# Patient Record
Sex: Male | Born: 1960 | Race: White | Hispanic: No | Smoking: Never smoker
Health system: Southern US, Community
[De-identification: ages and names within clinical notes are randomized; demographics above are authoritative.]

## PROBLEM LIST (undated history)

## (undated) DIAGNOSIS — E785 Hyperlipidemia, unspecified: Secondary | ICD-10-CM

## (undated) DIAGNOSIS — Z9889 Other specified postprocedural states: Secondary | ICD-10-CM

## (undated) DIAGNOSIS — N400 Enlarged prostate without lower urinary tract symptoms: Secondary | ICD-10-CM

## (undated) DIAGNOSIS — N529 Male erectile dysfunction, unspecified: Secondary | ICD-10-CM

## (undated) DIAGNOSIS — Z9289 Personal history of other medical treatment: Secondary | ICD-10-CM

## (undated) DIAGNOSIS — E291 Testicular hypofunction: Secondary | ICD-10-CM

## (undated) DIAGNOSIS — M545 Low back pain: Secondary | ICD-10-CM

## (undated) DIAGNOSIS — Z973 Presence of spectacles and contact lenses: Secondary | ICD-10-CM

## (undated) DIAGNOSIS — G8929 Other chronic pain: Secondary | ICD-10-CM

## (undated) DIAGNOSIS — J309 Allergic rhinitis, unspecified: Secondary | ICD-10-CM

## (undated) DIAGNOSIS — M75101 Unspecified rotator cuff tear or rupture of right shoulder, not specified as traumatic: Secondary | ICD-10-CM

## (undated) HISTORY — DX: Low back pain: M54.5

## (undated) HISTORY — DX: Other specified postprocedural states: Z98.890

## (undated) HISTORY — PX: WISDOM TOOTH EXTRACTION: SHX21

## (undated) HISTORY — DX: Allergic rhinitis, unspecified: J30.9

## (undated) HISTORY — DX: Testicular hypofunction: E29.1

## (undated) HISTORY — DX: Hyperlipidemia, unspecified: E78.5

## (undated) HISTORY — DX: Other chronic pain: G89.29

## (undated) HISTORY — DX: Benign prostatic hyperplasia without lower urinary tract symptoms: N40.0

---

## 1999-08-28 ENCOUNTER — Encounter: Payer: Self-pay | Admitting: *Deleted

## 1999-08-28 ENCOUNTER — Ambulatory Visit (HOSPITAL_COMMUNITY): Admission: RE | Admit: 1999-08-28 | Discharge: 1999-08-28 | Payer: Self-pay | Admitting: *Deleted

## 1999-09-06 ENCOUNTER — Encounter (INDEPENDENT_AMBULATORY_CARE_PROVIDER_SITE_OTHER): Payer: Self-pay | Admitting: Specialist

## 1999-09-06 ENCOUNTER — Inpatient Hospital Stay (HOSPITAL_COMMUNITY): Admission: RE | Admit: 1999-09-06 | Discharge: 1999-09-08 | Payer: Self-pay | Admitting: Orthopedic Surgery

## 1999-09-06 ENCOUNTER — Encounter: Payer: Self-pay | Admitting: Orthopedic Surgery

## 2000-07-20 HISTORY — PX: BACK SURGERY: SHX140

## 2000-07-20 HISTORY — PX: LUMBAR DISC SURGERY: SHX700

## 2004-10-28 ENCOUNTER — Ambulatory Visit: Payer: Self-pay

## 2006-11-23 ENCOUNTER — Encounter: Admission: RE | Admit: 2006-11-23 | Discharge: 2006-11-23 | Payer: Self-pay | Admitting: Family Medicine

## 2007-07-11 ENCOUNTER — Encounter: Payer: Self-pay | Admitting: Internal Medicine

## 2007-08-03 ENCOUNTER — Ambulatory Visit: Payer: Self-pay | Admitting: Internal Medicine

## 2007-08-03 DIAGNOSIS — E785 Hyperlipidemia, unspecified: Secondary | ICD-10-CM | POA: Insufficient documentation

## 2007-08-03 DIAGNOSIS — N400 Enlarged prostate without lower urinary tract symptoms: Secondary | ICD-10-CM | POA: Insufficient documentation

## 2007-08-03 DIAGNOSIS — J309 Allergic rhinitis, unspecified: Secondary | ICD-10-CM | POA: Insufficient documentation

## 2007-08-04 LAB — CONVERTED CEMR LAB
ALT: 31 units/L (ref 0–53)
AST: 25 units/L (ref 0–37)
Albumin: 4 g/dL (ref 3.5–5.2)
Alkaline Phosphatase: 70 units/L (ref 39–117)
BUN: 13 mg/dL (ref 6–23)
Bilirubin, Direct: 0.1 mg/dL (ref 0.0–0.3)
CO2: 29 meq/L (ref 19–32)
Calcium: 9.2 mg/dL (ref 8.4–10.5)
Chloride: 102 meq/L (ref 96–112)
Cholesterol: 147 mg/dL (ref 0–200)
Creatinine, Ser: 0.9 mg/dL (ref 0.4–1.5)
GFR calc Af Amer: 117 mL/min
GFR calc non Af Amer: 97 mL/min
Glucose, Bld: 68 mg/dL — ABNORMAL LOW (ref 70–99)
HDL: 34.9 mg/dL — ABNORMAL LOW (ref >39.0)
LDL Cholesterol: 83 mg/dL (ref 0–99)
Potassium: 4.4 meq/L (ref 3.5–5.1)
Sodium: 138 meq/L (ref 135–145)
Total Bilirubin: 1 mg/dL (ref 0.3–1.2)
Total CHOL/HDL Ratio: 4.2
Total Protein: 6.4 g/dL (ref 6.0–8.3)
Triglycerides: 144 mg/dL (ref 0–149)
VLDL: 29 mg/dL (ref 0–40)

## 2007-12-30 ENCOUNTER — Telehealth: Payer: Self-pay | Admitting: Internal Medicine

## 2008-02-16 ENCOUNTER — Ambulatory Visit: Payer: Self-pay | Admitting: Internal Medicine

## 2008-02-16 LAB — CONVERTED CEMR LAB
ALT: 28 units/L (ref 0–53)
AST: 27 units/L (ref 0–37)
Albumin: 4.1 g/dL (ref 3.5–5.2)
Alkaline Phosphatase: 63 units/L (ref 39–117)
Bilirubin, Direct: 0.1 mg/dL (ref 0.0–0.3)
Cholesterol: 137 mg/dL (ref 0–200)
HDL: 36.6 mg/dL — ABNORMAL LOW (ref >39.0)
LDL Cholesterol: 91 mg/dL (ref 0–99)
Total Bilirubin: 1 mg/dL (ref 0.3–1.2)
Total CHOL/HDL Ratio: 3.7
Total Protein: 6.7 g/dL (ref 6.0–8.3)
Triglycerides: 47 mg/dL (ref 0–149)
VLDL: 9 mg/dL (ref 0–40)

## 2008-02-22 ENCOUNTER — Ambulatory Visit: Payer: Self-pay | Admitting: Internal Medicine

## 2008-08-22 ENCOUNTER — Ambulatory Visit: Payer: Self-pay | Admitting: Internal Medicine

## 2008-11-30 ENCOUNTER — Encounter: Payer: Self-pay | Admitting: Internal Medicine

## 2008-12-19 ENCOUNTER — Ambulatory Visit: Payer: Self-pay | Admitting: Family Medicine

## 2008-12-27 ENCOUNTER — Telehealth: Payer: Self-pay | Admitting: Family Medicine

## 2009-11-06 ENCOUNTER — Telehealth: Payer: Self-pay | Admitting: Internal Medicine

## 2009-11-29 ENCOUNTER — Ambulatory Visit: Payer: Self-pay | Admitting: Family Medicine

## 2009-12-02 LAB — CONVERTED CEMR LAB
ALT: 28 units/L (ref 0–53)
AST: 28 units/L (ref 0–37)
Albumin: 4.2 g/dL (ref 3.5–5.2)
Alkaline Phosphatase: 72 units/L (ref 39–117)
BUN: 13 mg/dL (ref 6–23)
Basophils Absolute: 0 10*3/uL (ref 0.0–0.1)
Basophils Relative: 0.7 % (ref 0.0–3.0)
Bilirubin, Direct: 0.1 mg/dL (ref 0.0–0.3)
CO2: 27 meq/L (ref 19–32)
Calcium: 9.2 mg/dL (ref 8.4–10.5)
Chloride: 106 meq/L (ref 96–112)
Cholesterol: 158 mg/dL (ref 0–200)
Creatinine, Ser: 0.6 mg/dL (ref 0.4–1.5)
Eosinophils Absolute: 0.3 10*3/uL (ref 0.0–0.7)
Eosinophils Relative: 4.7 % (ref 0.0–5.0)
GFR calc non Af Amer: 146.76 mL/min (ref >60)
Glucose, Bld: 88 mg/dL (ref 70–99)
HCT: 42 % (ref 39.0–52.0)
HDL: 43.1 mg/dL (ref >39.00)
Hemoglobin: 14.3 g/dL (ref 13.0–17.0)
LDL Cholesterol: 97 mg/dL (ref 0–99)
Lymphocytes Relative: 28.1 % (ref 12.0–46.0)
Lymphs Abs: 1.5 10*3/uL (ref 0.7–4.0)
MCHC: 34.2 g/dL (ref 30.0–36.0)
MCV: 91.7 fL (ref 78.0–100.0)
Monocytes Absolute: 0.4 10*3/uL (ref 0.1–1.0)
Monocytes Relative: 7 % (ref 3.0–12.0)
Neutro Abs: 3.3 10*3/uL (ref 1.4–7.7)
Neutrophils Relative %: 59.5 % (ref 43.0–77.0)
Platelets: 209 10*3/uL (ref 150.0–400.0)
Potassium: 4.4 meq/L (ref 3.5–5.1)
RBC: 4.58 M/uL (ref 4.22–5.81)
RDW: 13.2 % (ref 11.5–14.6)
Sodium: 141 meq/L (ref 135–145)
TSH: 1.8 microintl units/mL (ref 0.35–5.50)
Testosterone: 189.08 ng/dL — ABNORMAL LOW (ref 350.00–890.00)
Total Bilirubin: 0.8 mg/dL (ref 0.3–1.2)
Total CHOL/HDL Ratio: 4
Total Protein: 6.7 g/dL (ref 6.0–8.3)
Triglycerides: 89 mg/dL (ref 0.0–149.0)
VLDL: 17.8 mg/dL (ref 0.0–40.0)
WBC: 5.5 10*3/uL (ref 4.5–10.5)

## 2009-12-04 ENCOUNTER — Ambulatory Visit: Payer: Self-pay | Admitting: Family Medicine

## 2009-12-04 DIAGNOSIS — E349 Endocrine disorder, unspecified: Secondary | ICD-10-CM | POA: Insufficient documentation

## 2010-01-22 ENCOUNTER — Telehealth: Payer: Self-pay | Admitting: Internal Medicine

## 2010-01-28 ENCOUNTER — Ambulatory Visit: Payer: Self-pay | Admitting: Internal Medicine

## 2010-01-28 LAB — CONVERTED CEMR LAB
Sex Hormone Binding: 26 nmol/L (ref 13–71)
Testosterone Free: 84.4 pg/mL (ref 47.0–244.0)
Testosterone-% Free: 2.3 % (ref 1.6–2.9)
Testosterone: 367.72 ng/dL (ref 350–890)

## 2010-01-29 LAB — CONVERTED CEMR LAB
FSH: 0.1 milliintl units/mL — ABNORMAL LOW (ref 1.4–18.1)
LH: 0.09 milliintl units/mL — ABNORMAL LOW (ref 1.50–9.30)
PSA: 0.54 ng/mL (ref 0.10–4.00)
Prolactin: 7 ng/mL

## 2010-02-10 ENCOUNTER — Ambulatory Visit: Payer: Self-pay | Admitting: Internal Medicine

## 2010-02-25 ENCOUNTER — Ambulatory Visit: Payer: Self-pay | Admitting: Internal Medicine

## 2010-03-11 ENCOUNTER — Ambulatory Visit: Payer: Self-pay | Admitting: Internal Medicine

## 2010-03-11 DIAGNOSIS — R209 Unspecified disturbances of skin sensation: Secondary | ICD-10-CM | POA: Insufficient documentation

## 2010-03-28 ENCOUNTER — Ambulatory Visit: Payer: Self-pay | Admitting: Internal Medicine

## 2010-05-01 ENCOUNTER — Ambulatory Visit: Payer: Self-pay | Admitting: Internal Medicine

## 2010-05-28 ENCOUNTER — Ambulatory Visit: Payer: Self-pay | Admitting: Internal Medicine

## 2010-06-27 ENCOUNTER — Ambulatory Visit: Payer: Self-pay | Admitting: Internal Medicine

## 2010-08-01 ENCOUNTER — Ambulatory Visit
Admission: RE | Admit: 2010-08-01 | Discharge: 2010-08-01 | Payer: Self-pay | Source: Home / Self Care | Attending: Internal Medicine | Admitting: Internal Medicine

## 2010-08-19 NOTE — Assessment & Plan Note (Signed)
Summary: 2 month rov/et---PT RSC (BMP) // RS   Vital Signs:  Patient profile:   50 year old male Height:      69.5 inches Weight:      169 pounds BMI:     24.69 Temp:     98.5 degrees F oral Pulse rate:   64 / minute Pulse rhythm:   regular BP sitting:   102 / 70  (left arm) Cuff size:   regular  Vitals Entered By: Kern Reap CMA Duncan Dull) (February 10, 2010 11:32 AM)  History of Present Illness:  Follow-Up Visit      This is a 50 year old man who presents for Follow-up visit.  The patient denies chest pain and palpitations.  Since the last visit the patient notes no new problems or concerns.  The patient reports taking meds as prescribed.  When questioned about possible medication side effects, the patient notes none.    All other systems reviewed and were negative   Current Problems (verified): 1)  Testicular Hypofunction  (ICD-257.2) 2)  Fatigue  (ICD-780.79) 3)  Sinusitis- Acute-nos  (ICD-461.9) 4)  Back Pain  (ICD-724.5) 5)  Benign Prostatic Hypertrophy  (ICD-600.00) 6)  Hyperlipidemia  (ICD-272.4) 7)  Allergic Rhinitis  (ICD-477.9)  Allergies (verified): No Known Drug Allergies  Physical Exam  General:  Well-developed,well-nourished,in no acute distress; alert,appropriate and cooperative throughout examination Head:  normocephalic and atraumatic.   Eyes:  pupils equal and pupils round.     Impression & Recommendations:  Problem # 1:  TESTICULAR HYPOFUNCTION (ICD-257.2) much improved  q 6 months PSA  Complete Medication List: 1)  Lovastatin 20 Mg Tabs (Lovastatin) .... Take 1 tablet by mouth once a day 2)  Diclofenac Sodium 75 Mg Tbec (Diclofenac sodium) .... Take 1 tablet by mouth two times a day 3)  Fexofenadine Hcl 180 Mg Tabs (Fexofenadine hcl) .... Take 1 tablet by mouth once a day 4)  Astelin 137 Mcg/spray Soln (Azelastine hcl) .... As needed 5)  Ambien 5 Mg Tabs (Zolpidem tartrate) .... Take 1 tablet by mouth at bedtime as needed insomnia 6)   Testosterone Cypionate 200 Mg/ml Oil (Testosterone cypionate) .Marland Kitchen.. 1cc intramuscularly monthly Prescriptions: TESTOSTERONE CYPIONATE 200 MG/ML OIL (TESTOSTERONE CYPIONATE) 1cc intramuscularly monthly  #10 cc x 0   Entered and Authorized by:   Birdie Sons MD   Signed by:   Birdie Sons MD on 02/10/2010   Method used:   Print then Give to Patient   RxID:   [id number]

## 2010-08-19 NOTE — Assessment & Plan Note (Signed)
Summary: testoserone inj/cdw  Nurse Visit   Allergies: No Known Drug Allergies  Medication Administration  Injection # 1:    Medication: Testosterone Cypionat 200mg  ing    Diagnosis: TESTICULAR HYPOFUNCTION (ICD-257.2)    Route: IM    Site: LUOQ gluteus    Exp Date: 07/2011    Lot #: [id number]    Mfr: Gaylyn Rong    Patient tolerated injection without complications    Given by: Alfred Levins, CMA (June 27, 2010 1:40 PM)  Orders Added: 1)  Admin of patients own med IM/SQ [phone]

## 2010-08-19 NOTE — Assessment & Plan Note (Signed)
Summary: testosterone inj/cjr  Nurse Visit   Allergies: No Known Drug Allergies  Medication Administration  Injection # 1:    Medication: Testosterone Cypionat 200mg  ing    Diagnosis: TESTICULAR HYPOFUNCTION (ICD-257.2)    Route: IM    Site: LUOQ gluteus    Exp Date: 08/20/2011    Lot #: [id number]    Mfr: [facility]    Patient tolerated injection without complications    Given by: Mervin Kung CMA Duncan Dull) (May 01, 2010 11:41 AM)  Orders Added: 1)  Admin of patients own med IM/SQ [phone]

## 2010-08-19 NOTE — Assessment & Plan Note (Signed)
Summary: testosterone/cdw  Nurse Visit   Allergies: No Known Drug Allergies  Medication Administration  Injection # 1:    Medication: Testosterone Cypionat 200mg  ing    Diagnosis: TESTICULAR HYPOFUNCTION (ICD-257.2)    Route: IM    Site: RUOQ gluteus    Exp Date: 07/2011    Lot #: [id number]    Mfr: Gaylyn Rong    Patient tolerated injection without complications    Given by: Alfred Levins, CMA (May 28, 2010 1:39 PM)  Orders Added: 1)  Testosterone Cypionat 200mg  ing [J1080] 2)  Admin of patients own med IM/SQ [[id number]]

## 2010-08-19 NOTE — Progress Notes (Signed)
Summary: question  Phone Note Call from Patient Call back at Work Phone [phone]   Caller: Patient via VM Call For: Birdie Sons MD Summary of Call: Is due FU OV for beginning androglide.  Wants to have lab work done week before.  Wants to know if gets PSA, testosterone, and what else. Initial call taken by: Gladis Riffle, RN,  January 22, 2010 7:43 AM  Follow-up for Phone Call        total and free testosterone prolactin, fsh, lh psa Follow-up by: Birdie Sons MD,  January 23, 2010 7:58 AM  Additional Follow-up for Phone Call Additional follow up Details #1::        Patient notified. Appts made for lab 7/12 and rov 7/18. Additional Follow-up by: Gladis Riffle, RN,  January 23, 2010 11:01 AM

## 2010-08-19 NOTE — Assessment & Plan Note (Signed)
Summary: TEST RESULT F/U O.K. PER DR.//ALP   Vital Signs:  Patient profile:   50 year old male Temp:     98.7 degrees F oral BP sitting:   98 / 62  (left arm) Cuff size:   regular  Vitals Entered By: Sid Falcon LPN (Dec 04, 2009 5:01 PM)  History of Present Illness: Recent issue of progressive fatigue over several months and possibly 2 years. Also symptoms of decrease in stamina, strength, libido, and some erectile dysfunction.  Several recent labs signif for low total testosterone of 189. Pt  instructed to f/u with primary but could not get in anytime soon and scheduled here today.  Allergies (verified): No Known Drug Allergies  Past History:  Past Medical History: Last updated: 08/03/2007 Allergic rhinitis-allergy shots Hyperlipidemia Benign prostatic hypertrophy Chronic back pain  Social History: Last updated: 08/03/2007 Regular exercise-yes-mostly walking/yoga Occupation: [employer] Married Never Smoked Alcohol use-yes  Review of Systems      See HPI  Physical Exam  General:  Well-developed,well-nourished,in no acute distress; alert,appropriate and cooperative throughout examination Lungs:  Normal respiratory effort, chest expands symmetrically. Lungs are clear to auscultation, no crackles or wheezes. Heart:  Normal rate and regular rhythm. S1 and S2 normal without gallop, murmur, click, rub or other extra sounds.   Impression & Recommendations:  Problem # 1:  TESTICULAR HYPOFUNCTION (ICD-257.2) Options discussed with pt.  He has several symptoms likely compatible with testicular hypofunction. Pt wishes to try topical testosterone and will start Androgel pump 4 sprays/day and he will need f/u in 2  months to reassess. Encouraged to get PSA when he gets PE this year.  Complete Medication List: 1)  Lovastatin 20 Mg Tabs (Lovastatin) .... Take 1 tablet by mouth once a day 2)  Diclofenac Sodium 75 Mg Tbec (Diclofenac sodium) .... Take 1 tablet by  mouth two times a day 3)  Fexofenadine Hcl 180 Mg Tabs (Fexofenadine hcl) .... Take 1 tablet by mouth once a day 4)  Astelin 137 Mcg/spray Soln (Azelastine hcl) .... As needed 5)  Ambien 5 Mg Tabs (Zolpidem tartrate) .... Take 1 tablet by mouth at bedtime as needed insomnia 6)  Androgel Pump 1 % Gel (Testosterone) .... 4 sprays per pump and apply daily as instructed.  Patient Instructions: 1)  Please schedule a follow-up appointment in 2 months.  Prescriptions: ANDROGEL PUMP 1 % GEL (TESTOSTERONE) 4 sprays per pump and apply daily as instructed.  #1 x 3   Entered and Authorized by:   Evelena Peat MD   Signed by:   Evelena Peat MD on 12/04/2009   Method used:   Print then Give to Patient   RxID:   [id number]

## 2010-08-19 NOTE — Assessment & Plan Note (Signed)
Summary: test injection - rv  Nurse Visit   Allergies: No Known Drug Allergies  Medication Administration  Injection # 1:    Medication: Testosterone Cypionat 200mg  ing    Diagnosis: TESTICULAR HYPOFUNCTION (ICD-257.2)    Route: IM    Site: RUOQ gluteus    Exp Date: 07/21/2011    Lot #: [id number]    Mfr: Gaylyn Rong     Patient tolerated injection without complications    Given by: Gladis Riffle, RN (February 25, 2010 1:39 PM)  Orders Added: 1)  Admin of patients own med IM/SQ [[id number]]   Medication Administration  Injection # 1:    Medication: Testosterone Cypionat 200mg  ing    Diagnosis: TESTICULAR HYPOFUNCTION (ICD-257.2)    Route: IM    Site: RUOQ gluteus    Exp Date: 07/21/2011    Lot #: [id number]    Mfr: Gaylyn Rong     Patient tolerated injection without complications    Given by: Gladis Riffle, RN (February 25, 2010 1:39 PM)  Orders Added: 1)  Admin of patients own med IM/SQ [phone]

## 2010-08-19 NOTE — Assessment & Plan Note (Signed)
Summary: tingling in hands/dm   Vital Signs:  Patient profile:   50 year old male Weight:      175 pounds BMI:     25.56 Pulse rate:   72 / minute Pulse rhythm:   regular Resp:     12 per minute BP sitting:   100 / 64  (left arm) Cuff size:   regular  Vitals Entered By: Gladis Riffle, RN (March 11, 2010 11:56 AM) CC: c/o tingling left hand x 4 weeks Is Patient Diabetic? No   CC:  c/o tingling left hand x 4 weeks.  History of Present Illness: 3-4 week hx of tingling left hand 1 and 2 fingers---can extend to elbow not painful most noticeable a.m. no shoulder pain he does admit to some left sided neck pain sxs do not wake hem from sleep   no real associated sxs but thinks maybe when sits in meetings for prolonged periods of time he has slight tingling no motor/muscular complaints   otherwise feels well  Preventive Screening-Counseling & Management  Alcohol-Tobacco     Alcohol drinks/day: <1     Smoking Status: never  Current Problems (verified): 1)  Testicular Hypofunction  (ICD-257.2) 2)  Benign Prostatic Hypertrophy  (ICD-600.00) 3)  Hyperlipidemia  (ICD-272.4) 4)  Allergic Rhinitis  (ICD-477.9)  Current Medications (verified): 1)  Lovastatin 20 Mg  Tabs (Lovastatin) .... Take 1 Tablet By Mouth Once A Day 2)  Diclofenac Sodium 75 Mg  Tbec (Diclofenac Sodium) .... Take 1 Tablet By Mouth Two Times A Day 3)  Fexofenadine Hcl 180 Mg  Tabs (Fexofenadine Hcl) .... Take 1 Tablet By Mouth Once A Day 4)  Astelin 137 Mcg/spray  Soln (Azelastine Hcl) .... As Needed 5)  Ambien 5 Mg Tabs (Zolpidem Tartrate) .... Take 1 Tablet By Mouth At Bedtime As Needed Insomnia 6)  Testosterone Cypionate 200 Mg/ml Oil (Testosterone Cypionate) .Marland Kitchen.. 1cc Intramuscularly Monthly  Allergies (verified): No Known Drug Allergies  Past History:  Past Medical History: Last updated: 08/03/2007 Allergic rhinitis-allergy shots Hyperlipidemia Benign prostatic hypertrophy Chronic back  pain  Past Surgical History: Last updated: 08/03/2007 back surgery  ~2002  Family History: Last updated: 08/03/2007 father CAD/CABG-dianosed in 66s grandfather CAD mother deceased-ALS, RA 61yo sisters-healthy paternal grandmother---colon CA  Social History: Last updated: 08/03/2007 Regular exercise-yes-mostly walking/yoga Occupation: [employer] Married Never Smoked Alcohol use-yes  Risk Factors: Alcohol Use: <1 (03/11/2010) Exercise: yes (08/03/2007)  Risk Factors: Smoking Status: never (03/11/2010)  Physical Exam  General:  Well-developed,well-nourished,in no acute distress; alert,appropriate and cooperative throughout examination Head:  normocephalic and atraumatic.   Eyes:  pupils equal and pupils round.   Ears:  R ear normal and L ear normal.   Neck:  No deformities, masses, or tenderness noted. Lungs:  Normal respiratory effort, chest expands symmetrically. Lungs are clear to auscultation, no crackles or wheezes. Heart:  normal rate and regular rhythm.   Msk:  No deformity or scoliosis noted of thoracic or lumbar spine.   Neurologic:  negative Tinel sign Skin:  turgor normal and color normal.     Impression & Recommendations:  Problem # 1:  PARESTHESIA, HANDS (ICD-782.0) possible carpal tunnel syndrome. Advised wrist braces. He will call if symptoms persist. other strong possibility is particular neck pain with paresthesias. No evaluation at this time unless symptoms worsen or he develops motor complaints.  Complete Medication List: 1)  Lovastatin 20 Mg Tabs (Lovastatin) .... Take 1 tablet by mouth once a day 2)  Diclofenac Sodium 75 Mg Tbec (Diclofenac sodium) .Marland KitchenMarland KitchenMarland Kitchen  Take 1 tablet by mouth two times a day 3)  Fexofenadine Hcl 180 Mg Tabs (Fexofenadine hcl) .... Take 1 tablet by mouth once a day 4)  Astelin 137 Mcg/spray Soln (Azelastine hcl) .... As needed 5)  Ambien 5 Mg Tabs (Zolpidem tartrate) .... Take 1 tablet by mouth at bedtime as needed  insomnia 6)  Testosterone Cypionate 200 Mg/ml Oil (Testosterone cypionate) .Marland Kitchen.. 1cc intramuscularly monthly

## 2010-08-19 NOTE — Assessment & Plan Note (Signed)
Summary: TESTOSTERONE INJ/KB  Nurse Visit   Allergies: No Known Drug Allergies  Medication Administration  Injection # 1:    Medication: Testosterone Cypionat 200mg  ing    Diagnosis: TESTICULAR HYPOFUNCTION (ICD-257.2)    Route: IM    Site: RUOQ gluteus    Exp Date: 07/21/2011    Lot #: [id number]    Mfr: Gaylyn Rong    Patient tolerated injection without complications    Given by: Lynann Beaver CMA (March 28, 2010 2:02 PM)  Orders Added: 1)  Admin of patients own med IM/SQ [phone]

## 2010-08-19 NOTE — Progress Notes (Signed)
Summary: Pt has questions re: Ambien CR. Pls call back asap  Phone Note Call from Patient Call back at Work Phone [phone] Call back at [phone] cell   Caller: Patient Summary of Call: Pt called re: Ambien CR. Pt has never had this med and wants to have a call back to discuss.  Initial call taken by: Lucy Antigua,  November 06, 2009 1:48 PM  Follow-up for Phone Call        Getting to go on strange hours for May, June, and July.  Request ambien cr #30/0 to [facility] [address] to cover this.  willing to come for ov if needs. Follow-up by: Gladis Riffle, RN,  November 06, 2009 2:04 PM  Additional Follow-up for Phone Call Additional follow up Details #1::        ambien 5 mg at bedtime as needed insomnia 30/0 Additional Follow-up by: Birdie Sons MD,  November 07, 2009 12:40 PM    Additional Follow-up for Phone Call Additional follow up Details #2::    see Rx. Patient notified.  Follow-up by: Gladis Riffle, RN,  November 07, 2009 1:31 PM  New/Updated Medications: AMBIEN 5 MG TABS (ZOLPIDEM TARTRATE) Take 1 tablet by mouth at bedtime as needed insomnia Prescriptions: AMBIEN 5 MG TABS (ZOLPIDEM TARTRATE) Take 1 tablet by mouth at bedtime as needed insomnia  #30 x 0   Entered by:   Gladis Riffle, RN   Authorized by:   Birdie Sons MD   Signed by:   Gladis Riffle, RN on 11/07/2009   Method used:   Telephoned to ...       [facility]  [address]  [phone]* (retail)       [address]       [place], [place]  [address]       Ph: [phone] or [phone]       Fax: [phone]   RxID:   [id number]

## 2010-08-19 NOTE — Assessment & Plan Note (Signed)
Summary: not feeling well//ccm   Vital Signs:  Patient profile:   50 year old male Temp:     97.9 degrees F oral BP sitting:   110 / 82  (left arm) Cuff size:   regular  Vitals Entered By: Sid Falcon LPN (Nov 29, 2009 8:30 AM) CC: Lethargic, fatigue X 2 years   History of Present Illness: Patient seen with general complaint of fatigue progressive over 2 years.  Patient has been exercising regularly without much improvement and focusing on plenty of sleep and fluid intake. Denies any appetite or weight changes. Has noticed some decreased libido and erectile dysfunction intermittently. Possibly some generalized decrease in strength.  Patient denies depressive symptoms. No increase in stressors.  Chronic problems include history of some chronic back pain and hyperlipidemia. On lovastatin and diclofenac with no recent hepatic panel.  No recent decrease in exercise tolerance. Denies any chest pains or dyspnea.  Allergies (verified): No Known Drug Allergies  Past History:  Past Medical History: Last updated: 08/03/2007 Allergic rhinitis-allergy shots Hyperlipidemia Benign prostatic hypertrophy Chronic back pain  Past Surgical History: Last updated: 08/03/2007 back surgery  ~2002  Family History: Last updated: 08/03/2007 father CAD/CABG-dianosed in 19s grandfather CAD mother deceased-ALS, RA 86yo sisters-healthy paternal grandmother---colon CA  Social History: Last updated: 08/03/2007 Regular exercise-yes-mostly walking/yoga Occupation: [employer] Married Never Smoked Alcohol use-yes  Risk Factors: Alcohol Use: <1 (08/03/2007) Exercise: yes (08/03/2007)  Risk Factors: Smoking Status: never (08/03/2007)  Review of Systems  The patient denies anorexia, fever, weight loss, vision loss, chest pain, syncope, dyspnea on exertion, peripheral edema, prolonged cough, headaches, hemoptysis, abdominal pain, melena, hematochezia, severe indigestion/heartburn,  hematuria, incontinence, suspicious skin lesions, depression, and enlarged lymph nodes.    Physical Exam  General:  Well-developed,well-nourished,in no acute distress; alert,appropriate and cooperative throughout examination Head:  Normocephalic and atraumatic without obvious abnormalities. No apparent alopecia or balding. Eyes:  No corneal or conjunctival inflammation noted. EOMI. Perrla. Funduscopic exam benign, without hemorrhages, exudates or papilledema. Vision grossly normal. Mouth:  Oral mucosa and oropharynx without lesions or exudates.  Teeth in good repair. Neck:  No deformities, masses, or tenderness noted. Lungs:  Normal respiratory effort, chest expands symmetrically. Lungs are clear to auscultation, no crackles or wheezes. Heart:  Normal rate and regular rhythm. S1 and S2 normal without gallop, murmur, click, rub or other extra sounds. Abdomen:  Bowel sounds positive,abdomen soft and non-tender without masses, organomegaly or hernias noted. Extremities:  No clubbing, cyanosis, edema, or deformity noted with normal full range of motion of all joints.   Skin:  Intact without suspicious lesions or rashes Psych:  normally interactive, good eye contact, not anxious appearing, and not depressed appearing.     Impression & Recommendations:  Problem # 1:  FATIGUE (ICD-780.79) rule out metabolic issue.  No evidence for depression. Orders: Venipuncture (56213) TLB-CBC Platelet - w/Differential (85025-CBCD) TLB-BMP (Basic Metabolic Panel-BMET) (80048-METABOL) TLB-TSH (Thyroid Stimulating Hormone) (84443-TSH) TLB-Testosterone, Total (84403-TESTO)  Problem # 2:  HYPERLIPIDEMIA (ICD-272.4)  His updated medication list for this problem includes:    Lovastatin 20 Mg Tabs (Lovastatin) .Marland Kitchen... Take 1 tablet by mouth once a day  Orders: Venipuncture (08657) TLB-Lipid Panel (80061-LIPID) TLB-Hepatic/Liver Function Pnl (80076-HEPATIC)  Complete Medication List: 1)  Lovastatin 20 Mg Tabs  (Lovastatin) .... Take 1 tablet by mouth once a day 2)  Diclofenac Sodium 75 Mg Tbec (Diclofenac sodium) .... Take 1 tablet by mouth two times a day 3)  Fexofenadine Hcl 180 Mg Tabs (Fexofenadine hcl) .... Take 1 tablet  by mouth once a day 4)  Astelin 137 Mcg/spray Soln (Azelastine hcl) .... As needed 5)  Ambien 5 Mg Tabs (Zolpidem tartrate) .... Take 1 tablet by mouth at bedtime as needed insomnia  Patient Instructions: 1)  It is important that you exercise reguarly at least 20 minutes 5 times a week. If you develop chest pain, have severe difficulty breathing, or feel very tired, stop exercising immediately and seek medical attention.

## 2010-08-21 NOTE — Assessment & Plan Note (Signed)
Summary: TESTESTERONE INJ//SLM  Nurse Visit   Allergies: No Known Drug Allergies

## 2010-08-29 ENCOUNTER — Ambulatory Visit: Payer: 59 | Admitting: Internal Medicine

## 2010-08-29 DIAGNOSIS — E349 Endocrine disorder, unspecified: Secondary | ICD-10-CM

## 2010-08-29 DIAGNOSIS — E291 Testicular hypofunction: Secondary | ICD-10-CM

## 2010-08-29 LAB — TESTOSTERONE: Testosterone: 128.64 ng/dL — ABNORMAL LOW (ref 350.00–890.00)

## 2010-08-29 MED ORDER — TESTOSTERONE CYPIONATE 200 MG/ML IM SOLN
200.0000 mg | INTRAMUSCULAR | Status: DC
  Administered 2010-08-29 – 2010-12-16 (×5): 200 mg via INTRAMUSCULAR

## 2010-09-09 NOTE — Progress Notes (Signed)
LMTCB

## 2010-09-20 ENCOUNTER — Encounter: Payer: Self-pay | Admitting: Internal Medicine

## 2010-09-22 ENCOUNTER — Ambulatory Visit (INDEPENDENT_AMBULATORY_CARE_PROVIDER_SITE_OTHER): Payer: 59 | Admitting: Internal Medicine

## 2010-09-22 DIAGNOSIS — E291 Testicular hypofunction: Secondary | ICD-10-CM

## 2010-09-27 ENCOUNTER — Other Ambulatory Visit: Payer: Self-pay | Admitting: Internal Medicine

## 2010-10-13 ENCOUNTER — Ambulatory Visit: Payer: 59 | Admitting: Internal Medicine

## 2010-11-03 ENCOUNTER — Ambulatory Visit (INDEPENDENT_AMBULATORY_CARE_PROVIDER_SITE_OTHER): Payer: 59 | Admitting: Internal Medicine

## 2010-11-03 DIAGNOSIS — E291 Testicular hypofunction: Secondary | ICD-10-CM

## 2010-11-04 ENCOUNTER — Other Ambulatory Visit: Payer: Self-pay | Admitting: Internal Medicine

## 2010-11-24 ENCOUNTER — Ambulatory Visit (INDEPENDENT_AMBULATORY_CARE_PROVIDER_SITE_OTHER): Payer: 59 | Admitting: Internal Medicine

## 2010-11-24 DIAGNOSIS — E291 Testicular hypofunction: Secondary | ICD-10-CM

## 2010-12-16 ENCOUNTER — Ambulatory Visit (INDEPENDENT_AMBULATORY_CARE_PROVIDER_SITE_OTHER): Payer: 59 | Admitting: Internal Medicine

## 2010-12-16 ENCOUNTER — Telehealth: Payer: Self-pay | Admitting: *Deleted

## 2010-12-16 DIAGNOSIS — E349 Endocrine disorder, unspecified: Secondary | ICD-10-CM

## 2010-12-16 DIAGNOSIS — E291 Testicular hypofunction: Secondary | ICD-10-CM

## 2010-12-16 MED ORDER — TESTOSTERONE 20.25 MG/ACT (1.62%) TD GEL: 2.0000 | Freq: Every day | TRANSDERMAL | Status: DC

## 2010-12-16 NOTE — Telephone Encounter (Signed)
Pt would like to go back on the Androgel.  Injections are to expensive

## 2010-12-16 NOTE — Telephone Encounter (Signed)
rx called in, pt aware 

## 2010-12-16 NOTE — Telephone Encounter (Signed)
Ok. androgel-1.62%. 2 pumps applied to shoulders daily

## 2010-12-23 ENCOUNTER — Other Ambulatory Visit: Payer: Self-pay | Admitting: Internal Medicine

## 2011-01-22 ENCOUNTER — Other Ambulatory Visit: Payer: Self-pay | Admitting: Internal Medicine

## 2011-02-24 ENCOUNTER — Other Ambulatory Visit: Payer: Self-pay | Admitting: Internal Medicine

## 2011-05-31 ENCOUNTER — Other Ambulatory Visit: Payer: Self-pay | Admitting: Internal Medicine

## 2011-06-03 ENCOUNTER — Telehealth: Payer: Self-pay | Admitting: Internal Medicine

## 2011-06-03 MED ORDER — DICLOFENAC SODIUM 75 MG PO TBEC: 75.0000 mg | DELAYED_RELEASE_TABLET | Freq: Two times a day (BID) | ORAL | Status: DC

## 2011-06-03 NOTE — Telephone Encounter (Signed)
rx called in to [facility], pt aware

## 2011-06-03 NOTE — Telephone Encounter (Signed)
Pt has schd fasting cpx labs on 06/15/11 and cpx on 06/22/11. Pt is req a refill of diclofenac (VOLTAREN) 75 MG EC tablet to last until his ov. Pt says that either a 30day supply can be called in to [facility] [address] or send full script in to [facility]. Pls call pt and let him know status.

## 2011-06-15 ENCOUNTER — Other Ambulatory Visit (INDEPENDENT_AMBULATORY_CARE_PROVIDER_SITE_OTHER): Payer: 59

## 2011-06-15 DIAGNOSIS — Z0000 Encounter for general adult medical examination without abnormal findings: Secondary | ICD-10-CM

## 2011-06-15 LAB — LIPID PANEL: Total CHOL/HDL Ratio: 4

## 2011-06-15 LAB — POCT URINALYSIS DIPSTICK
Bilirubin, UA: NEGATIVE
Glucose, UA: NEGATIVE
Leukocytes, UA: NEGATIVE
Nitrite, UA: NEGATIVE
pH, UA: 7.5

## 2011-06-15 LAB — BASIC METABOLIC PANEL
Calcium: 9.4 mg/dL (ref 8.4–10.5)
Chloride: 105 mEq/L (ref 96–112)
Creatinine, Ser: 0.9 mg/dL (ref 0.4–1.5)

## 2011-06-15 LAB — CBC WITH DIFFERENTIAL/PLATELET
Basophils Relative: 0.4 % (ref 0.0–3.0)
Eosinophils Relative: 3.2 % (ref 0.0–5.0)
Lymphocytes Relative: 16.9 % (ref 12.0–46.0)
Neutrophils Relative %: 72.6 % (ref 43.0–77.0)
RBC: 4.96 Mil/uL (ref 4.22–5.81)
WBC: 8.8 10*3/uL (ref 4.5–10.5)

## 2011-06-15 LAB — HEPATIC FUNCTION PANEL
ALT: 29 U/L (ref 0–53)
AST: 29 U/L (ref 0–37)
Alkaline Phosphatase: 75 U/L (ref 39–117)
Bilirubin, Direct: 0.2 mg/dL (ref 0.0–0.3)
Total Protein: 7.1 g/dL (ref 6.0–8.3)

## 2011-06-22 ENCOUNTER — Encounter: Payer: Self-pay | Admitting: Internal Medicine

## 2011-06-22 ENCOUNTER — Ambulatory Visit (INDEPENDENT_AMBULATORY_CARE_PROVIDER_SITE_OTHER): Payer: 59 | Admitting: Internal Medicine

## 2011-06-22 VITALS — BP 114/68 | HR 76 | Temp 98.4°F | Ht 69.25 in | Wt 174.0 lb

## 2011-06-22 DIAGNOSIS — Z0000 Encounter for general adult medical examination without abnormal findings: Secondary | ICD-10-CM

## 2011-06-22 DIAGNOSIS — E291 Testicular hypofunction: Secondary | ICD-10-CM

## 2011-06-22 MED ORDER — TESTOSTERONE CYPIONATE 200 MG/ML IM SOLN: 200.0000 mg | INTRAMUSCULAR | Status: DC

## 2011-06-22 NOTE — Assessment & Plan Note (Signed)
Still with low testosterone

## 2011-06-22 NOTE — Progress Notes (Signed)
  Subjective:    Patient ID: Kenneth Knapp, male    DOB: [birth year], 50 y.o.   MRN: [id number]  HPI  cpx  Past Medical History  Diagnosis Date  . ALLERGIC RHINITIS 08/03/2007  . BENIGN PROSTATIC HYPERTROPHY 08/03/2007  . HYPERLIPIDEMIA 08/03/2007  . TESTICULAR HYPOFUNCTION 12/04/2009  . Chronic low back pain    Past Surgical History  Procedure Date  . Back surgery 2002    reports that he has never smoked. He does not have any smokeless tobacco history on file. He reports that he drinks alcohol. His drug history not on file. family history includes ALS in his mother; Arthritis in his mother; Colon cancer in his paternal grandmother; and Heart disease in his father and paternal grandfather. No Known Allergies   Review of Systems  patient denies chest pain, shortness of breath, orthopnea. Denies lower extremity edema, abdominal pain, change in appetite, change in bowel movements. Patient denies rashes, musculoskeletal complaints. No other specific complaints in a complete review of systems.      Objective:   Physical Exam  Well-developed male in no acute distress. HEENT exam atraumatic, normocephalic, extraocular muscles are intact. Conjunctivae are pink without exudate. Neck is supple without lymphadenopathy, thyromegaly, jugular venous distention. Chest is clear to auscultation without increased work of breathing. Cardiac exam S1-S2 are regular. The PMI is normal. No significant murmurs or gallops. Abdominal exam active bowel sounds, soft, nontender. No abdominal bruits. Extremities no clubbing cyanosis or edema. Peripheral pulses are normal without bruits. Neurologic exam alert and oriented without any motor or sensory deficits.       Assessment & Plan:  Well Visit--health maint UTD

## 2011-06-24 ENCOUNTER — Other Ambulatory Visit: Payer: Self-pay | Admitting: Internal Medicine

## 2011-06-29 ENCOUNTER — Ambulatory Visit (INDEPENDENT_AMBULATORY_CARE_PROVIDER_SITE_OTHER): Payer: 59 | Admitting: Internal Medicine

## 2011-06-29 ENCOUNTER — Other Ambulatory Visit: Payer: Self-pay | Admitting: *Deleted

## 2011-06-29 DIAGNOSIS — E291 Testicular hypofunction: Secondary | ICD-10-CM

## 2011-06-29 DIAGNOSIS — E349 Endocrine disorder, unspecified: Secondary | ICD-10-CM

## 2011-06-29 MED ORDER — TESTOSTERONE CYPIONATE 200 MG/ML IM SOLN
200.0000 mg | Freq: Once | INTRAMUSCULAR | Status: AC
  Administered 2011-06-29: 200 mg via INTRAMUSCULAR

## 2011-07-20 ENCOUNTER — Ambulatory Visit (INDEPENDENT_AMBULATORY_CARE_PROVIDER_SITE_OTHER): Payer: 59 | Admitting: *Deleted

## 2011-07-20 DIAGNOSIS — E291 Testicular hypofunction: Secondary | ICD-10-CM

## 2011-07-20 DIAGNOSIS — E349 Endocrine disorder, unspecified: Secondary | ICD-10-CM

## 2011-07-21 HISTORY — PX: COLONOSCOPY: SHX174

## 2011-07-22 ENCOUNTER — Encounter: Payer: Self-pay | Admitting: Internal Medicine

## 2011-07-22 MED ORDER — TESTOSTERONE CYPIONATE 200 MG/ML IM SOLN
200.0000 mg | INTRAMUSCULAR | Status: DC
  Administered 2011-07-20 – 2011-08-10 (×2): 200 mg via INTRAMUSCULAR

## 2011-07-26 ENCOUNTER — Other Ambulatory Visit: Payer: Self-pay | Admitting: Internal Medicine

## 2011-07-27 ENCOUNTER — Other Ambulatory Visit: Payer: Self-pay | Admitting: *Deleted

## 2011-07-27 MED ORDER — ZOLPIDEM TARTRATE 5 MG PO TABS: 5.0000 mg | ORAL_TABLET | Freq: Every evening | ORAL | Status: DC | PRN

## 2011-08-04 ENCOUNTER — Ambulatory Visit (AMBULATORY_SURGERY_CENTER): Payer: 59

## 2011-08-04 ENCOUNTER — Encounter: Payer: Self-pay | Admitting: Internal Medicine

## 2011-08-04 VITALS — Ht 71.0 in | Wt 182.7 lb

## 2011-08-04 DIAGNOSIS — Z1211 Encounter for screening for malignant neoplasm of colon: Secondary | ICD-10-CM

## 2011-08-04 DIAGNOSIS — Z800 Family history of malignant neoplasm of digestive organs: Secondary | ICD-10-CM

## 2011-08-04 MED ORDER — PEG-KCL-NACL-NASULF-NA ASC-C 100 G PO SOLR: 1.0000 | Freq: Once | ORAL | Status: AC

## 2011-08-04 NOTE — Progress Notes (Signed)
Pt came into the office today for his previsit prior to his colonoscopy on 08/17/11 with Dr Rhea Belton. Pt did request to have propofol for sedation instead of Versed and Fentanyl and was already scheduled on a propofol day. The propofol information sheet was given to pt and his questions were answered. Ulis Rias RN

## 2011-08-10 ENCOUNTER — Ambulatory Visit (INDEPENDENT_AMBULATORY_CARE_PROVIDER_SITE_OTHER): Payer: 59 | Admitting: Internal Medicine

## 2011-08-10 DIAGNOSIS — E291 Testicular hypofunction: Secondary | ICD-10-CM

## 2011-08-17 ENCOUNTER — Ambulatory Visit (AMBULATORY_SURGERY_CENTER): Payer: 59 | Admitting: Internal Medicine

## 2011-08-17 ENCOUNTER — Encounter: Payer: Self-pay | Admitting: Internal Medicine

## 2011-08-17 DIAGNOSIS — Z800 Family history of malignant neoplasm of digestive organs: Secondary | ICD-10-CM

## 2011-08-17 DIAGNOSIS — Z1211 Encounter for screening for malignant neoplasm of colon: Secondary | ICD-10-CM

## 2011-08-17 MED ORDER — SODIUM CHLORIDE 0.9 % IV SOLN: 500.0000 mL | INTRAVENOUS | Status: DC

## 2011-08-17 NOTE — Patient Instructions (Signed)
FOLLOW DISCHARGE INSTRUCTIONS (BLUE AND GREEN SHEETS).. 

## 2011-08-17 NOTE — Progress Notes (Signed)
Patient did not experience any of the following events: a burn prior to discharge; a fall within the facility; wrong site/side/patient/procedure/implant event; or a hospital transfer or hospital admission upon discharge from the facility. (G8907) Patient did not have preoperative order for IV antibiotic SSI prophylaxis. (G8918)  

## 2011-08-17 NOTE — Op Note (Signed)
[facility] [address] [place], [place]  [address]  COLONOSCOPY PROCEDURE REPORT  PATIENT:  Kenneth Knapp, Kenneth Knapp  MR#:  [id number] BIRTHDATE:  11/19/1960, 50 yrs. old  GENDER:  male ENDOSCOPIST:  Carie Caddy. Alanys Godino, MD REF. BY:  Birdie Sons, M.D. PROCEDURE DATE:  08/17/2011 PROCEDURE:  Colonoscopy 78295 ASA CLASS:  Class I INDICATIONS:  Routine Risk Screening (1st colonoscopy) MEDICATIONS:   propofol (Diprivan) 150 mg IV, MAC sedation, administered by CRNA  DESCRIPTION OF PROCEDURE:   After the risks benefits and alternatives of the procedure were thoroughly explained, informed consent was obtained.  Digital rectal exam was performed and revealed no rectal masses.   The LB 180AL [id number] endoscope was introduced through the anus and advanced to the cecum, which was identified by both the appendix and ileocecal valve, without limitations.  The quality of the prep was good, using MoviPrep. The instrument was then slowly withdrawn as the colon was fully examined. <<PROCEDUREIMAGES>>  FINDINGS:  Normal colonoscopy without polyps, masses, vascular ectasias, or inflammatory changes.   Retroflexed views in the rectum revealed no abnormalities.   The scope was then withdrawn from the cecum and the procedure completed.  COMPLICATIONS:  None  ENDOSCOPIC IMPRESSION: 1) Normal colonoscopy  RECOMMENDATIONS: 1) You should continue to follow colorectal cancer screening guidelines for "routine risk" patients with a repeat colonoscopy in 10 years. There is no need for FOBT (stool) testing for at least 5 years.  Carie Caddy. Rhea Belton, MD  CC:  [name], MD The Patient  n. eSIGNEDCarie Caddy. Kalijah Westfall at 08/17/2011 08:49 AM  [name], [phone]

## 2011-08-18 ENCOUNTER — Telehealth: Payer: Self-pay | Admitting: *Deleted

## 2011-08-18 NOTE — Telephone Encounter (Signed)
  Follow up Call-  Call back number 08/17/2011  Post procedure Call Back phone  # [phone]  Permission to leave phone message Yes     Patient questions:  Do you have a fever, pain , or abdominal swelling? no Pain Score  0 *  Have you tolerated food without any problems? yes  Have you been able to return to your normal activities? yes  Do you have any questions about your discharge instructions: Diet   no Medications  no Follow up visit  no  Do you have questions or concerns about your Care? no  Actions: * If pain score is 4 or above: No action needed, pain <4.

## 2011-08-31 ENCOUNTER — Ambulatory Visit (INDEPENDENT_AMBULATORY_CARE_PROVIDER_SITE_OTHER): Payer: 59 | Admitting: Internal Medicine

## 2011-08-31 ENCOUNTER — Ambulatory Visit: Payer: 59 | Admitting: Internal Medicine

## 2011-08-31 DIAGNOSIS — E291 Testicular hypofunction: Secondary | ICD-10-CM

## 2011-08-31 DIAGNOSIS — E349 Endocrine disorder, unspecified: Secondary | ICD-10-CM

## 2011-08-31 MED ORDER — TESTOSTERONE CYPIONATE 200 MG/ML IM SOLN
200.0000 mg | INTRAMUSCULAR | Status: DC
  Administered 2011-08-31 – 2011-11-03 (×4): 200 mg via INTRAMUSCULAR

## 2011-09-21 ENCOUNTER — Ambulatory Visit (INDEPENDENT_AMBULATORY_CARE_PROVIDER_SITE_OTHER): Payer: 59 | Admitting: Internal Medicine

## 2011-09-21 DIAGNOSIS — E291 Testicular hypofunction: Secondary | ICD-10-CM

## 2011-10-04 ENCOUNTER — Other Ambulatory Visit: Payer: Self-pay | Admitting: Internal Medicine

## 2011-10-12 ENCOUNTER — Ambulatory Visit (INDEPENDENT_AMBULATORY_CARE_PROVIDER_SITE_OTHER): Payer: 59 | Admitting: Internal Medicine

## 2011-10-12 DIAGNOSIS — E291 Testicular hypofunction: Secondary | ICD-10-CM

## 2011-11-03 ENCOUNTER — Ambulatory Visit (INDEPENDENT_AMBULATORY_CARE_PROVIDER_SITE_OTHER): Payer: 59 | Admitting: Internal Medicine

## 2011-11-03 DIAGNOSIS — E291 Testicular hypofunction: Secondary | ICD-10-CM

## 2011-11-03 MED ORDER — TESTOSTERONE CYPIONATE 200 MG/ML IM SOLN: 200.0000 mg | INTRAMUSCULAR | Status: DC

## 2011-11-23 ENCOUNTER — Ambulatory Visit (INDEPENDENT_AMBULATORY_CARE_PROVIDER_SITE_OTHER): Payer: 59 | Admitting: Internal Medicine

## 2011-11-23 DIAGNOSIS — E349 Endocrine disorder, unspecified: Secondary | ICD-10-CM

## 2011-11-23 DIAGNOSIS — E291 Testicular hypofunction: Secondary | ICD-10-CM

## 2011-11-23 MED ORDER — TESTOSTERONE CYPIONATE 200 MG/ML IM SOLN
200.0000 mg | INTRAMUSCULAR | Status: DC
  Administered 2011-11-23 – 2012-02-16 (×5): 200 mg via INTRAMUSCULAR

## 2011-11-24 ENCOUNTER — Ambulatory Visit: Payer: 59 | Admitting: Internal Medicine

## 2011-12-11 ENCOUNTER — Ambulatory Visit: Payer: 59 | Admitting: Internal Medicine

## 2011-12-11 ENCOUNTER — Ambulatory Visit (INDEPENDENT_AMBULATORY_CARE_PROVIDER_SITE_OTHER): Payer: 59 | Admitting: *Deleted

## 2011-12-11 DIAGNOSIS — E291 Testicular hypofunction: Secondary | ICD-10-CM

## 2012-01-04 ENCOUNTER — Ambulatory Visit (INDEPENDENT_AMBULATORY_CARE_PROVIDER_SITE_OTHER): Payer: 59 | Admitting: Internal Medicine

## 2012-01-04 DIAGNOSIS — E291 Testicular hypofunction: Secondary | ICD-10-CM

## 2012-01-26 ENCOUNTER — Ambulatory Visit (INDEPENDENT_AMBULATORY_CARE_PROVIDER_SITE_OTHER): Payer: 59 | Admitting: Internal Medicine

## 2012-01-26 DIAGNOSIS — E291 Testicular hypofunction: Secondary | ICD-10-CM

## 2012-02-16 ENCOUNTER — Ambulatory Visit (INDEPENDENT_AMBULATORY_CARE_PROVIDER_SITE_OTHER): Payer: 59 | Admitting: *Deleted

## 2012-02-16 DIAGNOSIS — E291 Testicular hypofunction: Secondary | ICD-10-CM

## 2012-03-09 ENCOUNTER — Ambulatory Visit: Payer: 59 | Admitting: Internal Medicine

## 2012-03-10 ENCOUNTER — Ambulatory Visit: Payer: 59 | Admitting: *Deleted

## 2012-03-29 ENCOUNTER — Ambulatory Visit (INDEPENDENT_AMBULATORY_CARE_PROVIDER_SITE_OTHER): Payer: 59 | Admitting: *Deleted

## 2012-03-29 DIAGNOSIS — E291 Testicular hypofunction: Secondary | ICD-10-CM

## 2012-03-29 MED ORDER — TESTOSTERONE CYPIONATE 200 MG/ML IM SOLN
200.0000 mg | INTRAMUSCULAR | Status: DC
  Administered 2012-03-29: 200 mg via INTRAMUSCULAR

## 2012-04-07 ENCOUNTER — Other Ambulatory Visit: Payer: Self-pay | Admitting: Internal Medicine

## 2012-04-11 ENCOUNTER — Other Ambulatory Visit: Payer: Self-pay | Admitting: *Deleted

## 2012-04-11 MED ORDER — DICLOFENAC SODIUM 75 MG PO TBEC: 75.0000 mg | DELAYED_RELEASE_TABLET | Freq: Two times a day (BID) | ORAL | Status: DC

## 2012-04-11 MED ORDER — LOVASTATIN 20 MG PO TABS: 20.0000 mg | ORAL_TABLET | Freq: Every day | ORAL | Status: DC

## 2012-04-18 ENCOUNTER — Ambulatory Visit (INDEPENDENT_AMBULATORY_CARE_PROVIDER_SITE_OTHER): Payer: 59 | Admitting: Internal Medicine

## 2012-04-18 DIAGNOSIS — E291 Testicular hypofunction: Secondary | ICD-10-CM

## 2012-04-18 MED ORDER — TESTOSTERONE CYPIONATE 200 MG/ML IM SOLN
200.0000 mg | INTRAMUSCULAR | Status: DC
  Administered 2012-04-18: 200 mg via INTRAMUSCULAR

## 2012-05-10 ENCOUNTER — Ambulatory Visit (INDEPENDENT_AMBULATORY_CARE_PROVIDER_SITE_OTHER): Payer: 59 | Admitting: *Deleted

## 2012-05-10 DIAGNOSIS — E291 Testicular hypofunction: Secondary | ICD-10-CM

## 2012-05-10 MED ORDER — TESTOSTERONE CYPIONATE 200 MG/ML IM SOLN
200.0000 mg | INTRAMUSCULAR | Status: DC
  Administered 2012-05-10: 200 mg via INTRAMUSCULAR

## 2012-05-10 MED ORDER — TESTOSTERONE CYPIONATE 200 MG/ML IM SOLN: 200.0000 mg | INTRAMUSCULAR | Status: DC

## 2012-06-01 ENCOUNTER — Ambulatory Visit (INDEPENDENT_AMBULATORY_CARE_PROVIDER_SITE_OTHER): Payer: 59 | Admitting: *Deleted

## 2012-06-01 DIAGNOSIS — E291 Testicular hypofunction: Secondary | ICD-10-CM

## 2012-06-01 DIAGNOSIS — E349 Endocrine disorder, unspecified: Secondary | ICD-10-CM

## 2012-06-01 MED ORDER — TESTOSTERONE CYPIONATE 200 MG/ML IM SOLN
200.0000 mg | INTRAMUSCULAR | Status: DC
  Administered 2012-06-01: 200 mg via INTRAMUSCULAR

## 2012-06-21 ENCOUNTER — Other Ambulatory Visit: Payer: Self-pay | Admitting: Internal Medicine

## 2012-06-23 ENCOUNTER — Ambulatory Visit (INDEPENDENT_AMBULATORY_CARE_PROVIDER_SITE_OTHER): Payer: 59 | Admitting: Internal Medicine

## 2012-06-23 DIAGNOSIS — E291 Testicular hypofunction: Secondary | ICD-10-CM

## 2012-06-23 MED ORDER — TESTOSTERONE CYPIONATE 200 MG/ML IM SOLN
200.0000 mg | INTRAMUSCULAR | Status: AC
  Administered 2012-06-23: 200 mg via INTRAMUSCULAR

## 2012-07-08 ENCOUNTER — Ambulatory Visit (INDEPENDENT_AMBULATORY_CARE_PROVIDER_SITE_OTHER): Payer: 59 | Admitting: *Deleted

## 2012-07-08 DIAGNOSIS — E291 Testicular hypofunction: Secondary | ICD-10-CM

## 2012-07-08 MED ORDER — TESTOSTERONE CYPIONATE 200 MG/ML IM SOLN
200.0000 mg | INTRAMUSCULAR | Status: DC
  Administered 2012-07-08: 200 mg via INTRAMUSCULAR

## 2012-08-05 ENCOUNTER — Ambulatory Visit (INDEPENDENT_AMBULATORY_CARE_PROVIDER_SITE_OTHER): Payer: 59 | Admitting: Internal Medicine

## 2012-08-05 DIAGNOSIS — E349 Endocrine disorder, unspecified: Secondary | ICD-10-CM

## 2012-08-05 DIAGNOSIS — E291 Testicular hypofunction: Secondary | ICD-10-CM

## 2012-08-05 MED ORDER — TESTOSTERONE CYPIONATE 200 MG/ML IM SOLN
200.0000 mg | INTRAMUSCULAR | Status: AC
  Administered 2012-08-05: 200 mg via INTRAMUSCULAR

## 2012-08-23 ENCOUNTER — Ambulatory Visit (INDEPENDENT_AMBULATORY_CARE_PROVIDER_SITE_OTHER): Payer: 59 | Admitting: *Deleted

## 2012-08-23 DIAGNOSIS — E291 Testicular hypofunction: Secondary | ICD-10-CM

## 2012-08-23 MED ORDER — TESTOSTERONE CYPIONATE 200 MG/ML IM SOLN
200.0000 mg | INTRAMUSCULAR | Status: DC
  Administered 2012-08-23: 200 mg via INTRAMUSCULAR

## 2012-09-06 ENCOUNTER — Other Ambulatory Visit: Payer: Self-pay | Admitting: Internal Medicine

## 2012-09-16 ENCOUNTER — Ambulatory Visit (INDEPENDENT_AMBULATORY_CARE_PROVIDER_SITE_OTHER): Payer: 59 | Admitting: Internal Medicine

## 2012-09-16 DIAGNOSIS — E291 Testicular hypofunction: Secondary | ICD-10-CM

## 2012-09-16 MED ORDER — TESTOSTERONE CYPIONATE 200 MG/ML IM SOLN
200.0000 mg | INTRAMUSCULAR | Status: DC
  Administered 2012-09-16: 200 mg via INTRAMUSCULAR

## 2012-10-13 ENCOUNTER — Ambulatory Visit (INDEPENDENT_AMBULATORY_CARE_PROVIDER_SITE_OTHER): Payer: 59 | Admitting: *Deleted

## 2012-10-13 DIAGNOSIS — E291 Testicular hypofunction: Secondary | ICD-10-CM

## 2012-10-13 MED ORDER — TESTOSTERONE CYPIONATE 200 MG/ML IM SOLN
200.0000 mg | INTRAMUSCULAR | Status: AC
  Administered 2012-10-13: 200 mg via INTRAMUSCULAR

## 2012-10-19 ENCOUNTER — Other Ambulatory Visit: Payer: Self-pay | Admitting: *Deleted

## 2012-10-19 MED ORDER — LOVASTATIN 20 MG PO TABS: 20.0000 mg | ORAL_TABLET | Freq: Every day | ORAL | Status: DC

## 2012-11-07 ENCOUNTER — Encounter: Payer: Self-pay | Admitting: *Deleted

## 2012-11-07 ENCOUNTER — Ambulatory Visit (INDEPENDENT_AMBULATORY_CARE_PROVIDER_SITE_OTHER): Payer: 59 | Admitting: *Deleted

## 2012-11-07 DIAGNOSIS — E291 Testicular hypofunction: Secondary | ICD-10-CM

## 2012-11-07 MED ORDER — TESTOSTERONE CYPIONATE 200 MG/ML IM SOLN
200.0000 mg | Freq: Once | INTRAMUSCULAR | Status: AC
  Administered 2012-11-07: 200 mg via INTRAMUSCULAR

## 2012-11-27 ENCOUNTER — Other Ambulatory Visit: Payer: Self-pay | Admitting: Internal Medicine

## 2012-12-02 ENCOUNTER — Ambulatory Visit (INDEPENDENT_AMBULATORY_CARE_PROVIDER_SITE_OTHER): Payer: 59 | Admitting: *Deleted

## 2012-12-02 DIAGNOSIS — E291 Testicular hypofunction: Secondary | ICD-10-CM

## 2012-12-02 MED ORDER — TESTOSTERONE CYPIONATE 200 MG/ML IM SOLN
200.0000 mg | INTRAMUSCULAR | Status: DC
  Administered 2012-12-02: 200 mg via INTRAMUSCULAR

## 2012-12-03 ENCOUNTER — Other Ambulatory Visit: Payer: Self-pay | Admitting: Internal Medicine

## 2012-12-14 ENCOUNTER — Other Ambulatory Visit: Payer: Self-pay | Admitting: *Deleted

## 2012-12-14 MED ORDER — ZOLPIDEM TARTRATE 5 MG PO TABS: ORAL_TABLET | ORAL | Status: DC

## 2012-12-22 ENCOUNTER — Ambulatory Visit (INDEPENDENT_AMBULATORY_CARE_PROVIDER_SITE_OTHER): Payer: 59 | Admitting: *Deleted

## 2012-12-22 DIAGNOSIS — E291 Testicular hypofunction: Secondary | ICD-10-CM

## 2012-12-22 MED ORDER — TESTOSTERONE CYPIONATE 200 MG/ML IM SOLN
200.0000 mg | Freq: Once | INTRAMUSCULAR | Status: AC
  Administered 2012-12-22: 200 mg via INTRAMUSCULAR

## 2012-12-22 MED ORDER — TESTOSTERONE CYPIONATE 200 MG/ML IM SOLN: 200.0000 mg | INTRAMUSCULAR | Status: DC

## 2013-01-12 ENCOUNTER — Ambulatory Visit (INDEPENDENT_AMBULATORY_CARE_PROVIDER_SITE_OTHER): Payer: 59 | Admitting: Internal Medicine

## 2013-01-12 DIAGNOSIS — E291 Testicular hypofunction: Secondary | ICD-10-CM

## 2013-01-12 MED ORDER — TESTOSTERONE CYPIONATE 200 MG/ML IM SOLN
200.0000 mg | Freq: Once | INTRAMUSCULAR | Status: AC
  Administered 2013-01-12: 200 mg via INTRAMUSCULAR

## 2013-02-09 ENCOUNTER — Ambulatory Visit (INDEPENDENT_AMBULATORY_CARE_PROVIDER_SITE_OTHER): Payer: 59 | Admitting: *Deleted

## 2013-02-09 DIAGNOSIS — E349 Endocrine disorder, unspecified: Secondary | ICD-10-CM

## 2013-02-09 DIAGNOSIS — E291 Testicular hypofunction: Secondary | ICD-10-CM

## 2013-02-09 MED ORDER — TESTOSTERONE CYPIONATE 200 MG/ML IM SOLN
200.0000 mg | INTRAMUSCULAR | Status: DC
  Administered 2013-02-09: 200 mg via INTRAMUSCULAR

## 2013-02-13 ENCOUNTER — Encounter: Payer: Self-pay | Admitting: Internal Medicine

## 2013-02-13 ENCOUNTER — Ambulatory Visit (INDEPENDENT_AMBULATORY_CARE_PROVIDER_SITE_OTHER): Payer: 59 | Admitting: Internal Medicine

## 2013-02-13 VITALS — BP 96/70 | HR 76 | Temp 97.9°F | Wt 176.0 lb

## 2013-02-13 DIAGNOSIS — E785 Hyperlipidemia, unspecified: Secondary | ICD-10-CM

## 2013-02-13 DIAGNOSIS — E291 Testicular hypofunction: Secondary | ICD-10-CM

## 2013-02-13 LAB — HEPATIC FUNCTION PANEL
Alkaline Phosphatase: 58 U/L (ref 39–117)
Bilirubin, Direct: 0.2 mg/dL (ref 0.0–0.3)
Total Bilirubin: 1.1 mg/dL (ref 0.3–1.2)

## 2013-02-13 LAB — PSA: PSA: 1.12 ng/mL (ref 0.10–4.00)

## 2013-02-13 LAB — TESTOSTERONE: Testosterone: 550.63 ng/dL (ref 350.00–890.00)

## 2013-02-13 LAB — LIPID PANEL
LDL Cholesterol: 89 mg/dL (ref 0–99)
VLDL: 22.2 mg/dL (ref 0.0–40.0)

## 2013-02-13 MED ORDER — DICLOFENAC SODIUM 75 MG PO TBEC: DELAYED_RELEASE_TABLET | ORAL | Status: DC

## 2013-02-13 NOTE — Assessment & Plan Note (Signed)
Check laboratory work today. Continue same medications.

## 2013-02-13 NOTE — Assessment & Plan Note (Signed)
Tolerating replacement therapy. We'll check PSA and testosterone level today.

## 2013-02-13 NOTE — Progress Notes (Signed)
Patient with history of hyperlipidemia and testicular hypofunction. He's been receiving testosterone injections and has been doing well. He is also tolerating lovastatin.  He is due for laboratory work.  Reviewed past medical history, family history, social history, medications.  Review of systems: Unremarkable  Physical exam vital signs reviewed. Chest clear to auscultation cardiac exam S1-S2 are regular. Abdominal exam and bowel sounds, soft. Extremities no edema.

## 2013-02-14 ENCOUNTER — Other Ambulatory Visit: Payer: Self-pay | Admitting: Internal Medicine

## 2013-02-16 ENCOUNTER — Other Ambulatory Visit (INDEPENDENT_AMBULATORY_CARE_PROVIDER_SITE_OTHER): Payer: Self-pay

## 2013-02-16 ENCOUNTER — Other Ambulatory Visit: Payer: Self-pay | Admitting: Internal Medicine

## 2013-02-16 MED ORDER — ZOLPIDEM TARTRATE 5 MG PO TABS: ORAL_TABLET | ORAL | Status: DC

## 2013-03-01 ENCOUNTER — Ambulatory Visit (INDEPENDENT_AMBULATORY_CARE_PROVIDER_SITE_OTHER): Payer: 59 | Admitting: *Deleted

## 2013-03-01 DIAGNOSIS — E291 Testicular hypofunction: Secondary | ICD-10-CM

## 2013-03-01 MED ORDER — TESTOSTERONE CYPIONATE 200 MG/ML IM SOLN
200.0000 mg | Freq: Once | INTRAMUSCULAR | Status: AC
  Administered 2013-03-01: 200 mg via INTRAMUSCULAR

## 2013-03-23 ENCOUNTER — Ambulatory Visit (INDEPENDENT_AMBULATORY_CARE_PROVIDER_SITE_OTHER): Payer: 59 | Admitting: *Deleted

## 2013-03-23 DIAGNOSIS — E291 Testicular hypofunction: Secondary | ICD-10-CM

## 2013-03-23 DIAGNOSIS — E349 Endocrine disorder, unspecified: Secondary | ICD-10-CM

## 2013-03-23 MED ORDER — TESTOSTERONE CYPIONATE 200 MG/ML IM SOLN
200.0000 mg | Freq: Once | INTRAMUSCULAR | Status: AC
  Administered 2013-03-23: 200 mg via INTRAMUSCULAR

## 2013-04-14 ENCOUNTER — Ambulatory Visit (INDEPENDENT_AMBULATORY_CARE_PROVIDER_SITE_OTHER): Payer: 59 | Admitting: *Deleted

## 2013-04-14 DIAGNOSIS — E349 Endocrine disorder, unspecified: Secondary | ICD-10-CM

## 2013-04-14 DIAGNOSIS — E291 Testicular hypofunction: Secondary | ICD-10-CM

## 2013-04-14 MED ORDER — TESTOSTERONE CYPIONATE 200 MG/ML IM SOLN
200.0000 mg | INTRAMUSCULAR | Status: DC
  Administered 2013-04-14: 200 mg via INTRAMUSCULAR

## 2013-05-03 ENCOUNTER — Ambulatory Visit (INDEPENDENT_AMBULATORY_CARE_PROVIDER_SITE_OTHER): Payer: 59 | Admitting: *Deleted

## 2013-05-03 DIAGNOSIS — E349 Endocrine disorder, unspecified: Secondary | ICD-10-CM

## 2013-05-03 DIAGNOSIS — E291 Testicular hypofunction: Secondary | ICD-10-CM

## 2013-05-03 MED ORDER — TESTOSTERONE CYPIONATE 200 MG/ML IM SOLN
200.0000 mg | Freq: Once | INTRAMUSCULAR | Status: AC
  Administered 2013-05-03: 200 mg via INTRAMUSCULAR

## 2013-05-25 ENCOUNTER — Other Ambulatory Visit: Payer: Self-pay

## 2013-05-26 ENCOUNTER — Ambulatory Visit (INDEPENDENT_AMBULATORY_CARE_PROVIDER_SITE_OTHER): Payer: 59 | Admitting: Internal Medicine

## 2013-05-26 DIAGNOSIS — E291 Testicular hypofunction: Secondary | ICD-10-CM

## 2013-05-26 DIAGNOSIS — E349 Endocrine disorder, unspecified: Secondary | ICD-10-CM

## 2013-05-26 MED ORDER — TESTOSTERONE CYPIONATE 200 MG/ML IM SOLN
200.0000 mg | Freq: Once | INTRAMUSCULAR | Status: AC
  Administered 2013-05-26: 200 mg via INTRAMUSCULAR

## 2013-06-29 ENCOUNTER — Ambulatory Visit (INDEPENDENT_AMBULATORY_CARE_PROVIDER_SITE_OTHER): Payer: 59 | Admitting: Internal Medicine

## 2013-06-29 DIAGNOSIS — E349 Endocrine disorder, unspecified: Secondary | ICD-10-CM

## 2013-06-29 DIAGNOSIS — E291 Testicular hypofunction: Secondary | ICD-10-CM

## 2013-06-29 MED ORDER — TESTOSTERONE CYPIONATE 200 MG/ML IM SOLN
200.0000 mg | Freq: Once | INTRAMUSCULAR | Status: AC
  Administered 2013-06-29: 200 mg via INTRAMUSCULAR

## 2013-07-25 ENCOUNTER — Ambulatory Visit (INDEPENDENT_AMBULATORY_CARE_PROVIDER_SITE_OTHER): Payer: 59 | Admitting: Internal Medicine

## 2013-07-25 DIAGNOSIS — E349 Endocrine disorder, unspecified: Secondary | ICD-10-CM

## 2013-07-25 DIAGNOSIS — E291 Testicular hypofunction: Secondary | ICD-10-CM

## 2013-07-25 MED ORDER — TESTOSTERONE CYPIONATE 200 MG/ML IM SOLN
200.0000 mg | INTRAMUSCULAR | Status: DC
  Administered 2013-07-25: 200 mg via INTRAMUSCULAR

## 2013-08-09 ENCOUNTER — Other Ambulatory Visit: Payer: Self-pay | Admitting: Internal Medicine

## 2013-08-14 ENCOUNTER — Ambulatory Visit (INDEPENDENT_AMBULATORY_CARE_PROVIDER_SITE_OTHER): Payer: 59 | Admitting: *Deleted

## 2013-08-14 ENCOUNTER — Other Ambulatory Visit: Payer: Self-pay | Admitting: Internal Medicine

## 2013-08-14 DIAGNOSIS — E291 Testicular hypofunction: Secondary | ICD-10-CM

## 2013-08-14 LAB — TESTOSTERONE: TESTOSTERONE: 214.88 ng/dL — AB (ref 350.00–890.00)

## 2013-08-14 MED ORDER — TESTOSTERONE CYPIONATE 200 MG/ML IM SOLN
200.0000 mg | INTRAMUSCULAR | Status: DC
Start: 2013-08-14 — End: 2013-08-14
  Administered 2013-08-14: 200 mg via INTRAMUSCULAR

## 2013-08-14 MED ORDER — TESTOSTERONE CYPIONATE 200 MG/ML IM SOLN: 200.0000 mg | INTRAMUSCULAR | Status: DC

## 2013-09-05 ENCOUNTER — Ambulatory Visit: Payer: 59 | Admitting: *Deleted

## 2013-09-07 ENCOUNTER — Ambulatory Visit (INDEPENDENT_AMBULATORY_CARE_PROVIDER_SITE_OTHER): Payer: 59 | Admitting: *Deleted

## 2013-09-07 DIAGNOSIS — E349 Endocrine disorder, unspecified: Secondary | ICD-10-CM

## 2013-09-07 DIAGNOSIS — E291 Testicular hypofunction: Secondary | ICD-10-CM

## 2013-09-07 MED ORDER — TESTOSTERONE CYPIONATE 200 MG/ML IM SOLN: 200.0000 mg | INTRAMUSCULAR | Status: DC

## 2013-09-07 MED ORDER — TESTOSTERONE CYPIONATE 200 MG/ML IM SOLN
200.0000 mg | Freq: Once | INTRAMUSCULAR | Status: AC
  Administered 2013-09-07: 200 mg via INTRAMUSCULAR

## 2013-09-25 ENCOUNTER — Ambulatory Visit (INDEPENDENT_AMBULATORY_CARE_PROVIDER_SITE_OTHER): Payer: 59 | Admitting: *Deleted

## 2013-09-25 DIAGNOSIS — E291 Testicular hypofunction: Secondary | ICD-10-CM

## 2013-09-25 MED ORDER — TESTOSTERONE CYPIONATE 200 MG/ML IM SOLN
200.0000 mg | Freq: Once | INTRAMUSCULAR | Status: AC
  Administered 2013-09-25: 200 mg via INTRAMUSCULAR

## 2013-09-27 ENCOUNTER — Other Ambulatory Visit: Payer: Self-pay | Admitting: Internal Medicine

## 2013-09-28 ENCOUNTER — Other Ambulatory Visit: Payer: Self-pay | Admitting: Internal Medicine

## 2013-10-17 ENCOUNTER — Other Ambulatory Visit: Payer: Self-pay | Admitting: *Deleted

## 2013-10-17 MED ORDER — ZOLPIDEM TARTRATE 5 MG PO TABS: ORAL_TABLET | ORAL | Status: DC

## 2013-10-24 ENCOUNTER — Ambulatory Visit (INDEPENDENT_AMBULATORY_CARE_PROVIDER_SITE_OTHER): Payer: 59 | Admitting: *Deleted

## 2013-10-24 DIAGNOSIS — R7989 Other specified abnormal findings of blood chemistry: Secondary | ICD-10-CM

## 2013-10-24 DIAGNOSIS — E291 Testicular hypofunction: Secondary | ICD-10-CM

## 2013-10-24 MED ORDER — TESTOSTERONE CYPIONATE 100 MG/ML IM SOLN
200.0000 mg | Freq: Once | INTRAMUSCULAR | Status: AC
  Administered 2013-10-24: 200 mg via INTRAMUSCULAR

## 2013-11-15 ENCOUNTER — Ambulatory Visit (INDEPENDENT_AMBULATORY_CARE_PROVIDER_SITE_OTHER): Payer: 59 | Admitting: *Deleted

## 2013-11-15 DIAGNOSIS — E349 Endocrine disorder, unspecified: Secondary | ICD-10-CM

## 2013-11-15 DIAGNOSIS — E291 Testicular hypofunction: Secondary | ICD-10-CM

## 2013-11-15 MED ORDER — TESTOSTERONE CYPIONATE 200 MG/ML IM SOLN
200.0000 mg | Freq: Once | INTRAMUSCULAR | Status: AC
  Administered 2013-11-15: 200 mg via INTRAMUSCULAR

## 2013-11-30 ENCOUNTER — Encounter: Payer: Self-pay | Admitting: Internal Medicine

## 2013-11-30 ENCOUNTER — Ambulatory Visit (INDEPENDENT_AMBULATORY_CARE_PROVIDER_SITE_OTHER): Payer: 59 | Admitting: Internal Medicine

## 2013-11-30 VITALS — BP 116/80 | Temp 98.3°F | Ht 69.5 in | Wt 174.0 lb

## 2013-11-30 DIAGNOSIS — J029 Acute pharyngitis, unspecified: Secondary | ICD-10-CM

## 2013-11-30 DIAGNOSIS — J069 Acute upper respiratory infection, unspecified: Secondary | ICD-10-CM

## 2013-11-30 DIAGNOSIS — Z2089 Contact with and (suspected) exposure to other communicable diseases: Secondary | ICD-10-CM

## 2013-11-30 DIAGNOSIS — Z20818 Contact with and (suspected) exposure to other bacterial communicable diseases: Secondary | ICD-10-CM

## 2013-11-30 DIAGNOSIS — B9789 Other viral agents as the cause of diseases classified elsewhere: Secondary | ICD-10-CM

## 2013-11-30 LAB — POCT RAPID STREP A (OFFICE): Rapid Strep A Screen: NEGATIVE

## 2013-11-30 NOTE — Progress Notes (Signed)
Pre visit review using our clinic review tool, if applicable. No additional management support is needed unless otherwise documented below in the visit note.   Chief Complaint  Patient presents with  . Sore Throat    Started three days ago. Child recently dx with strep.  . Cough  . Fever  . Chest Congestion  . Nasal Congestion  . Otalgia    HPI: Patient comes in today for SDA for  new problem evaluation.PCP NA Feverover 100.3  last night .   throat very sore in am and pm  but now congested cough  And rattling upper respiratory. swollne glands. Ear discomfort .   nyquil and robitussin . recent travel. Some eye redness no dc no cp sob  Hemoptysis.  53 year old .had strep dx . To start doxycyline for follicultis.   Recurrent.  ROS: See pertinent positives and negatives per HPI.  Past Medical History  Diagnosis Date  . ALLERGIC RHINITIS 08/03/2007  . BENIGN PROSTATIC HYPERTROPHY 08/03/2007  . HYPERLIPIDEMIA 08/03/2007  . TESTICULAR HYPOFUNCTION 12/04/2009  . Chronic low back pain     Family History  Problem Relation Age of Onset  . ALS Mother   . Arthritis Mother   . Heart disease Father   . Stroke Father [id number]    hemorrhagic  . Colon cancer Paternal Grandmother   . Heart disease Paternal Grandfather     History   Social History  . Marital Status: Married    Spouse Name: N/A    Number of Children: N/A  . Years of Education: N/A   Social History Main Topics  . Smoking status: Never Smoker   . Smokeless tobacco: Never Used  . Alcohol Use: No  . Drug Use: No  . Sexual Activity: None   Other Topics Concern  . None   Social History Narrative  . None    Outpatient Encounter Prescriptions as of 11/30/2013  Medication Sig  . diclofenac (VOLTAREN) 75 MG EC tablet Take 1 tablet by mouth two  times daily  . fexofenadine (ALLEGRA) 180 MG tablet Take 180 mg by mouth daily.    Marland Kitchen. lovastatin (MEVACOR) 20 MG tablet Take 1 tablet (20 mg total) by mouth at bedtime.  . Multiple  Vitamin (MULTIVITAMIN) tablet Take 1 tablet by mouth daily.  Marland Kitchen. zolpidem (AMBIEN) 5 MG tablet TAKE ONE TABLET BY MOUTH ONCE DAILY AT BEDTIME  . testosterone cypionate (DEPOTESTOTERONE CYPIONATE) 200 MG/ML injection Inject 1 mL (200 mg total) into the muscle every 21 ( twenty-one) days.    EXAM:  BP 116/80  Temp(Src) 98.3 F (36.8 C) (Oral)  Ht 5' 9.5" (1.765 m)  Wt 174 lb (78.926 kg)  BMI 25.34 kg/m2  Body mass index is 25.34 kg/(m^2). WDWN in NAD  quiet respirations; mildly congested  somewhat hoarse. Non toxic . HEENT: Normocephalic ;atraumatic , Eyes;  PERRL, EOMs  Full, lids and conjunctiva right smal [place] hem muddy no icterus ,,Ears: no deformities, canals nl, TM landmarks normal, Nose: no deformity or discharge but verycongested;face minimally tender Mouth : OP  No edema  1+ red right more than left no exudate  Neck: Supple without adenopathy ac nodes no masses or bruits Chest:  Clear to A without wheezes rales or rhonchi CV:  S1-S2 no gallops or murmurs peripheral perfusion is normal Skin :nl perfusion and no acute rashes   ASSESSMENT AND PLAN:  Discussed the following assessment and plan:  Sore throat - Plan: POC Rapid Strep A, Throat culture Loney Loh([facility])  Exposure to strep throat - Plan: POC Rapid Strep A, Throat culture ([facility])  Viral URI with cough - poss adeno with fever and pharyngitis Monitor for alarm features   Ok to take doxy  Doubt strep as cause .  -Patient advised to return or notify health care team  if symptoms worsen ,persist or new concerns arise.  Patient Instructions  I think this is viral respiratory . Will contact you about culture results . Fever should be gone in another 48 hours  Either way. Gargles  ibu or tylenol for fever  Comfort medications with care.  Ears are normal  dicomfort seems to be from the throat.  Ok to take doxycycline  Neta MendsWanda K. Panosh M.D.

## 2013-11-30 NOTE — Patient Instructions (Signed)
I think this is viral respiratory . Will contact you about culture results . Fever should be gone in another 48 hours  Either way. Gargles  ibu or tylenol for fever  Comfort medications with care.  Ears are normal  dicomfort seems to be from the throat.  Ok to take doxycycline

## 2013-12-02 LAB — CULTURE, GROUP A STREP: ORGANISM ID, BACTERIA: NORMAL

## 2013-12-04 ENCOUNTER — Encounter: Payer: Self-pay | Admitting: Family Medicine

## 2013-12-14 ENCOUNTER — Ambulatory Visit (INDEPENDENT_AMBULATORY_CARE_PROVIDER_SITE_OTHER): Payer: 59 | Admitting: *Deleted

## 2013-12-14 DIAGNOSIS — E291 Testicular hypofunction: Secondary | ICD-10-CM

## 2013-12-14 DIAGNOSIS — E349 Endocrine disorder, unspecified: Secondary | ICD-10-CM

## 2013-12-14 MED ORDER — TESTOSTERONE CYPIONATE 200 MG/ML IM SOLN
200.0000 mg | Freq: Once | INTRAMUSCULAR | Status: AC
  Administered 2013-12-14: 200 mg via INTRAMUSCULAR

## 2013-12-19 ENCOUNTER — Other Ambulatory Visit: Payer: Self-pay | Admitting: Internal Medicine

## 2014-01-11 ENCOUNTER — Ambulatory Visit (INDEPENDENT_AMBULATORY_CARE_PROVIDER_SITE_OTHER): Payer: 59 | Admitting: *Deleted

## 2014-01-11 DIAGNOSIS — E349 Endocrine disorder, unspecified: Secondary | ICD-10-CM

## 2014-01-11 DIAGNOSIS — E291 Testicular hypofunction: Secondary | ICD-10-CM

## 2014-01-11 MED ORDER — TESTOSTERONE CYPIONATE 200 MG/ML IM SOLN
200.0000 mg | Freq: Once | INTRAMUSCULAR | Status: AC
  Administered 2014-01-11: 200 mg via INTRAMUSCULAR

## 2014-02-08 ENCOUNTER — Ambulatory Visit (INDEPENDENT_AMBULATORY_CARE_PROVIDER_SITE_OTHER): Payer: 59 | Admitting: *Deleted

## 2014-02-08 DIAGNOSIS — E349 Endocrine disorder, unspecified: Secondary | ICD-10-CM

## 2014-02-08 DIAGNOSIS — E291 Testicular hypofunction: Secondary | ICD-10-CM

## 2014-02-08 MED ORDER — TESTOSTERONE CYPIONATE 200 MG/ML IM SOLN
200.0000 mg | Freq: Once | INTRAMUSCULAR | Status: AC
  Administered 2014-02-08: 200 mg via INTRAMUSCULAR

## 2014-03-01 ENCOUNTER — Telehealth: Payer: Self-pay | Admitting: *Deleted

## 2014-03-01 ENCOUNTER — Ambulatory Visit: Payer: 59 | Admitting: *Deleted

## 2014-03-01 DIAGNOSIS — E291 Testicular hypofunction: Secondary | ICD-10-CM

## 2014-03-01 MED ORDER — TESTOSTERONE CYPIONATE 200 MG/ML IM SOLN: 200.0000 mg | INTRAMUSCULAR | Status: DC

## 2014-03-01 NOTE — Telephone Encounter (Signed)
OK 

## 2014-03-01 NOTE — Telephone Encounter (Signed)
Pt would like to switch to Dr Caryl NeverBurchette.  He seen Dr Caryl NeverBurchette back in 2010 and really liked him.  Please advise if ok

## 2014-03-02 NOTE — Telephone Encounter (Signed)
Pt aware dr Caryl Neverburchette will accept him. Pt will cb to sch appt when he gets his calendar.

## 2014-03-02 NOTE — Telephone Encounter (Signed)
Please schedule new patient appt with Dr Caryl NeverBurchette

## 2014-03-05 NOTE — Telephone Encounter (Signed)
Pt called back and appt made

## 2014-03-19 ENCOUNTER — Ambulatory Visit (INDEPENDENT_AMBULATORY_CARE_PROVIDER_SITE_OTHER): Payer: 59 | Admitting: Family Medicine

## 2014-03-19 ENCOUNTER — Encounter: Payer: Self-pay | Admitting: Family Medicine

## 2014-03-19 VITALS — BP 118/74 | HR 70 | Temp 97.8°F | Wt 178.0 lb

## 2014-03-19 DIAGNOSIS — E291 Testicular hypofunction: Secondary | ICD-10-CM

## 2014-03-19 DIAGNOSIS — Z0000 Encounter for general adult medical examination without abnormal findings: Secondary | ICD-10-CM

## 2014-03-19 DIAGNOSIS — E785 Hyperlipidemia, unspecified: Secondary | ICD-10-CM

## 2014-03-19 MED ORDER — TESTOSTERONE 20.25 MG/1.25GM (1.62%) TD GEL: 2.0000 | Freq: Every day | TRANSDERMAL | Status: DC

## 2014-03-19 NOTE — Progress Notes (Signed)
   Subjective:    Patient ID: Kenneth Knapp, male    DOB: 14-Jun-1961, 53 y.o.   MRN: [id number]  HPI Patient is seen for medical transfer. He has history of hypogonadism and hyperlipidemia. He currently takes Depakote testosterone 200 mg every 21 days. He complains that he feels somewhat jittery for the first few days after injection and then very fatigued prior to the next injection. He has previously used topical testosterone and wants to consider going back. He has not had a complete physical in little over a year. Denies any BPH symptoms.  He takes lovastatin for hyperlipidemia. No history of CAD. No recent chest pains. No myalgias.  Past Medical History  Diagnosis Date  . ALLERGIC RHINITIS 08/03/2007  . BENIGN PROSTATIC HYPERTROPHY 08/03/2007  . HYPERLIPIDEMIA 08/03/2007  . TESTICULAR HYPOFUNCTION 12/04/2009  . Chronic low back pain    Past Surgical History  Procedure Laterality Date  . Back surgery  2002  . Wisdom tooth extraction      reports that he has never smoked. He has never used smokeless tobacco. He reports that he does not drink alcohol or use illicit drugs. family history includes ALS in his mother; Arthritis in his mother; Colon cancer in his paternal grandmother; Heart disease in his father and paternal grandfather; Stroke (age of onset: 26) in his father. No Known Allergies    Review of Systems  Constitutional: Negative for appetite change, fatigue and unexpected weight change.  Eyes: Negative for visual disturbance.  Respiratory: Negative for cough, chest tightness and shortness of breath.   Cardiovascular: Negative for chest pain, palpitations and leg swelling.  Endocrine: Negative for polydipsia and polyuria.  Neurological: Negative for dizziness, syncope, weakness, light-headedness and headaches.       Objective:   Physical Exam  Constitutional: He is oriented to person, place, and time. He appears well-developed and well-nourished.  HENT:  Right Ear:  External ear normal.  Left Ear: External ear normal.  Mouth/Throat: Oropharynx is clear and moist.  Eyes: Pupils are equal, round, and reactive to light.  Neck: Neck supple. No thyromegaly present.  Cardiovascular: Normal rate and regular rhythm.   Pulmonary/Chest: Effort normal and breath sounds normal. No respiratory distress. He has no wheezes. He has no rales.  Musculoskeletal: He exhibits no edema.  Neurological: He is alert and oriented to person, place, and time.          Assessment & Plan:  #1 hypogonadism. We discussed options. He prefers to go back on topical. We'll start AndroGel 1.62% one spray per arm once daily. Repeat testosterone level at followup. Also needs repeat CBC and PSA #2 hyperlipidemia. Schedule future labs with lipid and hepatic panel #3 health maintenance. Flu vaccine offered but he plans to get through work. Schedule complete physical

## 2014-03-19 NOTE — Progress Notes (Signed)
Pre visit review using our clinic review tool, if applicable. No additional management support is needed unless otherwise documented below in the visit note. 

## 2014-03-27 ENCOUNTER — Other Ambulatory Visit: Payer: Self-pay | Admitting: Internal Medicine

## 2014-03-30 ENCOUNTER — Telehealth: Payer: Self-pay | Admitting: Family Medicine

## 2014-03-30 ENCOUNTER — Other Ambulatory Visit: Payer: Self-pay

## 2014-03-30 MED ORDER — TESTOSTERONE 20.25 MG/1.25GM (1.62%) TD GEL: 2.0000 | Freq: Every day | TRANSDERMAL | Status: DC

## 2014-03-30 NOTE — Telephone Encounter (Signed)
[employer] denied Androgel Pump.  Pt's plan covers Androderm and brand Testim.

## 2014-04-01 NOTE — Telephone Encounter (Signed)
testim gel 1% apply 5 grams once daily.  Disp for 6 months.  Needs total testosterone level within one month after starting this.

## 2014-04-02 MED ORDER — TESTOSTERONE 50 MG/5GM (1%) TD GEL: 5.0000 g | Freq: Every day | TRANSDERMAL | Status: DC

## 2014-04-02 NOTE — Telephone Encounter (Signed)
RX sent to pharmacy and pt is aware 

## 2014-04-04 ENCOUNTER — Other Ambulatory Visit: Payer: Self-pay | Admitting: *Deleted

## 2014-04-04 MED ORDER — TESTOSTERONE 50 MG/5GM (1%) TD GEL: 5.0000 g | Freq: Every day | TRANSDERMAL | Status: DC

## 2014-04-27 ENCOUNTER — Other Ambulatory Visit: Payer: Self-pay | Admitting: Internal Medicine

## 2014-04-27 NOTE — Telephone Encounter (Signed)
Last visit 03/19/14 Last refill 10/17/13 #30 5 refills

## 2014-04-29 NOTE — Telephone Encounter (Signed)
Refill OK with 5 additional refills.

## 2014-05-04 ENCOUNTER — Other Ambulatory Visit: Payer: Self-pay

## 2014-05-09 ENCOUNTER — Other Ambulatory Visit (INDEPENDENT_AMBULATORY_CARE_PROVIDER_SITE_OTHER): Payer: 59

## 2014-05-09 ENCOUNTER — Encounter: Payer: Self-pay | Admitting: *Deleted

## 2014-05-09 DIAGNOSIS — Z0000 Encounter for general adult medical examination without abnormal findings: Secondary | ICD-10-CM

## 2014-05-09 LAB — CBC WITH DIFFERENTIAL/PLATELET
BASOS PCT: 0.3 % (ref 0.0–3.0)
Basophils Absolute: 0 10*3/uL (ref 0.0–0.1)
EOS PCT: 3.1 % (ref 0.0–5.0)
Eosinophils Absolute: 0.2 10*3/uL (ref 0.0–0.7)
HEMATOCRIT: 48.2 % (ref 39.0–52.0)
Hemoglobin: 16.2 g/dL (ref 13.0–17.0)
LYMPHS ABS: 1.7 10*3/uL (ref 0.7–4.0)
Lymphocytes Relative: 22.7 % (ref 12.0–46.0)
MCHC: 33.6 g/dL (ref 30.0–36.0)
MCV: 91.8 fl (ref 78.0–100.0)
MONO ABS: 0.5 10*3/uL (ref 0.1–1.0)
Monocytes Relative: 7.1 % (ref 3.0–12.0)
NEUTROS ABS: 4.9 10*3/uL (ref 1.4–7.7)
Neutrophils Relative %: 66.8 % (ref 43.0–77.0)
Platelets: 232 10*3/uL (ref 150.0–400.0)
RBC: 5.25 Mil/uL (ref 4.22–5.81)
RDW: 13.3 % (ref 11.5–15.5)
WBC: 7.3 10*3/uL (ref 4.0–10.5)

## 2014-05-09 LAB — PSA: PSA: 0.79 ng/mL (ref 0.10–4.00)

## 2014-05-09 LAB — TESTOSTERONE: Testosterone: 534.4 ng/dL (ref 300.00–890.00)

## 2014-05-09 LAB — TSH: TSH: 2.06 u[IU]/mL (ref 0.35–4.50)

## 2014-05-10 LAB — BASIC METABOLIC PANEL
BUN: 11 mg/dL (ref 6–23)
CO2: 24 mEq/L (ref 19–32)
Calcium: 9.3 mg/dL (ref 8.4–10.5)
Chloride: 106 mEq/L (ref 96–112)
Creatinine, Ser: 1 mg/dL (ref 0.4–1.5)
GFR: 80.26 mL/min (ref >60.00)
GLUCOSE: 88 mg/dL (ref 70–99)
POTASSIUM: 4.8 meq/L (ref 3.5–5.1)
SODIUM: 140 meq/L (ref 135–145)

## 2014-05-10 LAB — LIPID PANEL
Cholesterol: 152 mg/dL (ref 0–200)
HDL: 51.4 mg/dL
LDL Cholesterol: 91 mg/dL (ref 0–99)
NonHDL: 100.6
Total CHOL/HDL Ratio: 3
Triglycerides: 49 mg/dL (ref 0.0–149.0)
VLDL: 9.8 mg/dL (ref 0.0–40.0)

## 2014-05-10 LAB — HEPATIC FUNCTION PANEL
ALBUMIN: 3.9 g/dL (ref 3.5–5.2)
ALT: 34 U/L (ref 0–53)
AST: 36 U/L (ref 0–37)
Alkaline Phosphatase: 67 U/L (ref 39–117)
BILIRUBIN TOTAL: 1.3 mg/dL — AB (ref 0.2–1.2)
Bilirubin, Direct: 0.1 mg/dL (ref 0.0–0.3)
Total Protein: 7.1 g/dL (ref 6.0–8.3)

## 2014-05-13 ENCOUNTER — Other Ambulatory Visit: Payer: Self-pay | Admitting: Internal Medicine

## 2014-05-24 ENCOUNTER — Other Ambulatory Visit: Payer: 59

## 2014-07-25 ENCOUNTER — Encounter: Payer: Self-pay | Admitting: Family Medicine

## 2014-07-25 ENCOUNTER — Ambulatory Visit (INDEPENDENT_AMBULATORY_CARE_PROVIDER_SITE_OTHER): Payer: 59 | Admitting: Family Medicine

## 2014-07-25 VITALS — BP 118/68 | HR 87 | Temp 97.8°F | Wt 179.0 lb

## 2014-07-25 DIAGNOSIS — F419 Anxiety disorder, unspecified: Secondary | ICD-10-CM

## 2014-07-25 DIAGNOSIS — F418 Other specified anxiety disorders: Secondary | ICD-10-CM

## 2014-07-25 MED ORDER — PROPRANOLOL HCL 10 MG PO TABS: ORAL_TABLET | ORAL | Status: DC

## 2014-07-25 NOTE — Progress Notes (Signed)
Pre visit review using our clinic review tool, if applicable. No additional management support is needed unless otherwise documented below in the visit note. 

## 2014-07-25 NOTE — Progress Notes (Signed)
   Subjective:    Patient ID: Kenneth Knapp, male    DOB: 1960/08/04, 54 y.o.   MRN: [id number]  HPI Patient seen for follow-up regarding stage anxiety. He does some performance with music theater. He thinks he might have taken low-dose beta blocker in the past. He sometimes has fear of becoming dizzy though he has not had any syncopal episodes. He sometimes has mild palpitations. He is not describing orthostatic symptoms. No history of asthma.  Past Medical History  Diagnosis Date  . ALLERGIC RHINITIS 08/03/2007  . BENIGN PROSTATIC HYPERTROPHY 08/03/2007  . HYPERLIPIDEMIA 08/03/2007  . TESTICULAR HYPOFUNCTION 12/04/2009  . Chronic low back pain    Past Surgical History  Procedure Laterality Date  . Back surgery  2002  . Wisdom tooth extraction      reports that he has never smoked. He has never used smokeless tobacco. He reports that he does not drink alcohol or use illicit drugs. family history includes ALS in his mother; Arthritis in his mother; Colon cancer in his paternal grandmother; Heart disease in his father and paternal grandfather; Stroke (age of onset: 6173) in his father. No Known Allergies    Review of Systems  Constitutional: Negative for fatigue.  Eyes: Negative for visual disturbance.  Respiratory: Negative for cough, chest tightness, shortness of breath and wheezing.   Cardiovascular: Negative for chest pain, palpitations and leg swelling.  Neurological: Negative for dizziness, syncope, weakness, light-headedness and headaches.       Objective:   Physical Exam  Constitutional: He appears well-developed and well-nourished.  Cardiovascular: Normal rate and regular rhythm.  Exam reveals no gallop.   No murmur heard. Pulmonary/Chest: Effort normal and breath sounds normal. No respiratory distress. He has no wheezes. He has no rales.          Assessment & Plan:  Stage anxiety/performance anxiety. We recommended trial of low-dose propranolol 10 mg about one  hour prior to performance. He does not have any contraindications. Be in touch if this is not working

## 2014-10-15 ENCOUNTER — Other Ambulatory Visit: Payer: Self-pay | Admitting: Family Medicine

## 2014-10-15 NOTE — Telephone Encounter (Signed)
Refills OK. 

## 2014-11-03 ENCOUNTER — Other Ambulatory Visit: Payer: Self-pay | Admitting: Family Medicine

## 2014-11-05 NOTE — Telephone Encounter (Signed)
Refill for 6 months. 

## 2014-11-05 NOTE — Telephone Encounter (Signed)
Last visit 07/25/14 Last refill 04/30/14 #30 5 refill

## 2014-12-20 ENCOUNTER — Other Ambulatory Visit: Payer: Self-pay | Admitting: Family Medicine

## 2014-12-20 NOTE — Telephone Encounter (Signed)
Refill OK with 3 additional refills. 

## 2014-12-20 NOTE — Telephone Encounter (Signed)
Last visit 07/25/14 Last refill 10/16/14 5g 1 refill

## 2015-01-16 ENCOUNTER — Other Ambulatory Visit: Payer: Self-pay | Admitting: Family Medicine

## 2015-03-05 ENCOUNTER — Other Ambulatory Visit: Payer: Self-pay | Admitting: Family Medicine

## 2015-04-03 ENCOUNTER — Encounter: Payer: Self-pay | Admitting: Family Medicine

## 2015-04-03 ENCOUNTER — Ambulatory Visit (INDEPENDENT_AMBULATORY_CARE_PROVIDER_SITE_OTHER): Payer: Managed Care, Other (non HMO) | Admitting: Family Medicine

## 2015-04-03 VITALS — BP 110/70 | HR 72 | Temp 98.1°F | Ht 69.5 in | Wt 180.3 lb

## 2015-04-03 DIAGNOSIS — M546 Pain in thoracic spine: Secondary | ICD-10-CM

## 2015-04-03 DIAGNOSIS — M549 Dorsalgia, unspecified: Secondary | ICD-10-CM

## 2015-04-03 MED ORDER — CYCLOBENZAPRINE HCL 10 MG PO TABS: 10.0000 mg | ORAL_TABLET | Freq: Every day | ORAL | Status: DC

## 2015-04-03 NOTE — Progress Notes (Signed)
   Subjective:    Patient ID: Kenneth Knapp, male    DOB: Jan 01, 1961, 54 y.o.   MRN: [id number]  HPI  Patient seen with left upper back pain for about 5 days. No injury. He has increased muscle tension. His tried heat and ibuprofen without relief. He also tried massage which helped only temporarily. He's tried yoga. No radiculopathy symptoms. Denies any cervical neck pain. Pain is mostly trapezius region. Has responded well with muscle relaxers in the past. No upper extremity weakness. No injury.  Past Medical History  Diagnosis Date  . ALLERGIC RHINITIS 08/03/2007  . BENIGN PROSTATIC HYPERTROPHY 08/03/2007  . HYPERLIPIDEMIA 08/03/2007  . TESTICULAR HYPOFUNCTION 12/04/2009  . Chronic low back pain    Past Surgical History  Procedure Laterality Date  . Back surgery  2002  . Wisdom tooth extraction      reports that he has never smoked. He has never used smokeless tobacco. He reports that he does not drink alcohol or use illicit drugs. family history includes ALS in his mother; Arthritis in his mother; Colon cancer in his paternal grandmother; Heart disease in his father and paternal grandfather; Stroke (age of onset: 43) in his father. No Known Allergies   Review of Systems  Respiratory: Negative for shortness of breath.   Cardiovascular: Negative for chest pain.  Musculoskeletal: Positive for back pain.  Neurological: Negative for weakness and numbness.       Objective:   Physical Exam  Constitutional: He appears well-developed and well-nourished.  Cardiovascular: Normal rate and regular rhythm.   Pulmonary/Chest: Effort normal and breath sounds normal. No respiratory distress. He has no wheezes. He has no rales.  Musculoskeletal: He exhibits no edema.  Mildly tender to palpation left trapezius muscles. Full range of motion cervical spine  Neurological:  Full strength upper extremities          Assessment & Plan:  Upper back pain, left side. Suspect muscular.  Continue heat and muscle massage. Flexeril 10 mg daily at bedtime. Continue ibuprofen as needed. Consider physical therapy if no better in 2 weeks

## 2015-04-03 NOTE — Progress Notes (Signed)
Pre visit review using our clinic review tool, if applicable. No additional management support is needed unless otherwise documented below in the visit note. 

## 2015-04-03 NOTE — Patient Instructions (Signed)
Continue with heat and muscle massage. Let me know if no better in 2 weeks with the muscle relaxer.

## 2015-04-26 ENCOUNTER — Telehealth: Payer: Self-pay | Admitting: Family Medicine

## 2015-04-26 MED ORDER — LOVASTATIN 20 MG PO TABS: ORAL_TABLET | ORAL | Status: DC

## 2015-04-26 NOTE — Telephone Encounter (Signed)
Rx was sent by [facility]

## 2015-04-26 NOTE — Telephone Encounter (Signed)
Pt needs new rx lovastatin 20 mg#30 w/refills sent to [facility] on [address].

## 2015-05-11 ENCOUNTER — Other Ambulatory Visit: Payer: Self-pay | Admitting: Family Medicine

## 2015-06-04 ENCOUNTER — Other Ambulatory Visit: Payer: Self-pay | Admitting: Family Medicine

## 2015-06-04 NOTE — Telephone Encounter (Signed)
Refill OK

## 2015-06-04 NOTE — Telephone Encounter (Signed)
Last refill: #30, 5rf 11-05-2014 Pending appt: 06-17-2015 Last f/u on medication: 07-25-2014 Please advise on refill.

## 2015-06-11 ENCOUNTER — Other Ambulatory Visit (INDEPENDENT_AMBULATORY_CARE_PROVIDER_SITE_OTHER): Payer: Managed Care, Other (non HMO)

## 2015-06-11 DIAGNOSIS — Z0000 Encounter for general adult medical examination without abnormal findings: Secondary | ICD-10-CM | POA: Diagnosis not present

## 2015-06-11 LAB — CBC WITH DIFFERENTIAL/PLATELET
BASOS PCT: 0.4 % (ref 0.0–3.0)
Basophils Absolute: 0 10*3/uL (ref 0.0–0.1)
EOS ABS: 0.2 10*3/uL (ref 0.0–0.7)
EOS PCT: 3.4 % (ref 0.0–5.0)
HEMATOCRIT: 51.3 % (ref 39.0–52.0)
Hemoglobin: 17 g/dL (ref 13.0–17.0)
LYMPHS PCT: 22.3 % (ref 12.0–46.0)
Lymphs Abs: 1.5 10*3/uL (ref 0.7–4.0)
MCHC: 33.1 g/dL (ref 30.0–36.0)
MCV: 92.7 fl (ref 78.0–100.0)
Monocytes Absolute: 0.4 10*3/uL (ref 0.1–1.0)
Monocytes Relative: 5.9 % (ref 3.0–12.0)
NEUTROS ABS: 4.6 10*3/uL (ref 1.4–7.7)
Neutrophils Relative %: 68 % (ref 43.0–77.0)
PLATELETS: 205 10*3/uL (ref 150.0–400.0)
RBC: 5.53 Mil/uL (ref 4.22–5.81)
RDW: 13 % (ref 11.5–15.5)
WBC: 6.8 10*3/uL (ref 4.0–10.5)

## 2015-06-11 LAB — BASIC METABOLIC PANEL
BUN: 13 mg/dL (ref 6–23)
CHLORIDE: 101 meq/L (ref 96–112)
CO2: 31 meq/L (ref 19–32)
CREATININE: 0.91 mg/dL (ref 0.40–1.50)
Calcium: 9.6 mg/dL (ref 8.4–10.5)
GFR: 92.22 mL/min (ref >60.00)
Glucose, Bld: 87 mg/dL (ref 70–99)
Potassium: 4.8 mEq/L (ref 3.5–5.1)
Sodium: 139 mEq/L (ref 135–145)

## 2015-06-11 LAB — LIPID PANEL
CHOL/HDL RATIO: 4
CHOLESTEROL: 175 mg/dL (ref 0–200)
HDL: 45 mg/dL (ref >39.00)
LDL CALC: 105 mg/dL — AB (ref 0–99)
NONHDL: 130.11
Triglycerides: 124 mg/dL (ref 0.0–149.0)
VLDL: 24.8 mg/dL (ref 0.0–40.0)

## 2015-06-11 LAB — HEPATIC FUNCTION PANEL
ALBUMIN: 4.4 g/dL (ref 3.5–5.2)
ALT: 36 U/L (ref 0–53)
AST: 28 U/L (ref 0–37)
Alkaline Phosphatase: 79 U/L (ref 39–117)
BILIRUBIN DIRECT: 0.2 mg/dL (ref 0.0–0.3)
TOTAL PROTEIN: 7 g/dL (ref 6.0–8.3)
Total Bilirubin: 1 mg/dL (ref 0.2–1.2)

## 2015-06-11 LAB — TSH: TSH: 2.84 u[IU]/mL (ref 0.35–4.50)

## 2015-06-11 LAB — PSA: PSA: 0.76 ng/mL (ref 0.10–4.00)

## 2015-06-17 ENCOUNTER — Encounter: Payer: Self-pay | Admitting: Family Medicine

## 2015-06-17 ENCOUNTER — Ambulatory Visit (INDEPENDENT_AMBULATORY_CARE_PROVIDER_SITE_OTHER): Payer: Managed Care, Other (non HMO) | Admitting: Family Medicine

## 2015-06-17 VITALS — BP 100/60 | HR 79 | Temp 98.0°F | Resp 16 | Ht 69.5 in | Wt 175.8 lb

## 2015-06-17 DIAGNOSIS — Z23 Encounter for immunization: Secondary | ICD-10-CM

## 2015-06-17 DIAGNOSIS — Z0000 Encounter for general adult medical examination without abnormal findings: Secondary | ICD-10-CM

## 2015-06-17 NOTE — Progress Notes (Signed)
   Subjective:    Patient ID: Kenneth Knapp, male    DOB: 1961/03/15, 54 y.o.   MRN: [id number]  HPI Here for physical. Last tetanus unknown. Colonoscopy 2013. Flu vaccine a few weeks ago. Nonsmoker. Exercises about 2-3 days per week.  History low testosterone. On replacement. Symptomatically improved on replacement. No recurrent back pain since last visit.  Past Medical History  Diagnosis Date  . ALLERGIC RHINITIS 08/03/2007  . BENIGN PROSTATIC HYPERTROPHY 08/03/2007  . HYPERLIPIDEMIA 08/03/2007  . TESTICULAR HYPOFUNCTION 12/04/2009  . Chronic low back pain    Past Surgical History  Procedure Laterality Date  . Back surgery  2002  . Wisdom tooth extraction      reports that he has never smoked. He has never used smokeless tobacco. He reports that he does not drink alcohol or use illicit drugs. family history includes ALS in his mother; Arthritis in his mother; Colon cancer in his paternal grandmother; Heart disease in his father and paternal grandfather; Stroke (age of onset: 4673) in his father. No Known Allergies    Review of Systems  Constitutional: Negative for fever, activity change, appetite change and fatigue.  HENT: Negative for congestion, ear pain and trouble swallowing.   Eyes: Negative for pain and visual disturbance.  Respiratory: Negative for cough, shortness of breath and wheezing.   Cardiovascular: Negative for chest pain and palpitations.  Gastrointestinal: Negative for nausea, vomiting, abdominal pain, diarrhea, constipation, blood in stool, abdominal distention and rectal pain.  Genitourinary: Negative for dysuria, hematuria and testicular pain.  Musculoskeletal: Negative for joint swelling and arthralgias.  Skin: Negative for rash.  Neurological: Negative for dizziness, syncope and headaches.  Hematological: Negative for adenopathy.  Psychiatric/Behavioral: Negative for confusion and dysphoric mood.       Objective:   Physical Exam  Constitutional:  He is oriented to person, place, and time. He appears well-developed and well-nourished. No distress.  HENT:  Head: Normocephalic and atraumatic.  Right Ear: External ear normal.  Left Ear: External ear normal.  Mouth/Throat: Oropharynx is clear and moist.  Eyes: Conjunctivae and EOM are normal. Pupils are equal, round, and reactive to light.  Neck: Normal range of motion. Neck supple. No thyromegaly present.  Cardiovascular: Normal rate, regular rhythm and normal heart sounds.   No murmur heard. Pulmonary/Chest: No respiratory distress. He has no wheezes. He has no rales.  Abdominal: Soft. Bowel sounds are normal. He exhibits no distension and no mass. There is no tenderness. There is no rebound and no guarding.  Musculoskeletal: He exhibits no edema.  Lymphadenopathy:    He has no cervical adenopathy.  Neurological: He is alert and oriented to person, place, and time. He displays normal reflexes. No cranial nerve deficit.  Skin: No rash noted.  Psychiatric: He has a normal mood and affect.          Assessment & Plan:  Physical exam. Tetanus booster given. Flu vaccine already given. Labs reviewed. His hematocrit has been slowly increasing (on testosterone) but not at worrisome levels. Continue yearly monitoring. Increase consistency of exercise.

## 2015-06-17 NOTE — Progress Notes (Signed)
Pre visit review using our clinic review tool, if applicable. No additional management support is needed unless otherwise documented below in the visit note. 

## 2015-08-27 ENCOUNTER — Encounter: Payer: Self-pay | Admitting: Adult Health

## 2015-08-27 ENCOUNTER — Ambulatory Visit (INDEPENDENT_AMBULATORY_CARE_PROVIDER_SITE_OTHER): Payer: Managed Care, Other (non HMO) | Admitting: Adult Health

## 2015-08-27 VITALS — BP 94/60 | HR 76 | Temp 98.5°F | Ht 69.5 in | Wt 179.4 lb

## 2015-08-27 DIAGNOSIS — J0110 Acute frontal sinusitis, unspecified: Secondary | ICD-10-CM

## 2015-08-27 MED ORDER — AMOXICILLIN-POT CLAVULANATE 875-125 MG PO TABS: 1.0000 | ORAL_TABLET | Freq: Two times a day (BID) | ORAL | Status: DC

## 2015-08-27 NOTE — Progress Notes (Signed)
Subjective:    Patient ID: Kenneth Knapp, male    DOB: [birth year], 55 y.o.   MRN: [id number]  HPI  55 year old male, patient of Dr. Caryl Never, who I am seeing today for an acute issue of suspected sinus infection. Per patient he usually gets a sinus infection in February. His symptoms include that of sinus pain and pressure, nasal congestion and subjective fever.   He has been trying Saline nasal washes without relief.    Review of Systems  Constitutional: Positive for fever and fatigue.  HENT: Positive for congestion, ear pain (right) and sinus pressure. Negative for ear discharge, facial swelling, postnasal drip, rhinorrhea and sore throat.   Respiratory: Negative.   Skin: Negative.   Neurological: Positive for headaches.  Psychiatric/Behavioral: Positive for sleep disturbance.   Past Medical History  Diagnosis Date  . ALLERGIC RHINITIS 08/03/2007  . BENIGN PROSTATIC HYPERTROPHY 08/03/2007  . HYPERLIPIDEMIA 08/03/2007  . TESTICULAR HYPOFUNCTION 12/04/2009  . Chronic low back pain     Social History   Social History  . Marital Status: Married    Spouse Name: N/A  . Number of Children: N/A  . Years of Education: N/A   Occupational History  . Not on file.   Social History Main Topics  . Smoking status: Never Smoker   . Smokeless tobacco: Never Used  . Alcohol Use: No  . Drug Use: No  . Sexual Activity: Not on file   Other Topics Concern  . Not on file   Social History Narrative    Past Surgical History  Procedure Laterality Date  . Back surgery  2002  . Wisdom tooth extraction      Family History  Problem Relation Age of Onset  . ALS Mother   . Arthritis Mother   . Heart disease Father   . Stroke Father 80    hemorrhagic  . Colon cancer Paternal Grandmother   . Heart disease Paternal Grandfather     No Known Allergies  Current Outpatient Prescriptions on File Prior to Visit  Medication Sig Dispense Refill  . cyclobenzaprine (FLEXERIL) 10 MG  tablet Take 1 tablet (10 mg total) by mouth at bedtime. 30 tablet 0  . diclofenac (VOLTAREN) 75 MG EC tablet Take 1 tablet by mouth two  times daily 180 tablet 1  . fexofenadine (ALLEGRA) 180 MG tablet Take 180 mg by mouth daily.      Marland Kitchen lovastatin (MEVACOR) 20 MG tablet Take 1 tablet by mouth at  bedtime 90 tablet 1  . Multiple Vitamin (MULTIVITAMIN) tablet Take 1 tablet by mouth daily.    . propranolol (INDERAL) 10 MG tablet Take one tablet about one hour prior to stage performance. 30 tablet 3  . TESTIM 50 MG/5GM (1%) GEL APPLY 5 GRAMS ( 1 TUBE) TO SKIN DAILY 150 g 3  . zolpidem (AMBIEN) 5 MG tablet TAKE ONE TABLET BY MOUTH AT BEDTIME 30 tablet 5   No current facility-administered medications on file prior to visit.    BP 94/60 mmHg  Pulse 76  Temp(Src) 98.5 F (36.9 C) (Oral)  Ht 5' 9.5" (1.765 m)  Wt 179 lb 6.4 oz (81.375 kg)  BMI 26.12 kg/m2  SpO2 97%       Objective:   Physical Exam  Constitutional: He is oriented to person, place, and time. He appears well-developed and well-nourished. No distress.  HENT:  Head: Normocephalic and atraumatic.  Right Ear: External ear normal.  Left Ear: External ear normal.  Nose:  Nose normal.  Mouth/Throat: Oropharynx is clear and moist. No oropharyngeal exudate.  Edema in nasal passages R>L  Eyes: Conjunctivae and EOM are normal. Pupils are equal, round, and reactive to light. Right eye exhibits no discharge. Left eye exhibits no discharge. No scleral icterus.  Neck: Normal range of motion. Neck supple.  Cardiovascular: Normal rate, regular rhythm, normal heart sounds and intact distal pulses.  Exam reveals no gallop and no friction rub.   No murmur heard. Pulmonary/Chest: Effort normal and breath sounds normal. No respiratory distress. He has no wheezes. He has no rales. He exhibits no tenderness.  Lymphadenopathy:    He has cervical adenopathy (right cervical ).  Neurological: He is alert and oriented to person, place, and time.    Skin: Skin is warm and dry. No rash noted. He is not diaphoretic. No erythema. No pallor.  Psychiatric: He has a normal mood and affect. His behavior is normal. Judgment and thought content normal.  Nursing note and vitals reviewed.     Assessment & Plan:  1. Acute frontal sinusitis, recurrence not specified - amoxicillin-clavulanate (AUGMENTIN) 875-125 MG tablet; Take 1 tablet by mouth 2 (two) times daily.  Dispense: 14 tablet; Refill: 0 - Flonase or Nasocort - Stay hydrated - Follow up if no improvement in the next 2-3 days.

## 2015-08-27 NOTE — Patient Instructions (Signed)
It was great seeing you again!  I have sent in a prescription for Augmentin to the pharmacy. Please take twice a day for 7 days.   If you are not feeling any better, please let myself or Dr. Caryl Never know.   Sinusitis, Adult Sinusitis is redness, soreness, and inflammation of the paranasal sinuses. Paranasal sinuses are air pockets within the bones of your face. They are located beneath your eyes, in the middle of your forehead, and above your eyes. In healthy paranasal sinuses, mucus is able to drain out, and air is able to circulate through them by way of your nose. However, when your paranasal sinuses are inflamed, mucus and air can become trapped. This can allow bacteria and other germs to grow and cause infection. Sinusitis can develop quickly and last only a short time (acute) or continue over a long period (chronic). Sinusitis that lasts for more than 12 weeks is considered chronic. CAUSES Causes of sinusitis include:  Allergies.  Structural abnormalities, such as displacement of the cartilage that separates your nostrils (deviated septum), which can decrease the air flow through your nose and sinuses and affect sinus drainage.  Functional abnormalities, such as when the small hairs (cilia) that line your sinuses and help remove mucus do not work properly or are not present. SIGNS AND SYMPTOMS Symptoms of acute and chronic sinusitis are the same. The primary symptoms are pain and pressure around the affected sinuses. Other symptoms include:  Upper toothache.  Earache.  Headache.  Bad breath.  Decreased sense of smell and taste.  A cough, which worsens when you are lying flat.  Fatigue.  Fever.  Thick drainage from your nose, which often is green and may contain pus (purulent).  Swelling and warmth over the affected sinuses. DIAGNOSIS Your health care provider will perform a physical exam. During your exam, your health care provider may perform any of the following to  help determine if you have acute sinusitis or chronic sinusitis:  Look in your nose for signs of abnormal growths in your nostrils (nasal polyps).  Tap over the affected sinus to check for signs of infection.  View the inside of your sinuses using an imaging device that has a light attached (endoscope). If your health care provider suspects that you have chronic sinusitis, one or more of the following tests may be recommended:  Allergy tests.  Nasal culture. A sample of mucus is taken from your nose, sent to a lab, and screened for bacteria.  Nasal cytology. A sample of mucus is taken from your nose and examined by your health care provider to determine if your sinusitis is related to an allergy. TREATMENT Most cases of acute sinusitis are related to a viral infection and will resolve on their own within 10 days. Sometimes, medicines are prescribed to help relieve symptoms of both acute and chronic sinusitis. These may include pain medicines, decongestants, nasal steroid sprays, or saline sprays. However, for sinusitis related to a bacterial infection, your health care provider will prescribe antibiotic medicines. These are medicines that will help kill the bacteria causing the infection. Rarely, sinusitis is caused by a fungal infection. In these cases, your health care provider will prescribe antifungal medicine. For some cases of chronic sinusitis, surgery is needed. Generally, these are cases in which sinusitis recurs more than 3 times per year, despite other treatments. HOME CARE INSTRUCTIONS  Drink plenty of water. Water helps thin the mucus so your sinuses can drain more easily.  Use a humidifier.  Inhale steam 3-4 times a day (for example, sit in the bathroom with the shower running).  Apply a warm, moist washcloth to your face 3-4 times a day, or as directed by your health care provider.  Use saline nasal sprays to help moisten and clean your sinuses.  Take medicines only as  directed by your health care provider.  If you were prescribed either an antibiotic or antifungal medicine, finish it all even if you start to feel better. SEEK IMMEDIATE MEDICAL CARE IF:  You have increasing pain or severe headaches.  You have nausea, vomiting, or drowsiness.  You have swelling around your face.  You have vision problems.  You have a stiff neck.  You have difficulty breathing.   This information is not intended to replace advice given to you by your health care provider. Make sure you discuss any questions you have with your health care provider.   Document Released: 07/06/2005 Document Revised: 07/27/2014 Document Reviewed: 07/21/2011 Elsevier Interactive Patient Education [employer].

## 2015-08-27 NOTE — Progress Notes (Signed)
Pre visit review using our clinic review tool, if applicable. No additional management support is needed unless otherwise documented below in the visit note. 

## 2015-09-14 ENCOUNTER — Other Ambulatory Visit: Payer: Self-pay | Admitting: Family Medicine

## 2015-09-16 NOTE — Telephone Encounter (Signed)
Refill  For 6 months.

## 2015-09-16 NOTE — Telephone Encounter (Signed)
Ok to refill 

## 2015-09-17 NOTE — Telephone Encounter (Signed)
Rx faxed

## 2015-09-21 ENCOUNTER — Other Ambulatory Visit: Payer: Self-pay | Admitting: Family Medicine

## 2015-09-24 ENCOUNTER — Other Ambulatory Visit: Payer: Self-pay | Admitting: Family Medicine

## 2015-09-25 ENCOUNTER — Telehealth: Payer: Self-pay | Admitting: Family Medicine

## 2015-09-25 NOTE — Telephone Encounter (Signed)
Pt request refill  testosterone (ANDROGEL) 50 MG/5GM (1%) GEL  [facility] [address] say they never received a rx for this med. Can you resend?

## 2015-09-26 NOTE — Telephone Encounter (Signed)
Verbally called into the pharmacy.  

## 2015-10-21 ENCOUNTER — Other Ambulatory Visit: Payer: Self-pay | Admitting: Family Medicine

## 2015-10-21 MED ORDER — DICLOFENAC SODIUM 75 MG PO TBEC: DELAYED_RELEASE_TABLET | ORAL | Status: DC

## 2015-10-21 NOTE — Telephone Encounter (Signed)
Pt request refill of the following: diclofenac (VOLTAREN) 75 MG EC tablet   Phamacy: [facility] [address]

## 2015-10-21 NOTE — Telephone Encounter (Signed)
Rx sent to pharmacy   

## 2015-11-01 ENCOUNTER — Other Ambulatory Visit: Payer: Self-pay | Admitting: Family Medicine

## 2015-12-31 ENCOUNTER — Other Ambulatory Visit: Payer: Self-pay | Admitting: Family Medicine

## 2016-01-28 ENCOUNTER — Other Ambulatory Visit: Payer: Self-pay | Admitting: Family Medicine

## 2016-03-04 ENCOUNTER — Telehealth: Payer: Self-pay | Admitting: Family Medicine

## 2016-03-04 DIAGNOSIS — E785 Hyperlipidemia, unspecified: Secondary | ICD-10-CM

## 2016-03-04 DIAGNOSIS — E291 Testicular hypofunction: Secondary | ICD-10-CM

## 2016-03-04 DIAGNOSIS — Z0000 Encounter for general adult medical examination without abnormal findings: Secondary | ICD-10-CM

## 2016-03-04 NOTE — Telephone Encounter (Signed)
Last lab check was 05/09/2014 Last OV acute 08/27/2015 Last CPE 06/17/2015 Please advise

## 2016-03-04 NOTE — Telephone Encounter (Signed)
Pt would like to have a order to check this testosterone level.

## 2016-03-05 NOTE — Telephone Encounter (Signed)
We can check total testosterone, but he will need other labs by November including PSA and CBC

## 2016-03-06 NOTE — Telephone Encounter (Signed)
I called the pt and informed him of the message below.  Patient stated he will await a physical exam appt and have all labs done at that time and I transferred him to [facility] to schedule this.

## 2016-04-01 ENCOUNTER — Ambulatory Visit (INDEPENDENT_AMBULATORY_CARE_PROVIDER_SITE_OTHER): Payer: Managed Care, Other (non HMO) | Admitting: Cardiovascular Disease

## 2016-04-01 ENCOUNTER — Encounter: Payer: Self-pay | Admitting: Cardiovascular Disease

## 2016-04-01 VITALS — BP 94/54 | HR 66 | Ht 71.0 in | Wt 178.1 lb

## 2016-04-01 DIAGNOSIS — Z8249 Family history of ischemic heart disease and other diseases of the circulatory system: Secondary | ICD-10-CM

## 2016-04-01 DIAGNOSIS — E785 Hyperlipidemia, unspecified: Secondary | ICD-10-CM

## 2016-04-01 DIAGNOSIS — R42 Dizziness and giddiness: Secondary | ICD-10-CM

## 2016-04-01 DIAGNOSIS — R072 Precordial pain: Secondary | ICD-10-CM | POA: Diagnosis not present

## 2016-04-01 NOTE — Patient Instructions (Signed)
Schedule Treadmill   Schedule Echo   Fasting Lab Work ( cmet,cbc,lipid panel,tsh )   Your physician recommends that you schedule a follow-up appointment after tests.

## 2016-04-06 NOTE — Progress Notes (Signed)
Primary MD: Dr. Carolann Littler  PATIENT PROFILE: Kenneth Knapp is a 55 y.o. male who is self-referred for evaluation of chest and left arm tingling with intermittent dizzy sensation.   HPI:  Kenneth Knapp denies any known history of significant cardiac disease.  He has a family history for heart disease in that his father had undergone 2 surgical operations for CABG revascularization and ultimately died at age 38 following a hemorrhagic stroke.  The patient has a history of mild hyperlipidemia and has been on lovastatin 20 mg by's primary M.D.  For the past 6-9 months he has noticed rare intermittent episodes of a tingly sensation in his left chest and arm, associated with transient dizziness.  Sometimes he has noticed prominent breathing with activity.  However, he is able to exercise and at the gym is able to do body pump exercise without chest pain, development, palpitations, or significant shortness of breath.  He denies significant visual blurring.  He denies awareness of slow or fast heartbeats.  In 2006.  He had undergone a carotid duplex study, which was entirely normal.  Because of his recurrent symptoms.  He presents for cardiology evaluation.  Past Medical History:  Diagnosis Date  . ALLERGIC RHINITIS 08/03/2007  . BENIGN PROSTATIC HYPERTROPHY 08/03/2007  . Chronic low back pain   . HYPERLIPIDEMIA 08/03/2007  . TESTICULAR HYPOFUNCTION 12/04/2009    Past Surgical History:  Procedure Laterality Date  . BACK SURGERY  2002  . WISDOM TOOTH EXTRACTION      No Known Allergies  Current Outpatient Prescriptions  Medication Sig Dispense Refill  . cyclobenzaprine (FLEXERIL) 10 MG tablet Take 1 tablet (10 mg total) by mouth at bedtime. 30 tablet 0  . diclofenac (VOLTAREN) 75 MG EC tablet Take 1 tablet by mouth two  times daily 180 tablet 1  . fexofenadine (ALLEGRA) 180 MG tablet Take 180 mg by mouth daily.      Marland Kitchen lovastatin (MEVACOR) 20 MG tablet TAKE ONE TABLET BY MOUTH AT  BEDTIME 90 tablet 1  . Multiple Vitamin (MULTIVITAMIN) tablet Take 1 tablet by mouth daily.    . propranolol (INDERAL) 10 MG tablet Take one tablet about one hour prior to stage performance. 30 tablet 3  . testosterone (ANDROGEL) 50 MG/5GM (1%) GEL APPLY 5 GRAMS TO THE SKIN EVERY DAY 150 g 3  . zolpidem (AMBIEN) 5 MG tablet TAKE ONE TABLET BY MOUTH AT BEDTIME 30 tablet 5  . EPINEPHrine 0.3 mg/0.3 mL IJ SOAJ injection Use as needed     No current facility-administered medications for this visit.     Social History   Social History  . Marital status: Married    Spouse name: N/A  . Number of children: N/A  . Years of education: N/A   Occupational History  . Not on file.   Social History Main Topics  . Smoking status: Never Smoker  . Smokeless tobacco: Never Used  . Alcohol use No  . Drug use: No  . Sexual activity: Not on file   Other Topics Concern  . Not on file   Social History Narrative  . No narrative on file   Additional social history is notable in that he is the Development worker, international aid at [employer].  He is married for 33 years.  He has 2 children.  His parents had lived in my neighborhood.  His sister is Kenneth Knapp who works at [employer].  Family History  Problem Relation Age of Onset  .  ALS Mother   . Arthritis Mother   . Heart disease Father   . Stroke Father 77    hemorrhagic  . Colon cancer Paternal Grandmother   . Heart disease Paternal Grandfather    Family history is notable that his mother died at age 23 and had ALS.  Father died at age 59 after hemorrhagic stroke but had undergone 2 prior bypass operations with his initial CABG surgery at age 28.  ROS General: Negative; No fevers, chills, or night sweats HEENT: Negative; No changes in vision or hearing, sinus congestion, difficulty swallowing Pulmonary: Negative; No cough, wheezing, shortness of breath, hemoptysis Cardiovascular:  See HPI;  GI: Negative; No nausea, vomiting, diarrhea, or  abdominal pain GU: Negative; No dysuria, hematuria, or difficulty voiding Musculoskeletal: Negative; no myalgias, joint pain, or weakness Hematologic/Oncologic: Negative; no easy bruising, bleeding Endocrine: Negative; no heat/cold intolerance; no diabetes Neuro: Negative; no changes in balance, headaches Skin: Negative; No rashes or skin lesions Psychiatric: Negative; No behavioral problems, depression Sleep: Negative; No daytime sleepiness, hypersomnolence, bruxism, restless legs, hypnogagnic hallucinations Other comprehensive 14 point system review is negative   Physical Exam BP (!) 94/54 (BP Location: Left Arm, Patient Position: Sitting, Cuff Size: Normal)   Pulse 66   Ht _0  (1.803 m)   Wt 178 lb 2 oz (80.8 kg)   BMI 24.84 kg/m   Wt Readings from Last 3 Encounters:  04/01/16 178 lb 2 oz (80.8 kg)  08/27/15 179 lb 6.4 oz (81.4 kg)  06/17/15 175 lb 12.8 oz (79.7 kg)   General: Alert, oriented, no distress.  Skin: normal turgor, no rashes, warm and dry HEENT: Normocephalic, atraumatic. Pupils equal round and reactive to light; sclera anicteric; extraocular muscles intact; Fundi WNL. Nose without nasal septal hypertrophy Mouth/Parynx benign; Mallinpatti scale 2 Neck: No JVD, no carotid bruits; normal carotid upstroke Lungs: clear to ausculatation and percussion; no wheezing or rales Chest wall: without tenderness to palpitation Heart: PMI not displaced, RRR, s1 s2 normal, faint 1/6 systolic murmur, no diastolic murmur, no rubs, gallops, thrills, or heaves Abdomen: soft, nontender; no hepatosplenomehaly, BS+; abdominal aorta nontender and not dilated by palpation. Back: no CVA tenderness Pulses 2+ Musculoskeletal: full range of motion, normal strength, no joint deformities Extremities: no clubbing cyanosis or edema, Homan's sign negative  Neurologic: grossly nonfocal; Cranial nerves grossly wnl Psychologic: Normal mood and affect   ECG (independently read by me): NSR at  66; PR interval 198 ms, QTc interval 377 ms.  No significant ST-T change.  Nondiagnostic T changes in  lead 3.  LABS:  BMP Latest Ref Rng & Units 06/11/2015 05/09/2014 06/15/2011  Glucose 70 - 99 mg/dL 87 88 89  BUN 6 - 23 mg/dL _1 Creatinine 0.40 - 1.50 mg/dL 0.91 1.0 0.9  Sodium 135 - 145 mEq/L 139 140 141  Potassium 3.5 - 5.1 mEq/L 4.8 4.8 4.9  Chloride 96 - 112 mEq/L 101 106 105  CO2 19 - 32 mEq/L _2 Calcium 8.4 - 10.5 mg/dL 9.6 9.3 9.4     Hepatic Function Latest Ref Rng & Units 06/11/2015 05/09/2014 02/13/2013  Total Protein 6.0 - 8.3 g/dL 7.0 7.1 6.6  Albumin 3.5 - 5.2 g/dL 4.4 3.9 4.1  AST 0 - 37 U/L 28 36 27  ALT 0 - 53 U/L 36 34 25  Alk Phosphatase 39 - 117 U/L 79 67 58  Total Bilirubin 0.2 - 1.2 mg/dL 1.0 1.3(H) 1.1  Bilirubin, Direct 0.0 - 0.3 mg/dL  0.2 0.1 0.2    CBC Latest Ref Rng & Units 06/11/2015 05/09/2014 06/15/2011  WBC 4.0 - 10.5 K/uL 6.8 7.3 8.8  Hemoglobin 13.0 - 17.0 g/dL 17.0 16.2 15.8  Hematocrit 39.0 - 52.0 % 51.3 48.2 46.3  Platelets 150.0 - 400.0 K/uL 205.0 232.0 218.0   Lab Results  Component Value Date   MCV 92.7 06/11/2015   MCV 91.8 05/09/2014   MCV 93.4 06/15/2011   Lab Results  Component Value Date   TSH 2.84 06/11/2015   No results found for: HGBA1C   BNP No results found for: BNP  ProBNP No results found for: PROBNP   Lipid Panel     Component Value Date/Time   CHOL 175 06/11/2015 0801   TRIG 124.0 06/11/2015 0801   HDL 45.00 06/11/2015 0801   CHOLHDL 4 06/11/2015 0801   VLDL 24.8 06/11/2015 0801   LDLCALC 105 (H) 06/11/2015 0801    RADIOLOGY: No results found.   ASSESSMENT AND PLAN: Kenneth Knapp is a 55 year old Caucasian male who has noticed vague episodes of tingling in his left arm and chest and vague sensation of dizziness.  He denies any clear-cut exertional precipitation to chest tightness but at times he does note his breathing is morbidly pronounced with activity.  Remotely he had a normal  carotid duplex study.  On physical exam today he is not orthostatic and blood pressure was 110/64 supine and 110/60 standing.  His symptom complex.  Sounds atypical for CAD area and he is unaware of any dysrhythmia or bradycardic episodes prior to this transient dizziness.  He seems to be able to exercise in a body pump class without difficulty.  I have recommended that he undergo a 2-D echo Doppler study to evaluate both systolic, diastolic function and valvular architecture.  With his family history for premature CAD.  I have recommended at least he undergo a screening routine graded exercise treadmill study for risk stratification.  I have reviewed  laboratory from his primary physician from last year.  He has been on Mevacor for hyperlipidemia and LDL in November 2016 was 105. I have recommended repeat fasting laboratory be obtained consisting of a comprehensive metabolic panel, CBC, lipid panel, and TSH. I will see him in the office in follow-up of the above tests and further recommendations will be made at that time.Troy Sine, MD, [facility] 04/06/2016 4:47 PM

## 2016-04-07 LAB — CBC WITH DIFFERENTIAL/PLATELET
BASOS ABS: 0 {cells}/uL (ref 0–200)
Basophils Relative: 0 %
EOS ABS: 212 {cells}/uL (ref 15–500)
Eosinophils Relative: 4 %
HEMATOCRIT: 47.5 % (ref 38.5–50.0)
Hemoglobin: 16 g/dL (ref 13.2–17.1)
Lymphocytes Relative: 29 %
Lymphs Abs: 1537 cells/uL (ref 850–3900)
MCH: 30.8 pg (ref 27.0–33.0)
MCHC: 33.7 g/dL (ref 32.0–36.0)
MCV: 91.3 fL (ref 80.0–100.0)
MONO ABS: 371 {cells}/uL (ref 200–950)
MONOS PCT: 7 %
MPV: 11 fL (ref 7.5–12.5)
NEUTROS ABS: 3180 {cells}/uL (ref 1500–7800)
Neutrophils Relative %: 60 %
PLATELETS: 229 10*3/uL (ref 140–400)
RBC: 5.2 MIL/uL (ref 4.20–5.80)
RDW: 13.2 % (ref 11.0–15.0)
WBC: 5.3 10*3/uL (ref 3.8–10.8)

## 2016-04-07 LAB — COMPREHENSIVE METABOLIC PANEL
ALT: 31 U/L (ref 9–46)
AST: 31 U/L (ref 10–35)
Albumin: 4.2 g/dL (ref 3.6–5.1)
Alkaline Phosphatase: 62 U/L (ref 40–115)
BILIRUBIN TOTAL: 0.9 mg/dL (ref 0.2–1.2)
BUN: 14 mg/dL (ref 7–25)
CO2: 31 mmol/L (ref 20–31)
CREATININE: 0.81 mg/dL (ref 0.70–1.33)
Calcium: 9.2 mg/dL (ref 8.6–10.3)
Chloride: 104 mmol/L (ref 98–110)
GLUCOSE: 99 mg/dL (ref 65–99)
Potassium: 5.2 mmol/L (ref 3.5–5.3)
SODIUM: 140 mmol/L (ref 135–146)
Total Protein: 6.6 g/dL (ref 6.1–8.1)

## 2016-04-07 LAB — LIPID PANEL
CHOL/HDL RATIO: 3.2 ratio (ref <=5.0)
Cholesterol: 145 mg/dL (ref 125–200)
HDL: 46 mg/dL (ref >=40)
LDL CALC: 84 mg/dL (ref <130)
Triglycerides: 73 mg/dL (ref <150)
VLDL: 15 mg/dL (ref <30)

## 2016-04-07 LAB — TSH: TSH: 2.37 m[IU]/L (ref 0.40–4.50)

## 2016-04-08 ENCOUNTER — Telehealth (HOSPITAL_COMMUNITY): Payer: Self-pay

## 2016-04-08 NOTE — Telephone Encounter (Signed)
Encounter complete. 

## 2016-04-10 ENCOUNTER — Ambulatory Visit (HOSPITAL_COMMUNITY)
Admission: RE | Admit: 2016-04-10 | Discharge: 2016-04-10 | Disposition: A | Discharge status: Home / Self Care | Payer: Managed Care, Other (non HMO) | Source: Ambulatory Visit | Attending: Cardiology | Admitting: Cardiology

## 2016-04-10 DIAGNOSIS — R072 Precordial pain: Secondary | ICD-10-CM | POA: Insufficient documentation

## 2016-04-10 LAB — EXERCISE TOLERANCE TEST
CHL CUP MPHR: 166 {beats}/min
CHL CUP RESTING HR STRESS: 63 {beats}/min
CSEPEDS: 35 s
CSEPPHR: 166 {beats}/min
Estimated workload: 16.4 METS
Exercise duration (min): 13 min
Percent HR: 100 %
RPE: 17

## 2016-04-15 ENCOUNTER — Other Ambulatory Visit: Payer: Self-pay

## 2016-04-15 ENCOUNTER — Ambulatory Visit (HOSPITAL_COMMUNITY): Payer: Managed Care, Other (non HMO) | Attending: Cardiovascular Disease

## 2016-04-15 DIAGNOSIS — R072 Precordial pain: Secondary | ICD-10-CM | POA: Diagnosis present

## 2016-04-15 DIAGNOSIS — I517 Cardiomegaly: Secondary | ICD-10-CM | POA: Insufficient documentation

## 2016-04-20 ENCOUNTER — Telehealth: Payer: Self-pay | Admitting: Cardiovascular Disease

## 2016-04-20 NOTE — Telephone Encounter (Signed)
--  Notified the pt of echo results, as mentioned below, per Dr Claiborne Billings.   Pt verbalized understanding.  Notes Recorded by Troy Sine, MD on 04/20/2016 at 7:45 AM EDT Nl LV fxn; EF 60 - 65%; Nl echo      --Notified the pt of GXT results per Dr Claiborne Billings, as mentioned below. Pt verbalized understanding.  Notes Recorded by Troy Sine, MD on 04/12/2016 at 10:14 PM EDT Mildly positive ECG changes at a very high workload and brisk return to normal; low specificity ------ Notes Recorded by Troy Sine, MD on 04/12/2016 at 10:11 PM EDT Excellent exercise capacity; No chest pain ; mild ST changes at high 16 met workload and brisk return to normal in recovery; low risk with low specificity.

## 2016-04-20 NOTE — Telephone Encounter (Signed)
New message  ° ° °Patient calling for test results.   °

## 2016-04-28 ENCOUNTER — Telehealth: Payer: Self-pay

## 2016-04-28 NOTE — Telephone Encounter (Signed)
PA approved, pharmacy aware and will fill.

## 2016-04-28 NOTE — Telephone Encounter (Signed)
Received PA request for testosterone 1% gel from [facility]. PA submitted & is pending. Key: [id number]

## 2016-05-04 ENCOUNTER — Other Ambulatory Visit: Payer: Self-pay | Admitting: Family Medicine

## 2016-05-27 ENCOUNTER — Other Ambulatory Visit: Payer: Self-pay | Admitting: Family Medicine

## 2016-05-29 ENCOUNTER — Other Ambulatory Visit: Payer: Self-pay | Admitting: Family Medicine

## 2016-06-10 ENCOUNTER — Other Ambulatory Visit (INDEPENDENT_AMBULATORY_CARE_PROVIDER_SITE_OTHER): Payer: Managed Care, Other (non HMO)

## 2016-06-10 DIAGNOSIS — R972 Elevated prostate specific antigen [PSA]: Secondary | ICD-10-CM

## 2016-06-10 DIAGNOSIS — E785 Hyperlipidemia, unspecified: Secondary | ICD-10-CM

## 2016-06-10 DIAGNOSIS — Z0000 Encounter for general adult medical examination without abnormal findings: Secondary | ICD-10-CM

## 2016-06-10 LAB — BASIC METABOLIC PANEL
BUN: 15 mg/dL (ref 6–23)
CALCIUM: 9.2 mg/dL (ref 8.4–10.5)
CHLORIDE: 104 meq/L (ref 96–112)
CO2: 28 meq/L (ref 19–32)
Creatinine, Ser: 0.89 mg/dL (ref 0.40–1.50)
GFR: 94.26 mL/min (ref >60.00)
Glucose, Bld: 92 mg/dL (ref 70–99)
Potassium: 4.5 mEq/L (ref 3.5–5.1)
SODIUM: 140 meq/L (ref 135–145)

## 2016-06-10 LAB — LIPID PANEL
Cholesterol: 182 mg/dL (ref 0–200)
HDL: 48.2 mg/dL (ref >39.00)
LDL Cholesterol: 112 mg/dL — ABNORMAL HIGH (ref 0–99)
NONHDL: 133.52
Total CHOL/HDL Ratio: 4
Triglycerides: 108 mg/dL (ref 0.0–149.0)
VLDL: 21.6 mg/dL (ref 0.0–40.0)

## 2016-06-10 LAB — CBC WITH DIFFERENTIAL/PLATELET
BASOS ABS: 0 10*3/uL (ref 0.0–0.1)
Basophils Relative: 0.4 % (ref 0.0–3.0)
EOS PCT: 4.9 % (ref 0.0–5.0)
Eosinophils Absolute: 0.4 10*3/uL (ref 0.0–0.7)
HEMATOCRIT: 48.8 % (ref 39.0–52.0)
HEMOGLOBIN: 16.6 g/dL (ref 13.0–17.0)
LYMPHS ABS: 1.9 10*3/uL (ref 0.7–4.0)
LYMPHS PCT: 24.3 % (ref 12.0–46.0)
MCHC: 34 g/dL (ref 30.0–36.0)
MCV: 91.4 fl (ref 78.0–100.0)
MONOS PCT: 6.4 % (ref 3.0–12.0)
Monocytes Absolute: 0.5 10*3/uL (ref 0.1–1.0)
Neutro Abs: 5 10*3/uL (ref 1.4–7.7)
Neutrophils Relative %: 64 % (ref 43.0–77.0)
Platelets: 233 10*3/uL (ref 150.0–400.0)
RBC: 5.34 Mil/uL (ref 4.22–5.81)
RDW: 13 % (ref 11.5–15.5)
WBC: 7.8 10*3/uL (ref 4.0–10.5)

## 2016-06-10 LAB — HEPATIC FUNCTION PANEL
ALK PHOS: 69 U/L (ref 39–117)
ALT: 31 U/L (ref 0–53)
AST: 25 U/L (ref 0–37)
Albumin: 4.4 g/dL (ref 3.5–5.2)
BILIRUBIN DIRECT: 0.1 mg/dL (ref 0.0–0.3)
TOTAL PROTEIN: 6.7 g/dL (ref 6.0–8.3)
Total Bilirubin: 0.9 mg/dL (ref 0.2–1.2)

## 2016-06-10 LAB — TESTOSTERONE: TESTOSTERONE: 444.61 ng/dL (ref 300.00–890.00)

## 2016-06-10 LAB — TSH: TSH: 2.81 u[IU]/mL (ref 0.35–4.50)

## 2016-06-10 LAB — PSA: PSA: 1.47 ng/mL (ref 0.10–4.00)

## 2016-06-17 ENCOUNTER — Ambulatory Visit (INDEPENDENT_AMBULATORY_CARE_PROVIDER_SITE_OTHER): Payer: Managed Care, Other (non HMO) | Admitting: Family Medicine

## 2016-06-17 ENCOUNTER — Encounter: Payer: Self-pay | Admitting: Family Medicine

## 2016-06-17 VITALS — BP 90/60 | HR 83 | Temp 97.9°F | Ht 71.0 in | Wt 173.0 lb

## 2016-06-17 DIAGNOSIS — R7989 Other specified abnormal findings of blood chemistry: Secondary | ICD-10-CM

## 2016-06-17 DIAGNOSIS — Z0000 Encounter for general adult medical examination without abnormal findings: Secondary | ICD-10-CM

## 2016-06-17 DIAGNOSIS — R972 Elevated prostate specific antigen [PSA]: Secondary | ICD-10-CM

## 2016-06-17 DIAGNOSIS — E349 Endocrine disorder, unspecified: Secondary | ICD-10-CM

## 2016-06-17 MED ORDER — TADALAFIL 20 MG PO TABS: 10.0000 mg | ORAL_TABLET | ORAL | 11 refills | Status: DC | PRN

## 2016-06-17 NOTE — Progress Notes (Signed)
Subjective:     Patient ID: Kenneth ChengKenneth A Fordham, male   DOB: Oct 23, 1960, 55 y.o.   MRN: [id number]  HPI Patient seen for complete physical. 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 He still has some fatigue at times. Had some progressive issues with erectile dysfunction during the past year. Libido is fair. He some chronic insomnia and takes low-dose Ambien as needed. He has hyperlipidemia treated with lovastatin. He had recent stress test which was unremarkable and this was done per cardiology. He had no recent chest pains over the past couple months. Exercises fairly regularly. Nonsmoker. Immunizations up-to-date. Recent dermatology checkup 4 months ago with no concerning lesions  Past Medical History:  Diagnosis Date  . ALLERGIC RHINITIS 08/03/2007  . BENIGN PROSTATIC HYPERTROPHY 08/03/2007  . Chronic low back pain   . HYPERLIPIDEMIA 08/03/2007  . TESTICULAR HYPOFUNCTION 12/04/2009   Past Surgical History:  Procedure Laterality Date  . BACK SURGERY  2002  . WISDOM TOOTH EXTRACTION      reports that he has never smoked. He has never used smokeless tobacco. He reports that he does not drink alcohol or use drugs. family history includes ALS in his mother; Arthritis in his mother; Colon cancer in his paternal grandmother; Heart disease in his father and paternal grandfather; Stroke (age of onset: 6773) in his father. No Known Allergies   Review of Systems  Constitutional: Negative for activity change, appetite change, fatigue, fever and unexpected weight change.  HENT: Negative for congestion, ear pain and trouble swallowing.   Eyes: Negative for pain and visual disturbance.  Respiratory: Negative for cough, shortness of breath and wheezing.   Cardiovascular: Negative for chest pain and palpitations.  Gastrointestinal: Negative for abdominal distention, abdominal pain, blood in stool, constipation, diarrhea, nausea, rectal pain and vomiting.  Endocrine: Negative for polydipsia and  polyuria.  Genitourinary: Negative for dysuria, hematuria and testicular pain.  Musculoskeletal: Negative for arthralgias and joint swelling.  Skin: Negative for rash.  Neurological: Negative for dizziness, syncope and headaches.  Hematological: Negative for adenopathy.  Psychiatric/Behavioral: Negative for confusion and dysphoric mood.       Objective:   Physical Exam  Constitutional: He is oriented to person, place, and time. He appears well-developed and well-nourished. No distress.  HENT:  Head: Normocephalic and atraumatic.  Right Ear: External ear normal.  Left Ear: External ear normal.  Mouth/Throat: Oropharynx is clear and moist.  Eyes: Conjunctivae and EOM are normal. Pupils are equal, round, and reactive to light.  Neck: Normal range of motion. Neck supple. No thyromegaly present.  Cardiovascular: Normal rate, regular rhythm and normal heart sounds.   No murmur heard. Pulmonary/Chest: No respiratory distress. He has no wheezes. He has no rales.  Abdominal: Soft. Bowel sounds are normal. He exhibits no distension and no mass. There is no tenderness. There is no rebound and no guarding.  Musculoskeletal: He exhibits no edema.  Lymphadenopathy:    He has no cervical adenopathy.  Neurological: He is alert and oriented to person, place, and time. He displays normal reflexes. No cranial nerve deficit.  Skin: No rash noted.  Psychiatric: He has a normal mood and affect.       Assessment:     Physical exam. Labs reviewed with no major concerns. He's had some issues with erectile dysfunction but recent testosterone level normal. PSA increased velocity at 1.47 compared to 0.7 last year. Prostate exam unremarkable    Plan:     -Trial of Cialis 20 mg one half to  one tablet every other day as needed -Repeat PSA in about 5-6 months -If PSA climbing at that point consider urology referral  Kristian CoveyBruce W Deretha Ertle MD [facility]

## 2016-06-17 NOTE — Patient Instructions (Signed)
Remember to set up repeat PSA in about 5 months.

## 2016-06-17 NOTE — Progress Notes (Signed)
Pre visit review using our clinic review tool, if applicable. No additional management support is needed unless otherwise documented below in the visit note. 

## 2016-07-01 ENCOUNTER — Ambulatory Visit: Payer: Managed Care, Other (non HMO) | Admitting: Cardiovascular Disease

## 2016-07-12 ENCOUNTER — Other Ambulatory Visit: Payer: Self-pay | Admitting: Family Medicine

## 2016-07-14 NOTE — Telephone Encounter (Signed)
Refill Lovastatin for one year.  Refill Ambien for 6 months.

## 2016-07-14 NOTE — Telephone Encounter (Signed)
Last filled 01/01/16 # 30 refill 5 Last OV 06/17/16   Okay to refills?

## 2016-07-21 DIAGNOSIS — J3089 Other allergic rhinitis: Secondary | ICD-10-CM | POA: Diagnosis not present

## 2016-07-21 DIAGNOSIS — J3081 Allergic rhinitis due to animal (cat) (dog) hair and dander: Secondary | ICD-10-CM | POA: Diagnosis not present

## 2016-07-21 DIAGNOSIS — J301 Allergic rhinitis due to pollen: Secondary | ICD-10-CM | POA: Diagnosis not present

## 2016-07-29 DIAGNOSIS — J3081 Allergic rhinitis due to animal (cat) (dog) hair and dander: Secondary | ICD-10-CM | POA: Diagnosis not present

## 2016-07-29 DIAGNOSIS — J3089 Other allergic rhinitis: Secondary | ICD-10-CM | POA: Diagnosis not present

## 2016-07-29 DIAGNOSIS — J301 Allergic rhinitis due to pollen: Secondary | ICD-10-CM | POA: Diagnosis not present

## 2016-08-01 DIAGNOSIS — J019 Acute sinusitis, unspecified: Secondary | ICD-10-CM | POA: Diagnosis not present

## 2016-08-03 ENCOUNTER — Other Ambulatory Visit: Payer: Self-pay

## 2016-08-03 MED ORDER — TESTOSTERONE 50 MG/5GM (1%) TD GEL: TRANSDERMAL | 3 refills | Status: DC

## 2016-08-10 ENCOUNTER — Telehealth: Payer: Self-pay

## 2016-08-10 DIAGNOSIS — E349 Endocrine disorder, unspecified: Secondary | ICD-10-CM

## 2016-08-10 NOTE — Telephone Encounter (Signed)
[facility] PA submitted & is pending. Key:  [id number] [id number]

## 2016-08-12 NOTE — Telephone Encounter (Signed)
Forms with testosterone labs faxed back to insurance company.

## 2016-08-14 ENCOUNTER — Other Ambulatory Visit: Payer: Self-pay

## 2016-08-14 DIAGNOSIS — J3089 Other allergic rhinitis: Secondary | ICD-10-CM | POA: Diagnosis not present

## 2016-08-14 DIAGNOSIS — J3081 Allergic rhinitis due to animal (cat) (dog) hair and dander: Secondary | ICD-10-CM | POA: Diagnosis not present

## 2016-08-14 DIAGNOSIS — J301 Allergic rhinitis due to pollen: Secondary | ICD-10-CM | POA: Diagnosis not present

## 2016-08-17 NOTE — Telephone Encounter (Signed)
PA denied. Only one alternative has been tried. Preferred are: Androgel 1.62%, testosterone 1%, testosterone solution (generic Axiron).

## 2016-08-18 NOTE — Telephone Encounter (Signed)
Please advise on alternate medication.

## 2016-08-19 NOTE — Telephone Encounter (Signed)
[facility]

## 2016-08-19 NOTE — Telephone Encounter (Signed)
Pt calling stating that the insurance company never received labs and [id number]([phone]- (F)  Reference#:   [id number])  Pt state that he has been off of this medication for over 30 days and feel very letharagic and really need to have it today.  Pt has also tried both the pump and gel.

## 2016-08-20 NOTE — Telephone Encounter (Signed)
Has he tried the 1.62% pump spray?

## 2016-08-21 NOTE — Telephone Encounter (Signed)
The denial still remains, patient has to try another alternative.

## 2016-08-24 MED ORDER — TESTOSTERONE 20.25 MG/ACT (1.62%) TD GEL: TRANSDERMAL | 5 refills | Status: DC

## 2016-08-24 NOTE — Telephone Encounter (Signed)
Medication sent in for patient and order entered.

## 2016-08-24 NOTE — Telephone Encounter (Signed)
Pt has not tried the Androgel 1.62 percent due to insurance not wanting to cover when he previously tried. He is willing to try this. Please advise on script.

## 2016-08-24 NOTE — Addendum Note (Signed)
Addended by: Tempie HoistMCNEIL, Konor Noren M on: 08/24/2016 05:03 PM   Modules accepted: Orders

## 2016-08-24 NOTE — Telephone Encounter (Signed)
Start Androgel 1.62% pump spray- ONE pump spray per arm once daily and repeat total testosterone level in about one month.

## 2016-08-26 ENCOUNTER — Telehealth: Payer: Self-pay | Admitting: Family Medicine

## 2016-08-26 NOTE — Telephone Encounter (Signed)
Yes s

## 2016-08-26 NOTE — Telephone Encounter (Signed)
Pt states now his insurance company is requiring 2 testosterone levels to be done on 2 separate days. They will not approve the alternate testosterone med because it has been over 30 days since the first request. I have scheduled pt for tomorrow, Thurs, for the first testosterone lab.  Is it ok to do the second one on Friday?

## 2016-08-27 ENCOUNTER — Other Ambulatory Visit (INDEPENDENT_AMBULATORY_CARE_PROVIDER_SITE_OTHER): Payer: BLUE CROSS/BLUE SHIELD

## 2016-08-27 DIAGNOSIS — E349 Endocrine disorder, unspecified: Secondary | ICD-10-CM

## 2016-08-27 DIAGNOSIS — R972 Elevated prostate specific antigen [PSA]: Secondary | ICD-10-CM

## 2016-08-27 DIAGNOSIS — R7989 Other specified abnormal findings of blood chemistry: Secondary | ICD-10-CM

## 2016-08-28 ENCOUNTER — Other Ambulatory Visit: Payer: BLUE CROSS/BLUE SHIELD

## 2016-08-28 DIAGNOSIS — J301 Allergic rhinitis due to pollen: Secondary | ICD-10-CM | POA: Diagnosis not present

## 2016-08-28 DIAGNOSIS — J3089 Other allergic rhinitis: Secondary | ICD-10-CM | POA: Diagnosis not present

## 2016-08-28 DIAGNOSIS — J3081 Allergic rhinitis due to animal (cat) (dog) hair and dander: Secondary | ICD-10-CM | POA: Diagnosis not present

## 2016-08-28 LAB — TESTOSTERONE: TESTOSTERONE: 167.08 ng/dL — AB (ref 300.00–890.00)

## 2016-08-28 LAB — PSA: PSA: 0.54 ng/mL (ref 0.10–4.00)

## 2016-08-28 NOTE — Telephone Encounter (Signed)
Pt was scheduled

## 2016-08-31 ENCOUNTER — Encounter: Payer: Self-pay | Admitting: Family Medicine

## 2016-08-31 ENCOUNTER — Other Ambulatory Visit (INDEPENDENT_AMBULATORY_CARE_PROVIDER_SITE_OTHER): Payer: BLUE CROSS/BLUE SHIELD

## 2016-08-31 DIAGNOSIS — E349 Endocrine disorder, unspecified: Secondary | ICD-10-CM

## 2016-08-31 LAB — TESTOSTERONE: Testosterone: 146.52 ng/dL — ABNORMAL LOW (ref 300.00–890.00)

## 2016-08-31 NOTE — Telephone Encounter (Signed)
PA started for new testosterone medication.    Key: [id number]

## 2016-09-01 NOTE — Telephone Encounter (Signed)
PA approved, form faxed back to pharmacy. 

## 2016-09-11 DIAGNOSIS — J301 Allergic rhinitis due to pollen: Secondary | ICD-10-CM | POA: Diagnosis not present

## 2016-09-11 DIAGNOSIS — J3081 Allergic rhinitis due to animal (cat) (dog) hair and dander: Secondary | ICD-10-CM | POA: Diagnosis not present

## 2016-09-11 DIAGNOSIS — J3089 Other allergic rhinitis: Secondary | ICD-10-CM | POA: Diagnosis not present

## 2016-09-16 ENCOUNTER — Encounter: Payer: Self-pay | Admitting: Family Medicine

## 2016-09-22 DIAGNOSIS — J3089 Other allergic rhinitis: Secondary | ICD-10-CM | POA: Diagnosis not present

## 2016-09-22 DIAGNOSIS — J301 Allergic rhinitis due to pollen: Secondary | ICD-10-CM | POA: Diagnosis not present

## 2016-09-22 DIAGNOSIS — J3081 Allergic rhinitis due to animal (cat) (dog) hair and dander: Secondary | ICD-10-CM | POA: Diagnosis not present

## 2016-09-29 DIAGNOSIS — J3081 Allergic rhinitis due to animal (cat) (dog) hair and dander: Secondary | ICD-10-CM | POA: Diagnosis not present

## 2016-09-29 DIAGNOSIS — J3089 Other allergic rhinitis: Secondary | ICD-10-CM | POA: Diagnosis not present

## 2016-09-29 DIAGNOSIS — J301 Allergic rhinitis due to pollen: Secondary | ICD-10-CM | POA: Diagnosis not present

## 2016-10-01 DIAGNOSIS — J301 Allergic rhinitis due to pollen: Secondary | ICD-10-CM | POA: Diagnosis not present

## 2016-10-02 DIAGNOSIS — J3089 Other allergic rhinitis: Secondary | ICD-10-CM | POA: Diagnosis not present

## 2016-10-27 ENCOUNTER — Other Ambulatory Visit (INDEPENDENT_AMBULATORY_CARE_PROVIDER_SITE_OTHER): Payer: BLUE CROSS/BLUE SHIELD

## 2016-10-27 ENCOUNTER — Encounter: Payer: Self-pay | Admitting: Family Medicine

## 2016-10-27 DIAGNOSIS — E349 Endocrine disorder, unspecified: Secondary | ICD-10-CM | POA: Diagnosis not present

## 2016-10-27 LAB — TESTOSTERONE: TESTOSTERONE: 225.73 ng/dL — AB (ref 300.00–890.00)

## 2016-10-28 DIAGNOSIS — J3089 Other allergic rhinitis: Secondary | ICD-10-CM | POA: Diagnosis not present

## 2016-10-28 DIAGNOSIS — J3081 Allergic rhinitis due to animal (cat) (dog) hair and dander: Secondary | ICD-10-CM | POA: Diagnosis not present

## 2016-10-28 DIAGNOSIS — J301 Allergic rhinitis due to pollen: Secondary | ICD-10-CM | POA: Diagnosis not present

## 2016-10-30 ENCOUNTER — Other Ambulatory Visit: Payer: Self-pay | Admitting: Family Medicine

## 2016-10-30 ENCOUNTER — Other Ambulatory Visit: Payer: Self-pay | Admitting: *Deleted

## 2016-10-30 DIAGNOSIS — E349 Endocrine disorder, unspecified: Secondary | ICD-10-CM

## 2016-10-30 MED ORDER — TESTOSTERONE 20.25 MG/ACT (1.62%) TD GEL: TRANSDERMAL | 5 refills | Status: DC

## 2016-11-17 DIAGNOSIS — J301 Allergic rhinitis due to pollen: Secondary | ICD-10-CM | POA: Diagnosis not present

## 2016-11-17 DIAGNOSIS — J3089 Other allergic rhinitis: Secondary | ICD-10-CM | POA: Diagnosis not present

## 2016-11-17 DIAGNOSIS — J3081 Allergic rhinitis due to animal (cat) (dog) hair and dander: Secondary | ICD-10-CM | POA: Diagnosis not present

## 2016-11-30 DIAGNOSIS — J301 Allergic rhinitis due to pollen: Secondary | ICD-10-CM | POA: Diagnosis not present

## 2016-11-30 DIAGNOSIS — J3089 Other allergic rhinitis: Secondary | ICD-10-CM | POA: Diagnosis not present

## 2016-11-30 DIAGNOSIS — J3081 Allergic rhinitis due to animal (cat) (dog) hair and dander: Secondary | ICD-10-CM | POA: Diagnosis not present

## 2016-12-15 DIAGNOSIS — J3081 Allergic rhinitis due to animal (cat) (dog) hair and dander: Secondary | ICD-10-CM | POA: Diagnosis not present

## 2016-12-15 DIAGNOSIS — J301 Allergic rhinitis due to pollen: Secondary | ICD-10-CM | POA: Diagnosis not present

## 2016-12-15 DIAGNOSIS — J3089 Other allergic rhinitis: Secondary | ICD-10-CM | POA: Diagnosis not present

## 2016-12-18 DIAGNOSIS — J301 Allergic rhinitis due to pollen: Secondary | ICD-10-CM | POA: Diagnosis not present

## 2016-12-18 DIAGNOSIS — J3089 Other allergic rhinitis: Secondary | ICD-10-CM | POA: Diagnosis not present

## 2016-12-18 DIAGNOSIS — J3081 Allergic rhinitis due to animal (cat) (dog) hair and dander: Secondary | ICD-10-CM | POA: Diagnosis not present

## 2016-12-21 DIAGNOSIS — J3089 Other allergic rhinitis: Secondary | ICD-10-CM | POA: Diagnosis not present

## 2016-12-21 DIAGNOSIS — J3081 Allergic rhinitis due to animal (cat) (dog) hair and dander: Secondary | ICD-10-CM | POA: Diagnosis not present

## 2016-12-21 DIAGNOSIS — J301 Allergic rhinitis due to pollen: Secondary | ICD-10-CM | POA: Diagnosis not present

## 2016-12-24 DIAGNOSIS — J301 Allergic rhinitis due to pollen: Secondary | ICD-10-CM | POA: Diagnosis not present

## 2016-12-24 DIAGNOSIS — J3081 Allergic rhinitis due to animal (cat) (dog) hair and dander: Secondary | ICD-10-CM | POA: Diagnosis not present

## 2016-12-24 DIAGNOSIS — J3089 Other allergic rhinitis: Secondary | ICD-10-CM | POA: Diagnosis not present

## 2016-12-26 ENCOUNTER — Encounter: Payer: Self-pay | Admitting: Family Medicine

## 2016-12-28 DIAGNOSIS — J301 Allergic rhinitis due to pollen: Secondary | ICD-10-CM | POA: Diagnosis not present

## 2016-12-28 DIAGNOSIS — J3081 Allergic rhinitis due to animal (cat) (dog) hair and dander: Secondary | ICD-10-CM | POA: Diagnosis not present

## 2016-12-28 DIAGNOSIS — J3089 Other allergic rhinitis: Secondary | ICD-10-CM | POA: Diagnosis not present

## 2016-12-28 MED ORDER — SILDENAFIL CITRATE 50 MG PO TABS
ORAL_TABLET | ORAL | 5 refills | Status: DC
Start: 2016-12-28 — End: 2017-07-26

## 2017-01-05 ENCOUNTER — Other Ambulatory Visit (INDEPENDENT_AMBULATORY_CARE_PROVIDER_SITE_OTHER): Payer: BLUE CROSS/BLUE SHIELD

## 2017-01-05 DIAGNOSIS — E349 Endocrine disorder, unspecified: Secondary | ICD-10-CM

## 2017-01-06 DIAGNOSIS — J301 Allergic rhinitis due to pollen: Secondary | ICD-10-CM | POA: Diagnosis not present

## 2017-01-06 DIAGNOSIS — J3081 Allergic rhinitis due to animal (cat) (dog) hair and dander: Secondary | ICD-10-CM | POA: Diagnosis not present

## 2017-01-06 DIAGNOSIS — J3089 Other allergic rhinitis: Secondary | ICD-10-CM | POA: Diagnosis not present

## 2017-01-06 LAB — CP2130 TESTOSTERONE, TOTAL AND FREE
SEX HORMONE BINDING: 32 nmol/L (ref 10–50)
TESTOSTERONE: 104 ng/dL — AB (ref 300–890)
Testosterone, Free: 19.5 pg/mL — ABNORMAL LOW (ref 47.0–244.0)
Testosterone-% Free: 1.9 % (ref 1.6–2.9)

## 2017-01-11 ENCOUNTER — Encounter: Payer: Self-pay | Admitting: Family Medicine

## 2017-01-13 ENCOUNTER — Telehealth: Payer: Self-pay

## 2017-01-13 MED ORDER — TESTOSTERONE CYPIONATE 200 MG/ML IM SOLN: 200.0000 mg | INTRAMUSCULAR | 5 refills | Status: DC

## 2017-01-13 NOTE — Telephone Encounter (Signed)
Received PA request for Testosterone Cyp. PA submitted & pending. Key: [id number]

## 2017-01-15 ENCOUNTER — Ambulatory Visit (INDEPENDENT_AMBULATORY_CARE_PROVIDER_SITE_OTHER): Payer: BLUE CROSS/BLUE SHIELD

## 2017-01-15 DIAGNOSIS — J3089 Other allergic rhinitis: Secondary | ICD-10-CM | POA: Diagnosis not present

## 2017-01-15 DIAGNOSIS — J3081 Allergic rhinitis due to animal (cat) (dog) hair and dander: Secondary | ICD-10-CM | POA: Diagnosis not present

## 2017-01-15 DIAGNOSIS — R7989 Other specified abnormal findings of blood chemistry: Secondary | ICD-10-CM | POA: Diagnosis not present

## 2017-01-15 DIAGNOSIS — J301 Allergic rhinitis due to pollen: Secondary | ICD-10-CM | POA: Diagnosis not present

## 2017-01-15 MED ORDER — TESTOSTERONE CYPIONATE 200 MG/ML IM SOLN
200.0000 mg | INTRAMUSCULAR | Status: DC
  Administered 2017-01-15 – 2017-02-26 (×2): 200 mg via INTRAMUSCULAR

## 2017-01-15 NOTE — Telephone Encounter (Signed)
PA approved, form faxed back to pharmacy. 

## 2017-01-28 ENCOUNTER — Ambulatory Visit (INDEPENDENT_AMBULATORY_CARE_PROVIDER_SITE_OTHER): Payer: BLUE CROSS/BLUE SHIELD

## 2017-01-28 DIAGNOSIS — J3081 Allergic rhinitis due to animal (cat) (dog) hair and dander: Secondary | ICD-10-CM | POA: Diagnosis not present

## 2017-01-28 DIAGNOSIS — R7989 Other specified abnormal findings of blood chemistry: Secondary | ICD-10-CM

## 2017-01-28 DIAGNOSIS — J301 Allergic rhinitis due to pollen: Secondary | ICD-10-CM | POA: Diagnosis not present

## 2017-01-28 DIAGNOSIS — J3089 Other allergic rhinitis: Secondary | ICD-10-CM | POA: Diagnosis not present

## 2017-01-28 MED ORDER — TESTOSTERONE CYPIONATE 200 MG/ML IM SOLN
200.0000 mg | INTRAMUSCULAR | Status: DC
  Administered 2017-01-28: 200 mg via INTRAMUSCULAR

## 2017-01-29 DIAGNOSIS — J3089 Other allergic rhinitis: Secondary | ICD-10-CM | POA: Diagnosis not present

## 2017-01-29 DIAGNOSIS — J3081 Allergic rhinitis due to animal (cat) (dog) hair and dander: Secondary | ICD-10-CM | POA: Diagnosis not present

## 2017-01-29 DIAGNOSIS — J301 Allergic rhinitis due to pollen: Secondary | ICD-10-CM | POA: Diagnosis not present

## 2017-02-02 ENCOUNTER — Other Ambulatory Visit: Payer: Self-pay | Admitting: Family Medicine

## 2017-02-02 NOTE — Telephone Encounter (Signed)
No recent OV's related to sleep disturbance Last refill Zopidem 07/15/16, #30, 5 refills  Please advise. Thanks!

## 2017-02-02 NOTE — Telephone Encounter (Signed)
Refill once 

## 2017-02-10 ENCOUNTER — Ambulatory Visit (INDEPENDENT_AMBULATORY_CARE_PROVIDER_SITE_OTHER): Payer: BLUE CROSS/BLUE SHIELD

## 2017-02-10 DIAGNOSIS — R7989 Other specified abnormal findings of blood chemistry: Secondary | ICD-10-CM

## 2017-02-10 DIAGNOSIS — J3089 Other allergic rhinitis: Secondary | ICD-10-CM | POA: Diagnosis not present

## 2017-02-10 DIAGNOSIS — J301 Allergic rhinitis due to pollen: Secondary | ICD-10-CM | POA: Diagnosis not present

## 2017-02-10 DIAGNOSIS — J3081 Allergic rhinitis due to animal (cat) (dog) hair and dander: Secondary | ICD-10-CM | POA: Diagnosis not present

## 2017-02-10 NOTE — Progress Notes (Signed)
After obtaining consent, and per orders of Dr. Caryl NeverBUrchette, injection of Testosterone given by Armando GangSheena H Cox. Patient instructed to remain in clinic for 20 minutes afterwards, and to report any adverse reaction to me immediately.

## 2017-02-26 ENCOUNTER — Ambulatory Visit (INDEPENDENT_AMBULATORY_CARE_PROVIDER_SITE_OTHER): Payer: BLUE CROSS/BLUE SHIELD

## 2017-02-26 DIAGNOSIS — R7989 Other specified abnormal findings of blood chemistry: Secondary | ICD-10-CM

## 2017-02-26 DIAGNOSIS — J3089 Other allergic rhinitis: Secondary | ICD-10-CM | POA: Diagnosis not present

## 2017-02-26 DIAGNOSIS — J301 Allergic rhinitis due to pollen: Secondary | ICD-10-CM | POA: Diagnosis not present

## 2017-02-26 DIAGNOSIS — J3081 Allergic rhinitis due to animal (cat) (dog) hair and dander: Secondary | ICD-10-CM | POA: Diagnosis not present

## 2017-02-26 MED ORDER — TESTOSTERONE CYPIONATE 200 MG/ML IM SOLN: 200.0000 mg | INTRAMUSCULAR | 5 refills | Status: DC

## 2017-02-26 NOTE — Progress Notes (Signed)
Pt came for his Testerone injection. Pt tolerated the injection well.

## 2017-03-06 ENCOUNTER — Encounter: Payer: Self-pay | Admitting: Family Medicine

## 2017-03-08 ENCOUNTER — Other Ambulatory Visit: Payer: Self-pay | Admitting: Family Medicine

## 2017-03-08 DIAGNOSIS — E349 Endocrine disorder, unspecified: Secondary | ICD-10-CM

## 2017-03-09 ENCOUNTER — Other Ambulatory Visit: Payer: Self-pay | Admitting: Family Medicine

## 2017-03-10 NOTE — Telephone Encounter (Signed)
Refill OK

## 2017-03-10 NOTE — Telephone Encounter (Signed)
Last refill 02/03/17 and last office visit 06/17/16.  Okay to fill?

## 2017-03-12 ENCOUNTER — Ambulatory Visit (INDEPENDENT_AMBULATORY_CARE_PROVIDER_SITE_OTHER): Payer: BLUE CROSS/BLUE SHIELD | Admitting: *Deleted

## 2017-03-12 DIAGNOSIS — R7989 Other specified abnormal findings of blood chemistry: Secondary | ICD-10-CM

## 2017-03-12 DIAGNOSIS — J301 Allergic rhinitis due to pollen: Secondary | ICD-10-CM | POA: Diagnosis not present

## 2017-03-12 DIAGNOSIS — J3089 Other allergic rhinitis: Secondary | ICD-10-CM | POA: Diagnosis not present

## 2017-03-12 DIAGNOSIS — J3081 Allergic rhinitis due to animal (cat) (dog) hair and dander: Secondary | ICD-10-CM | POA: Diagnosis not present

## 2017-03-12 MED ORDER — TESTOSTERONE CYPIONATE 200 MG/ML IM SOLN
200.0000 mg | Freq: Once | INTRAMUSCULAR | Status: AC
  Administered 2017-03-12: 200 mg via INTRAMUSCULAR

## 2017-03-12 NOTE — Progress Notes (Signed)
Patient here for testosterone injection. Patient tolerated injection well.  Starla Link, RN

## 2017-03-26 ENCOUNTER — Ambulatory Visit (INDEPENDENT_AMBULATORY_CARE_PROVIDER_SITE_OTHER): Payer: BLUE CROSS/BLUE SHIELD

## 2017-03-26 DIAGNOSIS — J3081 Allergic rhinitis due to animal (cat) (dog) hair and dander: Secondary | ICD-10-CM | POA: Diagnosis not present

## 2017-03-26 DIAGNOSIS — R7989 Other specified abnormal findings of blood chemistry: Secondary | ICD-10-CM | POA: Diagnosis not present

## 2017-03-26 DIAGNOSIS — J3089 Other allergic rhinitis: Secondary | ICD-10-CM | POA: Diagnosis not present

## 2017-03-26 DIAGNOSIS — J301 Allergic rhinitis due to pollen: Secondary | ICD-10-CM | POA: Diagnosis not present

## 2017-03-26 MED ORDER — TESTOSTERONE CYPIONATE 200 MG/ML IM SOLN
200.0000 mg | INTRAMUSCULAR | Status: DC
  Administered 2017-03-26: 200 mg via INTRAMUSCULAR

## 2017-04-08 ENCOUNTER — Encounter: Payer: Self-pay | Admitting: Family Medicine

## 2017-04-09 ENCOUNTER — Ambulatory Visit (INDEPENDENT_AMBULATORY_CARE_PROVIDER_SITE_OTHER): Payer: BLUE CROSS/BLUE SHIELD

## 2017-04-09 ENCOUNTER — Encounter: Payer: Self-pay | Admitting: Family Medicine

## 2017-04-09 ENCOUNTER — Other Ambulatory Visit (INDEPENDENT_AMBULATORY_CARE_PROVIDER_SITE_OTHER): Payer: BLUE CROSS/BLUE SHIELD

## 2017-04-09 DIAGNOSIS — E349 Endocrine disorder, unspecified: Secondary | ICD-10-CM

## 2017-04-09 DIAGNOSIS — J3081 Allergic rhinitis due to animal (cat) (dog) hair and dander: Secondary | ICD-10-CM | POA: Diagnosis not present

## 2017-04-09 DIAGNOSIS — J301 Allergic rhinitis due to pollen: Secondary | ICD-10-CM | POA: Diagnosis not present

## 2017-04-09 DIAGNOSIS — R7989 Other specified abnormal findings of blood chemistry: Secondary | ICD-10-CM | POA: Diagnosis not present

## 2017-04-09 DIAGNOSIS — J3089 Other allergic rhinitis: Secondary | ICD-10-CM | POA: Diagnosis not present

## 2017-04-09 LAB — TESTOSTERONE: TESTOSTERONE: 349.55 ng/dL (ref 300.00–890.00)

## 2017-04-09 MED ORDER — TESTOSTERONE CYPIONATE 200 MG/ML IM SOLN
200.0000 mg | INTRAMUSCULAR | Status: DC
  Administered 2017-04-09: 200 mg via INTRAMUSCULAR

## 2017-04-11 ENCOUNTER — Encounter: Payer: Self-pay | Admitting: Family Medicine

## 2017-04-13 ENCOUNTER — Encounter: Payer: Self-pay | Admitting: Family Medicine

## 2017-04-14 ENCOUNTER — Other Ambulatory Visit: Payer: Self-pay | Admitting: Family Medicine

## 2017-04-14 NOTE — Telephone Encounter (Signed)
Refills OK. 

## 2017-04-14 NOTE — Telephone Encounter (Signed)
Last refill 03/10/17 and last office visit 06/17/16.  Okay to fill?

## 2017-04-16 ENCOUNTER — Other Ambulatory Visit: Payer: Self-pay | Admitting: Family Medicine

## 2017-04-16 DIAGNOSIS — E349 Endocrine disorder, unspecified: Secondary | ICD-10-CM

## 2017-04-22 ENCOUNTER — Ambulatory Visit (INDEPENDENT_AMBULATORY_CARE_PROVIDER_SITE_OTHER): Payer: BLUE CROSS/BLUE SHIELD

## 2017-04-22 DIAGNOSIS — J301 Allergic rhinitis due to pollen: Secondary | ICD-10-CM | POA: Diagnosis not present

## 2017-04-22 DIAGNOSIS — J3081 Allergic rhinitis due to animal (cat) (dog) hair and dander: Secondary | ICD-10-CM | POA: Diagnosis not present

## 2017-04-22 DIAGNOSIS — R7989 Other specified abnormal findings of blood chemistry: Secondary | ICD-10-CM

## 2017-04-22 DIAGNOSIS — J3089 Other allergic rhinitis: Secondary | ICD-10-CM | POA: Diagnosis not present

## 2017-04-22 MED ORDER — TESTOSTERONE CYPIONATE 200 MG/ML IM SOLN
300.0000 mg | INTRAMUSCULAR | Status: DC
  Administered 2017-04-22: 300 mg via INTRAMUSCULAR

## 2017-04-22 NOTE — Progress Notes (Signed)
Pt was here this morning for his testosterone, per dr Caryl Never last visit note to increase Testosterone to 300 mg next two weeks until the repeat in 2 months Pt received 1.48mL which is 300 mg.

## 2017-04-23 DIAGNOSIS — L738 Other specified follicular disorders: Secondary | ICD-10-CM | POA: Diagnosis not present

## 2017-04-29 ENCOUNTER — Encounter: Payer: Self-pay | Admitting: Podiatry

## 2017-04-29 ENCOUNTER — Ambulatory Visit (INDEPENDENT_AMBULATORY_CARE_PROVIDER_SITE_OTHER): Payer: BLUE CROSS/BLUE SHIELD | Admitting: Podiatry

## 2017-04-29 DIAGNOSIS — L600 Ingrowing nail: Secondary | ICD-10-CM

## 2017-04-29 DIAGNOSIS — M779 Enthesopathy, unspecified: Secondary | ICD-10-CM

## 2017-04-29 NOTE — Patient Instructions (Signed)

## 2017-04-30 ENCOUNTER — Telehealth: Payer: Self-pay | Admitting: *Deleted

## 2017-04-30 NOTE — Telephone Encounter (Signed)
Called and talked to patient and the patient stated that the toenails are doing fine and the inserts that I have are worn and would like to get a new pair. Misty Stanley

## 2017-04-30 NOTE — Progress Notes (Signed)
Subjective:    Patient ID: Kenneth Knapp, male   DOB: 56 y.o.   MRN: [id number]   HPI Kenneth Knapp Presents the office today for concerns ingrown toenails both big toes. He states they have been removed several years ago he began small piece of the nail that reoccurred on the corners become painful. He tries to cut them out himself but they come back quickly causing pain. Denies any redness or drainage or any swelling. He'll at the have the procedure redone to remove the corners of the nails today.  He also requesting new orthotics today. Currently he denies any foot pain however his orthotics are worn out to go ahead and get new ones. He states that if he does not wear his inserts is "bones start to hurt".    Review of Systems  All other systems reviewed and are negative.  Past Medical History:  Diagnosis Date  . ALLERGIC RHINITIS 08/03/2007  . BENIGN PROSTATIC HYPERTROPHY 08/03/2007  . Chronic low back pain   . HYPERLIPIDEMIA 08/03/2007  . TESTICULAR HYPOFUNCTION 12/04/2009    Past Surgical History:  Procedure Laterality Date  . BACK SURGERY  2002  . WISDOM TOOTH EXTRACTION       Current Outpatient Prescriptions:  .  cyclobenzaprine (FLEXERIL) 10 MG tablet, Take 1 tablet (10 mg total) by mouth at bedtime., Disp: 30 tablet, Rfl: 0 .  diclofenac (VOLTAREN) 75 MG EC tablet, TAKE ONE TABLET BY MOUTH TWICE DAILY, Disp: 180 tablet, Rfl: 1 .  EPINEPHrine 0.3 mg/0.3 mL IJ SOAJ injection, Use as needed, Disp: , Rfl:  .  fexofenadine (ALLEGRA) 180 MG tablet, Take 180 mg by mouth daily.  , Disp: , Rfl:  .  lovastatin (MEVACOR) 20 MG tablet, TAKE ONE TABLET BY MOUTH AT BEDTIME, Disp: 90 tablet, Rfl: 3 .  Multiple Vitamin (MULTIVITAMIN) tablet, Take 1 tablet by mouth daily., Disp: , Rfl:  .  propranolol (INDERAL) 10 MG tablet, Take one tablet about one hour prior to stage performance., Disp: 30 tablet, Rfl: 3 .  sildenafil (VIAGRA) 50 MG tablet, take one hour prior to sexual activity, Disp:  6 tablet, Rfl: 5 .  tadalafil (CIALIS) 20 MG tablet, Take 0.5-1 tablets (10-20 mg total) by mouth every other day as needed for erectile dysfunction., Disp: 6 tablet, Rfl: 11 .  Testosterone (ANDROGEL PUMP) 20.25 MG/ACT (1.62%) GEL, Apply 2 pump spray per arm once daily., Disp: 75 g, Rfl: 5 .  zolpidem (AMBIEN) 5 MG tablet, TAKE ONE TABLET BY MOUTH AT BEDTIME, Disp: 30 tablet, Rfl: 5  Current Facility-Administered Medications:  .  testosterone cypionate (DEPOTESTOSTERONE CYPIONATE) injection 200 mg, 200 mg, Intramuscular, Q14 Days, Burchette, Elberta Fortis, MD, 200 mg at 03/26/17 0846 .  testosterone cypionate (DEPOTESTOSTERONE CYPIONATE) injection 200 mg, 200 mg, Intramuscular, Q14 Days, Burchette, Elberta Fortis, MD, 200 mg at 04/09/17 0828 .  testosterone cypionate (DEPOTESTOSTERONE CYPIONATE) injection 300 mg, 300 mg, Intramuscular, Q14 Days, Burchette, Elberta Fortis, MD, 300 mg at 04/22/17 0818  No Known Allergies  Social History   Social History  . Marital status: Married    Spouse name: N/A  . Number of children: N/A  . Years of education: N/A   Occupational History  . Not on file.   Social History Main Topics  . Smoking status: Never Smoker  . Smokeless tobacco: Never Used  . Alcohol use No  . Drug use: No  . Sexual activity: Not on file   Other Topics Concern  . Not on file  Social History Narrative  . No narrative on file        Objective:  Physical Exam General: AAO x3, NAD  Dermatological: Small spicules of nails are present within both the medial and lateral aspects of bilateral hallux toenails nurse tenderness to palpation of this area. There is no overlying edema, erythema, increase in warmth. There is no drainage or pus. No open lesions or pre-ulcerative lesions are identified otherwise.  Vascular: Dorsalis Pedis artery and Posterior Tibial artery pedal pulses are 2/4 bilateral with immedate capillary fill time. There is no pain with calf compression, swelling, warmth,  erythema.   Neruologic: Grossly intact via light touch bilateral.  Protective threshold with Semmes Wienstein monofilament intact to all pedal sites bilateral.   Musculoskeletal: Ankle, subtalar, midtarsal range of motion intact. There is no area pinpoint bony tenderness or pain the vibratory sensation. No gross boney pedal deformities bilateral. No pain, crepitus, or limitation noted with foot and ankle range of motion bilateral. Muscular strength 5/5 in all groups tested bilateral.  Gait: Unassisted, Nonantalgic.      Assessment:     Bilateral hallux recurrent ingrown toenails; requesting new orthotics for tendonitis/capsulitis     Plan:     -Treatment options discussed including all alternatives, risks, and complications -Etiology of symptoms were discussed -At this time, the patient is requesting partial nail removal with chemical matricectomy to the symptomatic portion of the nail. Risks and complications were discussed with the patient for which they understand and written consent was obtained. Under sterile conditions a total of 3 mL of a mixture of 2% lidocaine plain and 0.5% Marcaine plain was infiltrated in a hallux block fashion. Once anesthetized, the skin was prepped in sterile fashion. A tourniquet was then applied. Next the medial and lateral aspect of hallux nail bordesr was then sharply excised making sure to remove the entire offending nail border. Once the nails were ensured to be removed area was debrided and the underlying skin was intact. There is no purulence identified in the procedure. Next phenol was then applied under standard conditions and copiously irrigated. Silvadene was applied. A dry sterile dressing was applied. After application of the dressing the tourniquet was removed and there is found to be an immediate capillary refill time to the digit. The patient tolerated the procedure well any complications. Post procedure instructions were discussed the patient for  which he verbally understood. Follow-up in one week for nail check or sooner if any problems are to arise. Discussed signs/symptoms of infection and directed to call the office immediately should any occur or go directly to the emergency room. In the meantime, encouraged to call the office with any questions, concerns, changes symptoms. -He was molded for new orthotics today. They were sent to [facility] as this is where he had them before and did well.   Ovid Curd, DPM

## 2017-05-07 ENCOUNTER — Ambulatory Visit: Payer: BLUE CROSS/BLUE SHIELD | Admitting: Podiatry

## 2017-05-07 ENCOUNTER — Ambulatory Visit (INDEPENDENT_AMBULATORY_CARE_PROVIDER_SITE_OTHER): Payer: BLUE CROSS/BLUE SHIELD

## 2017-05-07 DIAGNOSIS — J3081 Allergic rhinitis due to animal (cat) (dog) hair and dander: Secondary | ICD-10-CM | POA: Diagnosis not present

## 2017-05-07 DIAGNOSIS — R7989 Other specified abnormal findings of blood chemistry: Secondary | ICD-10-CM

## 2017-05-07 DIAGNOSIS — J3089 Other allergic rhinitis: Secondary | ICD-10-CM | POA: Diagnosis not present

## 2017-05-07 DIAGNOSIS — J301 Allergic rhinitis due to pollen: Secondary | ICD-10-CM | POA: Diagnosis not present

## 2017-05-07 MED ORDER — TESTOSTERONE CYPIONATE 200 MG/ML IM SOLN
300.0000 mg | Freq: Once | INTRAMUSCULAR | Status: AC
  Administered 2017-05-07: 300 mg via INTRAMUSCULAR

## 2017-05-12 NOTE — Progress Notes (Addendum)
Agree with testosterone injection as per Amparo BristolPaty Jaimes, LPN  Kristian CoveyBruce W Burchette MD [facility]

## 2017-05-21 ENCOUNTER — Ambulatory Visit (INDEPENDENT_AMBULATORY_CARE_PROVIDER_SITE_OTHER): Payer: BLUE CROSS/BLUE SHIELD

## 2017-05-21 ENCOUNTER — Other Ambulatory Visit (INDEPENDENT_AMBULATORY_CARE_PROVIDER_SITE_OTHER): Payer: BLUE CROSS/BLUE SHIELD

## 2017-05-21 DIAGNOSIS — R7989 Other specified abnormal findings of blood chemistry: Secondary | ICD-10-CM

## 2017-05-21 DIAGNOSIS — J301 Allergic rhinitis due to pollen: Secondary | ICD-10-CM | POA: Diagnosis not present

## 2017-05-21 DIAGNOSIS — E349 Endocrine disorder, unspecified: Secondary | ICD-10-CM

## 2017-05-21 DIAGNOSIS — J3089 Other allergic rhinitis: Secondary | ICD-10-CM | POA: Diagnosis not present

## 2017-05-21 DIAGNOSIS — J3081 Allergic rhinitis due to animal (cat) (dog) hair and dander: Secondary | ICD-10-CM | POA: Diagnosis not present

## 2017-05-21 LAB — TESTOSTERONE: Testosterone: 592.15 ng/dL (ref 300.00–890.00)

## 2017-05-21 MED ORDER — TESTOSTERONE CYPIONATE 200 MG/ML IM SOLN
300.0000 mg | Freq: Once | INTRAMUSCULAR | Status: AC
  Administered 2017-05-21: 300 mg via INTRAMUSCULAR

## 2017-05-23 ENCOUNTER — Encounter: Payer: Self-pay | Admitting: Family Medicine

## 2017-05-24 MED ORDER — TESTOSTERONE CYPIONATE 200 MG/ML IM SOLN: INTRAMUSCULAR | 0 refills | Status: DC

## 2017-05-24 NOTE — Telephone Encounter (Signed)
New Rx called in.  Med list updated.

## 2017-06-01 ENCOUNTER — Ambulatory Visit: Payer: Self-pay | Admitting: Surgery

## 2017-06-01 DIAGNOSIS — L739 Follicular disorder, unspecified: Secondary | ICD-10-CM | POA: Diagnosis not present

## 2017-06-01 DIAGNOSIS — L729 Follicular cyst of the skin and subcutaneous tissue, unspecified: Secondary | ICD-10-CM | POA: Diagnosis not present

## 2017-06-01 NOTE — H&P (View-Only) (Signed)
Virgel A Dillenburg 06/01/2017 1:43 PM Location: [place] Office Patient #: [id number] DOB: 12/11/1960 Married / Language: English / Race: White Male  History of Present Illness (Zacariah Belue A. Tannis Burstein MD; 06/01/2017 2:05 PM) Patient words: Pt presents at the request of Dr Jones for a cyst on his medial left buttock. The has been treated with 2 rounds of of antibiotics and keeps recurring. He has no fever or chills but once the antobiotics are stopped ot return. No drainage and some tenderness noted.  The patient is a 56 year old male.   Diagnostic Studies History (Michelle R. Brooks, CMA; 06/01/2017 1:44 PM) Colonoscopy 5-10 years ago  Allergies (Michelle R. Brooks, CMA; 06/01/2017 1:44 PM) No Known Drug Allergies 06/01/2017  Medication History (Michelle R. Brooks, CMA; 06/01/2017 1:45 PM) Doxycycline Hyclate (100MG Capsule, Oral) Active. Diclofenac Sodium (75MG Tablet DR, Oral) Active. Lovastatin (20MG Tablet, Oral) Active. Testosterone Cypionate (200MG/ML Solution, Intramuscular) Active. Zolpidem Tartrate (5MG Tablet, Oral) Active. Allegra (180MG Tablet, Oral) Active. Medications Reconciled  Social History (Michelle R. Brooks, CMA; 06/01/2017 1:44 PM) Alcohol use Occasional alcohol use. Caffeine use Tea. No drug use Tobacco use Never smoker.  Family History (Michelle R. Brooks, CMA; 06/01/2017 1:44 PM) Arthritis Mother. Heart Disease Father.  Other Problems (Michelle R. Brooks, CMA; 06/01/2017 1:44 PM) Hypercholesterolemia     Review of Systems (Michelle R. Brooks CMA; 06/01/2017 1:44 PM) General Not Present- Appetite Loss, Chills, Fatigue, Fever, Night Sweats, Weight Gain and Weight Loss. Skin Present- New Lesions. Not Present- Change in Wart/Mole, Dryness, Hives, Jaundice, Non-Healing Wounds, Rash and Ulcer. HEENT Not Present- Earache, Hearing Loss, Hoarseness, Nose Bleed, Oral Ulcers, Ringing in the Ears, Seasonal Allergies, Sinus Pain, Sore Throat,  Visual Disturbances, Wears glasses/contact lenses and Yellow Eyes. Respiratory Not Present- Bloody sputum, Chronic Cough, Difficulty Breathing, Snoring and Wheezing. Breast Not Present- Breast Mass, Breast Pain, Nipple Discharge and Skin Changes. Cardiovascular Not Present- Chest Pain, Difficulty Breathing Lying Down, Leg Cramps, Palpitations, Rapid Heart Rate, Shortness of Breath and Swelling of Extremities. Gastrointestinal Not Present- Abdominal Pain, Bloating, Bloody Stool, Change in Bowel Habits, Chronic diarrhea, Constipation, Difficulty Swallowing, Excessive gas, Gets full quickly at meals, Hemorrhoids, Indigestion, Nausea, Rectal Pain and Vomiting. Male Genitourinary Not Present- Blood in Urine, Change in Urinary Stream, Frequency, Impotence, Nocturia, Painful Urination, Urgency and Urine Leakage.  Vitals (Michelle R. Brooks CMA; 06/01/2017 1:43 PM) 06/01/2017 1:43 PM Height: 71in Pulse: 85 (Regular)  BP: 120/72 (Sitting, Left Arm, Standard)      Physical Exam (Otha Monical A. Chuckie Mccathern MD; 06/01/2017 2:11 PM)  General Mental Status-Alert. General Appearance-Consistent with stated age. Hydration-Well hydrated. Voice-Normal.  Head and Neck Head-normocephalic, atraumatic with no lesions or palpable masses. Trachea-midline. Thyroid - Did not examine.  Rectal Note: LEFT OF MIDLINE IN THE GLUTEAL FOLD IS A 3 CM X 3 CM CYST WITH INFLAMMATION ? FOLLICULITIS LESS LIKELY PILONDAL NOT FLUCTUANT   Neurologic Neurologic evaluation reveals -alert and oriented x 3 with no impairment of recent or remote memory. Mental Status-Normal.  Musculoskeletal Normal Exam - Left-Upper Extremity Strength Normal and Lower Extremity Strength Normal. Normal Exam - Right-Upper Extremity Strength Normal and Lower Extremity Strength Normal.    Assessment & Plan (Nova Evett A. Darrelyn Morro MD; 06/01/2017 2:10 PM)  CYST OF BUTTOCKS (L72.9) Impression: Most likely cyst with  folliculitis recurrent so excison recommended less likely pilonidal cyst  Discussed risk benefits and alternatives to surgery   Bleeding infection poor wound healing , wound care and recurrence  He agrees to proceed  Current Plans The anatomy   and the physiology was discussed. The pathophysiology and natural history of the disease was discussed. Options were discussed and recommendations were made. Technique, risks, benefits, & alternatives were discussed. Risks such as stroke, heart attack, bleeding, indection, death, and other risks discussed. Questions answered. The patient agrees to proceed. Pt Education - [facility] Free Text Education/Instructions: discussed with patient and provided information. FOLLICULITIS (L73.9) Impression: FINISH ANTIBIOTICS

## 2017-06-01 NOTE — H&P (Signed)
Kenneth Knapp 06/01/2017 1:43 PM Location: [place] Office Patient #: [id number] DOB: 03-10-1961 Married / Language: [place] / Race: White Male  History of Present Illness Kenneth Knapp(Kenneth Dake A. Nevada Mullett MD; 06/01/2017 2:05 PM) Patient words: Pt presents at the request of Dr Yetta BarreJones for a cyst on his medial left buttock. The has been treated with 2 rounds of of antibiotics and keeps recurring. He has no fever or chills but once the antobiotics are stopped ot return. No drainage and some tenderness noted.  The patient is a 56 year old male.   Diagnostic Studies History Kenneth Knapp(Kenneth Knapp, CMA; 06/01/2017 1:44 PM) Colonoscopy 5-10 years ago  Allergies Kenneth Knapp(Kenneth Knapp, CMA; 06/01/2017 1:44 PM) No Known Drug Allergies 06/01/2017  Medication History Kenneth Knapp(Kenneth Knapp, CMA; 06/01/2017 1:45 PM) Doxycycline Hyclate (100MG  Capsule, Oral) Active. Diclofenac Sodium (75MG  Tablet DR, Oral) Active. Lovastatin (20MG  Tablet, Oral) Active. Testosterone Cypionate (200MG /ML Solution, Intramuscular) Active. Zolpidem Tartrate (5MG  Tablet, Oral) Active. Allegra (180MG  Tablet, Oral) Active. Medications Reconciled  Social History Kenneth Knapp(Kenneth Knapp, CMA; 06/01/2017 1:44 PM) Alcohol use Occasional alcohol use. Caffeine use Tea. No drug use Tobacco use Never smoker.  Family History Kenneth Knapp(Kenneth R. Kenneth Knapp, CMA; 06/01/2017 1:44 PM) Arthritis Mother. Heart Disease Father.  Other Problems Kenneth Knapp(Kenneth R. Kenneth Knapp, CMA; 06/01/2017 1:44 PM) Hypercholesterolemia     Review of Systems Kenneth Knapp(Kenneth Knapp CMA; 06/01/2017 1:44 PM) General Not Present- Appetite Loss, Chills, Fatigue, Fever, Night Sweats, Weight Gain and Weight Loss. Skin Present- New Lesions. Not Present- Change in Wart/Mole, Dryness, Hives, Jaundice, Non-Healing Wounds, Rash and Ulcer. HEENT Not Present- Earache, Hearing Loss, Hoarseness, Nose Bleed, Oral Ulcers, Ringing in the Ears, Seasonal Allergies, Sinus Pain, Sore Throat,  Visual Disturbances, Wears glasses/contact lenses and Yellow Eyes. Respiratory Not Present- Bloody sputum, Chronic Cough, Difficulty Breathing, Snoring and Wheezing. Breast Not Present- Breast Mass, Breast Pain, Nipple Discharge and Skin Changes. Cardiovascular Not Present- Chest Pain, Difficulty Breathing Lying Down, Leg Cramps, Palpitations, Rapid Heart Rate, Shortness of Breath and Swelling of Extremities. Gastrointestinal Not Present- Abdominal Pain, Bloating, Bloody Stool, Change in Bowel Habits, Chronic diarrhea, Constipation, Difficulty Swallowing, Excessive gas, Gets full quickly at meals, Hemorrhoids, Indigestion, Nausea, Rectal Pain and Vomiting. Male Genitourinary Not Present- Blood in Urine, Change in Urinary Stream, Frequency, Impotence, Nocturia, Painful Urination, Urgency and Urine Leakage.  Vitals [employer](Kenneth Knapp CMA; 06/01/2017 1:43 PM) 06/01/2017 1:43 PM Height: 71in Pulse: 85 (Regular)  BP: 120/72 (Sitting, Left Arm, Standard)      Physical Exam (Kenneth Knapp A. Albirda Shiel MD; 06/01/2017 2:11 PM)  General Mental Status-Alert. General Appearance-Consistent with stated age. Hydration-Well hydrated. Voice-Normal.  Head and Neck Head-normocephalic, atraumatic with no lesions or palpable masses. Trachea-midline. Thyroid - Did not examine.  Rectal Note: LEFT OF MIDLINE IN THE GLUTEAL FOLD IS A 3 CM X 3 CM CYST WITH INFLAMMATION ? FOLLICULITIS LESS LIKELY PILONDAL NOT FLUCTUANT   Neurologic Neurologic evaluation reveals -alert and oriented x 3 with no impairment of recent or remote memory. Mental Status-Normal.  Musculoskeletal Normal Exam - Left-Upper Extremity Strength Normal and Lower Extremity Strength Normal. Normal Exam - Right-Upper Extremity Strength Normal and Lower Extremity Strength Normal.    Assessment & Plan (Kenneth Lazar A. Olivia Royse MD; 06/01/2017 2:10 PM)  CYST OF BUTTOCKS (L72.9) Impression: Most likely cyst with  folliculitis recurrent so excison recommended less likely pilonidal cyst  Discussed risk benefits and alternatives to surgery   Bleeding infection poor wound healing , wound care and recurrence  He agrees to proceed  Current Plans The anatomy  and the physiology was discussed. The pathophysiology and natural history of the disease was discussed. Options were discussed and recommendations were made. Technique, risks, benefits, & alternatives were discussed. Risks such as stroke, heart attack, bleeding, indection, death, and other risks discussed. Questions answered. The patient agrees to proceed. Pt Education - [facility] Free Text Education/Instructions: discussed with patient and provided information. FOLLICULITIS (L73.9) Impression: FINISH ANTIBIOTICS

## 2017-06-02 ENCOUNTER — Telehealth: Payer: Self-pay | Admitting: *Deleted

## 2017-06-02 ENCOUNTER — Ambulatory Visit (INDEPENDENT_AMBULATORY_CARE_PROVIDER_SITE_OTHER): Payer: BLUE CROSS/BLUE SHIELD | Admitting: *Deleted

## 2017-06-02 DIAGNOSIS — E349 Endocrine disorder, unspecified: Secondary | ICD-10-CM

## 2017-06-02 DIAGNOSIS — Z0000 Encounter for general adult medical examination without abnormal findings: Secondary | ICD-10-CM

## 2017-06-02 MED ORDER — TESTOSTERONE CYPIONATE 200 MG/ML IM SOLN
300.0000 mg | Freq: Once | INTRAMUSCULAR | Status: AC
  Administered 2017-06-02: 300 mg via INTRAMUSCULAR

## 2017-06-02 NOTE — Telephone Encounter (Signed)
PSA, Lipid, hepatic, CBC, TSH, BMP, total testosterone

## 2017-06-02 NOTE — Telephone Encounter (Signed)
Orders placed.

## 2017-06-02 NOTE — Telephone Encounter (Signed)
Patient was in the office today for his testosterone injections.  Patient will have labs done in 6 months.  Which labs would you liked ordered?

## 2017-06-04 ENCOUNTER — Other Ambulatory Visit: Payer: Self-pay

## 2017-06-04 ENCOUNTER — Encounter (HOSPITAL_BASED_OUTPATIENT_CLINIC_OR_DEPARTMENT_OTHER): Payer: Self-pay | Admitting: *Deleted

## 2017-06-07 NOTE — Progress Notes (Signed)
Ensure pre surgery drink given with instructions to complete by 0945 dos, pt verbalized understanding. 

## 2017-06-08 ENCOUNTER — Ambulatory Visit (HOSPITAL_BASED_OUTPATIENT_CLINIC_OR_DEPARTMENT_OTHER): Payer: BLUE CROSS/BLUE SHIELD | Admitting: Anesthesiology

## 2017-06-08 ENCOUNTER — Ambulatory Visit (HOSPITAL_BASED_OUTPATIENT_CLINIC_OR_DEPARTMENT_OTHER)
Admission: RE | Admit: 2017-06-08 | Discharge: 2017-06-08 | Disposition: A | Discharge status: Home / Self Care | Payer: BLUE CROSS/BLUE SHIELD | Source: Ambulatory Visit | Attending: Surgery | Admitting: Surgery

## 2017-06-08 ENCOUNTER — Encounter (HOSPITAL_BASED_OUTPATIENT_CLINIC_OR_DEPARTMENT_OTHER): Payer: Self-pay | Admitting: Anesthesiology

## 2017-06-08 ENCOUNTER — Encounter (HOSPITAL_BASED_OUTPATIENT_CLINIC_OR_DEPARTMENT_OTHER)
Admission: RE | Disposition: A | Discharge status: Home / Self Care | Payer: Self-pay | Source: Ambulatory Visit | Attending: Surgery

## 2017-06-08 DIAGNOSIS — E7800 Pure hypercholesterolemia, unspecified: Secondary | ICD-10-CM | POA: Diagnosis not present

## 2017-06-08 DIAGNOSIS — Z79899 Other long term (current) drug therapy: Secondary | ICD-10-CM | POA: Insufficient documentation

## 2017-06-08 DIAGNOSIS — E291 Testicular hypofunction: Secondary | ICD-10-CM | POA: Diagnosis not present

## 2017-06-08 DIAGNOSIS — L728 Other follicular cysts of the skin and subcutaneous tissue: Secondary | ICD-10-CM | POA: Insufficient documentation

## 2017-06-08 DIAGNOSIS — L309 Dermatitis, unspecified: Secondary | ICD-10-CM | POA: Diagnosis not present

## 2017-06-08 DIAGNOSIS — L723 Sebaceous cyst: Secondary | ICD-10-CM | POA: Diagnosis not present

## 2017-06-08 DIAGNOSIS — L729 Follicular cyst of the skin and subcutaneous tissue, unspecified: Secondary | ICD-10-CM | POA: Diagnosis not present

## 2017-06-08 DIAGNOSIS — J309 Allergic rhinitis, unspecified: Secondary | ICD-10-CM | POA: Diagnosis not present

## 2017-06-08 DIAGNOSIS — E785 Hyperlipidemia, unspecified: Secondary | ICD-10-CM | POA: Diagnosis not present

## 2017-06-08 HISTORY — PX: MASS EXCISION: SHX2000

## 2017-06-08 SURGERY — EXCISION MASS
Anesthesia: General | Site: Buttocks | Laterality: Left

## 2017-06-08 MED ORDER — FENTANYL CITRATE (PF) 100 MCG/2ML IJ SOLN
INTRAMUSCULAR | Status: AC
  Filled 2017-06-08: qty 2

## 2017-06-08 MED ORDER — BUPIVACAINE-EPINEPHRINE 0.25% -1:200000 IJ SOLN
INTRAMUSCULAR | Status: DC | PRN
  Administered 2017-06-08: 20 mL

## 2017-06-08 MED ORDER — LACTATED RINGERS IV SOLN
INTRAVENOUS | Status: DC
  Administered 2017-06-08 (×2): via INTRAVENOUS

## 2017-06-08 MED ORDER — IBUPROFEN 800 MG PO TABS: 800.0000 mg | ORAL_TABLET | Freq: Three times a day (TID) | ORAL | 0 refills | Status: DC | PRN

## 2017-06-08 MED ORDER — LIDOCAINE HCL (CARDIAC) 20 MG/ML IV SOLN
INTRAVENOUS | Status: DC | PRN
  Administered 2017-06-08: 30 mg via INTRAVENOUS

## 2017-06-08 MED ORDER — KETOROLAC TROMETHAMINE 30 MG/ML IJ SOLN: 30.0000 mg | Freq: Once | INTRAMUSCULAR | Status: DC | PRN

## 2017-06-08 MED ORDER — CHLORHEXIDINE GLUCONATE CLOTH 2 % EX PADS: 6.0000 | MEDICATED_PAD | Freq: Once | CUTANEOUS | Status: DC

## 2017-06-08 MED ORDER — PROMETHAZINE HCL 25 MG/ML IJ SOLN: 6.2500 mg | INTRAMUSCULAR | Status: DC | PRN

## 2017-06-08 MED ORDER — ONDANSETRON HCL 4 MG/2ML IJ SOLN
INTRAMUSCULAR | Status: AC
  Filled 2017-06-08: qty 2

## 2017-06-08 MED ORDER — FENTANYL CITRATE (PF) 100 MCG/2ML IJ SOLN: 50.0000 ug | INTRAMUSCULAR | Status: DC | PRN

## 2017-06-08 MED ORDER — EPHEDRINE SULFATE 50 MG/ML IJ SOLN
INTRAMUSCULAR | Status: DC | PRN
  Administered 2017-06-08: 10 mg via INTRAVENOUS

## 2017-06-08 MED ORDER — MIDAZOLAM HCL 5 MG/5ML IJ SOLN
INTRAMUSCULAR | Status: DC | PRN
  Administered 2017-06-08: 2 mg via INTRAVENOUS

## 2017-06-08 MED ORDER — CEFAZOLIN SODIUM 1 G IJ SOLR
INTRAMUSCULAR | Status: AC
  Filled 2017-06-08: qty 20

## 2017-06-08 MED ORDER — MIDAZOLAM HCL 2 MG/2ML IJ SOLN: 1.0000 mg | INTRAMUSCULAR | Status: DC | PRN

## 2017-06-08 MED ORDER — CEFAZOLIN SODIUM-DEXTROSE 2-3 GM-%(50ML) IV SOLR
INTRAVENOUS | Status: DC | PRN
  Administered 2017-06-08: 2 g via INTRAVENOUS

## 2017-06-08 MED ORDER — PROPOFOL 10 MG/ML IV BOLUS
INTRAVENOUS | Status: DC | PRN
  Administered 2017-06-08: 200 mg via INTRAVENOUS
  Administered 2017-06-08: 30 mg via INTRAVENOUS

## 2017-06-08 MED ORDER — PROPOFOL 10 MG/ML IV BOLUS
INTRAVENOUS | Status: AC
  Filled 2017-06-08: qty 20

## 2017-06-08 MED ORDER — FENTANYL CITRATE (PF) 100 MCG/2ML IJ SOLN
INTRAMUSCULAR | Status: DC | PRN
  Administered 2017-06-08 (×2): 25 ug via INTRAVENOUS
  Administered 2017-06-08: 100 ug via INTRAVENOUS
  Administered 2017-06-08: 25 ug via INTRAVENOUS

## 2017-06-08 MED ORDER — SCOPOLAMINE 1 MG/3DAYS TD PT72: 1.0000 | MEDICATED_PATCH | Freq: Once | TRANSDERMAL | Status: DC | PRN

## 2017-06-08 MED ORDER — FENTANYL CITRATE (PF) 100 MCG/2ML IJ SOLN: 25.0000 ug | INTRAMUSCULAR | Status: DC | PRN

## 2017-06-08 MED ORDER — ONDANSETRON HCL 4 MG/2ML IJ SOLN
INTRAMUSCULAR | Status: DC | PRN
  Administered 2017-06-08: 4 mg via INTRAVENOUS

## 2017-06-08 MED ORDER — DEXAMETHASONE SODIUM PHOSPHATE 4 MG/ML IJ SOLN
INTRAMUSCULAR | Status: DC | PRN
  Administered 2017-06-08: 10 mg via INTRAVENOUS

## 2017-06-08 MED ORDER — DEXAMETHASONE SODIUM PHOSPHATE 10 MG/ML IJ SOLN
INTRAMUSCULAR | Status: AC
  Filled 2017-06-08: qty 1

## 2017-06-08 MED ORDER — HYDROCODONE-ACETAMINOPHEN 5-325 MG PO TABS: 1.0000 | ORAL_TABLET | Freq: Four times a day (QID) | ORAL | 0 refills | Status: DC | PRN

## 2017-06-08 MED ORDER — LIDOCAINE 2% (20 MG/ML) 5 ML SYRINGE
INTRAMUSCULAR | Status: AC
  Filled 2017-06-08: qty 5

## 2017-06-08 MED ORDER — MIDAZOLAM HCL 2 MG/2ML IJ SOLN
INTRAMUSCULAR | Status: AC
  Filled 2017-06-08: qty 2

## 2017-06-08 MED ORDER — CEFAZOLIN SODIUM 10 G IJ SOLR: 3.0000 g | INTRAMUSCULAR | Status: DC

## 2017-06-08 SURGICAL SUPPLY — 41 items
ADH SKN CLS APL DERMABOND .7 (GAUZE/BANDAGES/DRESSINGS) ×1
APL SKNCLS STERI-STRIP NONHPOA (GAUZE/BANDAGES/DRESSINGS)
BENZOIN TINCTURE PRP APPL 2/3 (GAUZE/BANDAGES/DRESSINGS) IMPLANT
BLADE SURG 10 STRL SS (BLADE) IMPLANT
BLADE SURG 15 STRL LF DISP TIS (BLADE) ×1 IMPLANT
BLADE SURG 15 STRL SS (BLADE) ×2
CANISTER SUCT 1200ML W/VALVE (MISCELLANEOUS) IMPLANT
CHLORAPREP W/TINT 26ML (MISCELLANEOUS) ×1 IMPLANT
COVER BACK TABLE 60X90IN (DRAPES) ×2 IMPLANT
COVER MAYO STAND STRL (DRAPES) ×2 IMPLANT
DECANTER SPIKE VIAL GLASS SM (MISCELLANEOUS) IMPLANT
DERMABOND ADVANCED (GAUZE/BANDAGES/DRESSINGS) ×1
DERMABOND ADVANCED .7 DNX12 (GAUZE/BANDAGES/DRESSINGS) IMPLANT
DRAPE LAPAROTOMY 100X72 PEDS (DRAPES) ×2 IMPLANT
DRAPE UTILITY XL STRL (DRAPES) ×2 IMPLANT
ELECT COATED BLADE 2.86 ST (ELECTRODE) ×2 IMPLANT
ELECT REM PT RETURN 9FT ADLT (ELECTROSURGICAL) ×2
ELECTRODE REM PT RTRN 9FT ADLT (ELECTROSURGICAL) ×1 IMPLANT
GLOVE BIOGEL PI IND STRL 8 (GLOVE) ×1 IMPLANT
GLOVE BIOGEL PI INDICATOR 8 (GLOVE) ×1
GLOVE ECLIPSE 8.0 STRL XLNG CF (GLOVE) ×2 IMPLANT
GOWN STRL REUS W/ TWL LRG LVL3 (GOWN DISPOSABLE) ×2 IMPLANT
GOWN STRL REUS W/TWL LRG LVL3 (GOWN DISPOSABLE) ×4
NDL HYPO 25X1 1.5 SAFETY (NEEDLE) ×1 IMPLANT
NEEDLE HYPO 25X1 1.5 SAFETY (NEEDLE) ×2 IMPLANT
NS IRRIG 1000ML POUR BTL (IV SOLUTION) IMPLANT
PACK BASIN DAY SURGERY FS (CUSTOM PROCEDURE TRAY) ×2 IMPLANT
PENCIL BUTTON HOLSTER BLD 10FT (ELECTRODE) ×2 IMPLANT
SLEEVE SCD COMPRESS KNEE MED (MISCELLANEOUS) ×2 IMPLANT
SPONGE LAP 4X18 X RAY DECT (DISPOSABLE) ×1 IMPLANT
STAPLER VISISTAT 35W (STAPLE) IMPLANT
STRIP CLOSURE SKIN 1/2X4 (GAUZE/BANDAGES/DRESSINGS) IMPLANT
SUT MON AB 4-0 PC3 18 (SUTURE) ×2 IMPLANT
SUT VICRYL 3-0 CR8 SH (SUTURE) ×2 IMPLANT
SUT VICRYL AB 3 0 TIES (SUTURE) IMPLANT
SYR CONTROL 10ML LL (SYRINGE) ×2 IMPLANT
TOWEL OR 17X24 6PK STRL BLUE (TOWEL DISPOSABLE) ×4 IMPLANT
TOWEL OR NON WOVEN STRL DISP B (DISPOSABLE) ×2 IMPLANT
TRAY DSU PREP LF (CUSTOM PROCEDURE TRAY) ×1 IMPLANT
TUBE CONNECTING 20X1/4 (TUBING) IMPLANT
YANKAUER SUCT BULB TIP NO VENT (SUCTIONS) IMPLANT

## 2017-06-08 NOTE — Op Note (Signed)
Preoperative diagnosis: Left medial buttock cyst measuring 4 cm x 5 cm subcutaneous  Postop diagnosis same  Procedure excision of medial left buttock cyst  Surgeon: Erroll Luna MD  Anesthesia: LMA with local  EBL: 10 cc  Specimen: Buttock cyst to pathology  Drains: None  IV fluids: Per anesthesia record  Indications for procedure: The patient presents due to a chronically inflamed medial left buttock cyst near the midline.  He has had multiple rounds of antibiotics but this is not resolved the problem and it recurs.  After examination he was found to have a small subcutaneous cyst which more likely represents folliculitis and less likely pilonidal disease.  Risk, benefits and long-term expectations of surgery discussed.  Postoperative care was discussed.  Risk of the procedure were discussed with the patient at the bedside again today.  He agreed to proceed.The procedure has been discussed with the patient.  Alternative therapies have been discussed with the patient.  Operative risks include bleeding,  Infection,  Organ injury,  Nerve injury,  Blood vessel injury,  DVT,  Pulmonary embolism,  Death,  And possible reoperation.  Medical management risks include worsening of present situation.  The success of the procedure is 50 -90 % at treating patients symptoms.  The patient understands and agrees to proceed.    Description of procedure: The patient was met in the holding area.  The area in the medial left buttock near the midline was marked with the assistance of the patient.  Questions were answered.  He was taken back to the operating room and placed upon the OR table.  After induction of LMA anesthesia was rolled onto his right side to better visualize the cyst in the medial left buttock near the midline.  A timeout was done after sterile prep and drape.  Local anesthetic was infiltrated and a 4 cm x 5 cm cyst was excised.  This appeared to be consistent with either an epidermal inclusion  cyst or folliculitis.  There are no sinus tracts or other etiology or pathology.  Once this was excised it was closed with 3-0 Vicryl and 4-0 Monocryl.  All final counts were found to be correct.  Local anesthetic was infiltrated around the incision.  Dermabond applied.  The patient was then placed supine extubated taken to recovery in satisfactory condition.

## 2017-06-08 NOTE — Transfer of Care (Signed)
Immediate Anesthesia Transfer of Care Note  Patient: Kenneth ChengKenneth A Risby  Procedure(s) Performed: EXCISION CYST BUTTOCK (Left Buttocks)  Patient Location: PACU  Anesthesia Type:General  Level of Consciousness: sedated  Airway & Oxygen Therapy: Patient Spontanous Breathing and Patient connected to face mask oxygen  Post-op Assessment: Report given to RN and Post -op Vital signs reviewed and stable  Post vital signs: Reviewed and stable  Last Vitals:  Vitals:   06/08/17 1219  BP: 118/61  Pulse: 79  Resp: 18  Temp: 37.1 C  SpO2: 98%    Last Pain:  Vitals:   06/08/17 1219  TempSrc: Oral         Complications: No apparent anesthesia complications

## 2017-06-08 NOTE — Discharge Instructions (Signed)
GENERAL SURGERY: POST OP INSTRUCTIONS ° °###################################################################### ° °EAT °Gradually transition to a high fiber diet with a fiber supplement over the next few weeks after discharge.  Start with a pureed / full liquid diet (see below) ° °WALK °Walk an hour a day.  Control your pain to do that.   ° °CONTROL PAIN °Control pain so that you can walk, sleep, tolerate sneezing/coughing, go up/down stairs. ° °HAVE A BOWEL MOVEMENT DAILY °Keep your bowels regular to avoid problems.  OK to try a laxative to override constipation.  OK to use an antidairrheal to slow down diarrhea.  Call if not better after 2 tries ° °CALL IF YOU HAVE PROBLEMS/CONCERNS °Call if you are still struggling despite following these instructions. °Call if you have concerns not answered by these instructions ° °###################################################################### ° ° ° °1. DIET: Follow a light bland diet the first 24 hours after arrival home, such as soup, liquids, crackers, etc.  Be sure to include lots of fluids daily.  Avoid fast food or heavy meals as your are more likely to get nauseated.   °2. Take your usually prescribed home medications unless otherwise directed. °3. PAIN CONTROL: °a. Pain is best controlled by a usual combination of three different methods TOGETHER: °i. Ice/Heat °ii. Over the counter pain medication °iii. Prescription pain medication °b. Most patients will experience some swelling and bruising around the incisions.  Ice packs or heating pads (30-60 minutes up to 6 times a day) will help. Use ice for the first few days to help decrease swelling and bruising, then switch to heat to help relax tight/sore spots and speed recovery.  Some people prefer to use ice alone, heat alone, alternating between ice & heat.  Experiment to what works for you.  Swelling and bruising can take several weeks to resolve.   °c. It is helpful to take an over-the-counter pain medication  regularly for the first few weeks.  Choose one of the following that works best for you: °i. Naproxen (Aleve, etc)  Two 220mg tabs twice a day °ii. Ibuprofen (Advil, etc) Three 200mg tabs four times a day (every meal & bedtime) °iii. Acetaminophen (Tylenol, etc) 500-650mg four times a day (every meal & bedtime) °d. A  prescription for pain medication (such as oxycodone, hydrocodone, etc) should be given to you upon discharge.  Take your pain medication as prescribed.  °i. If you are having problems/concerns with the prescription medicine (does not control pain, nausea, vomiting, rash, itching, etc), please call us [phone] to see if we need to switch you to a different pain medicine that will work better for you and/or control your side effect better. °ii. If you need a refill on your pain medication, please contact your pharmacy.  They will contact our office to request authorization. Prescriptions will not be filled after 5 pm or on week-ends. °4. Avoid getting constipated.  Between the surgery and the pain medications, it is common to experience some constipation.  Increasing fluid intake and taking a fiber supplement (such as Metamucil, Citrucel, FiberCon, MiraLax, etc) 1-2 times a day regularly will usually help prevent this problem from occurring.  A mild laxative (prune juice, Milk of Magnesia, MiraLax, etc) should be taken according to package directions if there are no bowel movements after 48 hours.   °5. Wash / shower every day.  You may shower over the dressings as they are waterproof.  Continue to shower over incision(s) after the dressing is off. °6. Remove your waterproof bandages   5 days after surgery.  You may leave the incision open to air.  You may have skin tapes (Steri Strips) covering the incision(s).  Leave them on until one week, then remove.  You may replace a dressing/Band-Aid to cover the incision for comfort if you wish.  ° ° ° ° °7. ACTIVITIES as tolerated:   °a. You may resume  regular (light) daily activities beginning the next day--such as daily self-care, walking, climbing stairs--gradually increasing activities as tolerated.  If you can walk 30 minutes without difficulty, it is safe to try more intense activity such as jogging, treadmill, bicycling, low-impact aerobics, swimming, etc. °b. Save the most intensive and strenuous activity for last such as sit-ups, heavy lifting, contact sports, etc  Refrain from any heavy lifting or straining until you are off narcotics for pain control.   °c. DO NOT PUSH THROUGH PAIN.  Let pain be your guide: If it hurts to do something, don't do it.  Pain is your body warning you to avoid that activity for another week until the pain goes down. °d. You may drive when you are no longer taking prescription pain medication, you can comfortably wear a seatbelt, and you can safely maneuver your car and apply brakes. °e. You may have sexual intercourse when it is comfortable.  °8. FOLLOW UP in our office °a. Please call [facility] at [phone] to set up an appointment to see your surgeon in the office for a follow-up appointment approximately 2-3 weeks after your surgery. °b. Make sure that you call for this appointment the day you arrive home to insure a convenient appointment time. °9. IF YOU HAVE DISABILITY OR FAMILY LEAVE FORMS, BRING THEM TO THE OFFICE FOR PROCESSING.  DO NOT GIVE THEM TO YOUR DOCTOR. ° ° °WHEN TO CALL US [phone]: °1. Poor pain control °2. Reactions / problems with new medications (rash/itching, nausea, etc)  °3. Fever over 101.5 F (38.5 C) °4. Worsening swelling or bruising °5. Continued bleeding from incision. °6. Increased pain, redness, or drainage from the incision °7. Difficulty breathing / swallowing ° ° The clinic staff is available to answer your questions during regular business hours (8:30am-5pm).  Please don’t hesitate to call and ask to speak to one of our nurses for clinical concerns.  ° If you have a medical emergency,  go to the nearest emergency room or call 911. ° A surgeon from [facility] is always on call at the hospitals ° ° °[facility] °[address], [address], [place], [place]  [address] ? °MAIN: [phone] ? TOLL FREE: 1-800-359-8415 ?  °FAX [phone] °[email or web address] ° ° °Post Anesthesia Home Care Instructions ° °Activity: °Get plenty of rest for the remainder of the day. A responsible individual must stay with you for 24 hours following the procedure.  °For the next 24 hours, DO NOT: °-Drive a car °-Operate machinery °-Drink alcoholic beverages °-Take any medication unless instructed by your physician °-Make any legal decisions or sign important papers. ° °Meals: °Start with liquid foods such as gelatin or soup. Progress to regular foods as tolerated. Avoid greasy, spicy, heavy foods. If nausea and/or vomiting occur, drink only clear liquids until the nausea and/or vomiting subsides. Call your physician if vomiting continues. ° °Special Instructions/Symptoms: °Your throat may feel dry or sore from the anesthesia or the breathing tube placed in your throat during surgery. If this causes discomfort, gargle with warm salt water. The discomfort should disappear within 24 hours. ° °  If you had a scopolamine patch placed behind your ear for the management of post- operative nausea and/or vomiting: ° °1. The medication in the patch is effective for 72 hours, after which it should be removed.  Wrap patch in a tissue and discard in the trash. Wash hands thoroughly with soap and water. °2. You may remove the patch earlier than 72 hours if you experience unpleasant side effects which may include dry mouth, dizziness or visual disturbances. °3. Avoid touching the patch. Wash your hands with soap and water after contact with the patch. °  ° °

## 2017-06-08 NOTE — Interval H&P Note (Signed)
History and Physical Interval Note:  06/08/2017 1:05 PM  Kenneth ChengKenneth A Knapp  has presented today for surgery, with the diagnosis of CYST INFLAMMED  The various methods of treatment have been discussed with the patient and family. After consideration of risks, benefits and other options for treatment, the patient has consented to  Procedure(s): EXCISION CYST BUTTOCK (N/A) as a surgical intervention .  The patient's history has been reviewed, patient examined, no change in status, stable for surgery.  I have reviewed the patient's chart and labs.  Questions were answered to the patient's satisfaction.     Kenneth Knapp

## 2017-06-08 NOTE — Anesthesia Preprocedure Evaluation (Signed)
Anesthesia Evaluation  Patient identified by MRN, date of birth, ID band Patient awake    Reviewed: Allergy & Precautions, NPO status , Patient's Chart, lab work & pertinent test results  Airway Mallampati: II  TM Distance: >3 FB Neck ROM: Full    Dental no notable dental hx.    Pulmonary neg pulmonary ROS,    Pulmonary exam normal breath sounds clear to auscultation       Cardiovascular negative cardio ROS Normal cardiovascular exam Rhythm:Regular Rate:Normal     Neuro/Psych negative neurological ROS  negative psych ROS   GI/Hepatic negative GI ROS, Neg liver ROS,   Endo/Other  negative endocrine ROS  Renal/GU negative Renal ROS  negative genitourinary   Musculoskeletal negative musculoskeletal ROS (+)   Abdominal   Peds negative pediatric ROS (+)  Hematology negative hematology ROS (+)   Anesthesia Other Findings   Reproductive/Obstetrics negative OB ROS                             Anesthesia Physical Anesthesia Plan  ASA: I  Anesthesia Plan: General   Post-op Pain Management:    Induction: Intravenous  PONV Risk Score and Plan: 2 and Ondansetron and Dexamethasone  Airway Management Planned: LMA and Oral ETT  Additional Equipment:   Intra-op Plan:   Post-operative Plan: Extubation in OR  Informed Consent: I have reviewed the patients History and Physical, chart, labs and discussed the procedure including the risks, benefits and alternatives for the proposed anesthesia with the patient or authorized representative who has indicated his/her understanding and acceptance.   Dental advisory given  Plan Discussed with: CRNA and Surgeon  Anesthesia Plan Comments: (ETT if prone)        Anesthesia Quick Evaluation

## 2017-06-08 NOTE — Anesthesia Procedure Notes (Signed)
Procedure Name: LMA Insertion Date/Time: 06/08/2017 1:34 PM Performed by: Danforth DesanctisLinka, Bryton Romagnoli L, CRNA Pre-anesthesia Checklist: Patient identified, Emergency Drugs available, Suction available, Patient being monitored and Timeout performed Patient Re-evaluated:Patient Re-evaluated prior to induction Oxygen Delivery Method: Circle system utilized Preoxygenation: Pre-oxygenation with 100% oxygen Induction Type: IV induction Ventilation: Mask ventilation without difficulty LMA: LMA inserted LMA Size: 5.0 Number of attempts: 1 Airway Equipment and Method: Bite block Placement Confirmation: positive ETCO2 Tube secured with: Tape Dental Injury: Teeth and Oropharynx as per pre-operative assessment

## 2017-06-08 NOTE — Anesthesia Postprocedure Evaluation (Signed)
Anesthesia Post Note  Patient: Wonda ChengKenneth A Mezo  Procedure(s) Performed: EXCISION CYST BUTTOCK (Left Buttocks)     Patient location during evaluation: PACU Anesthesia Type: General Level of consciousness: awake and alert Pain management: pain level controlled Vital Signs Assessment: post-procedure vital signs reviewed and stable Respiratory status: spontaneous breathing, nonlabored ventilation, respiratory function stable and patient connected to nasal cannula oxygen Cardiovascular status: blood pressure returned to baseline and stable Postop Assessment: no apparent nausea or vomiting Anesthetic complications: no    Last Vitals:  Vitals:   06/08/17 1421 06/08/17 1430  BP: (!) 118/59 (!) 115/58  Pulse: 89 82  Resp: 16 13  Temp: (!) 36.4 C   SpO2: 100% 100%    Last Pain:  Vitals:   06/08/17 1421  TempSrc:   PainSc: Asleep                 Zakry Caso S

## 2017-06-09 ENCOUNTER — Encounter (HOSPITAL_BASED_OUTPATIENT_CLINIC_OR_DEPARTMENT_OTHER): Payer: Self-pay | Admitting: Surgery

## 2017-06-14 ENCOUNTER — Ambulatory Visit: Payer: BLUE CROSS/BLUE SHIELD | Admitting: Orthotics

## 2017-06-14 DIAGNOSIS — M779 Enthesopathy, unspecified: Secondary | ICD-10-CM

## 2017-06-14 NOTE — Progress Notes (Signed)
Patient came in today to pick up custom made foot orthotics.  The goals were accomplished and the patient reported no dissatisfaction with said orthotics.  Patient was advised of breakin period and how to report any issues. 

## 2017-06-16 ENCOUNTER — Ambulatory Visit (INDEPENDENT_AMBULATORY_CARE_PROVIDER_SITE_OTHER): Payer: BLUE CROSS/BLUE SHIELD | Admitting: *Deleted

## 2017-06-16 ENCOUNTER — Ambulatory Visit: Payer: BLUE CROSS/BLUE SHIELD

## 2017-06-16 DIAGNOSIS — J3089 Other allergic rhinitis: Secondary | ICD-10-CM | POA: Diagnosis not present

## 2017-06-16 DIAGNOSIS — E349 Endocrine disorder, unspecified: Secondary | ICD-10-CM

## 2017-06-16 DIAGNOSIS — J3081 Allergic rhinitis due to animal (cat) (dog) hair and dander: Secondary | ICD-10-CM | POA: Diagnosis not present

## 2017-06-16 DIAGNOSIS — J301 Allergic rhinitis due to pollen: Secondary | ICD-10-CM | POA: Diagnosis not present

## 2017-06-16 MED ORDER — TESTOSTERONE CYPIONATE 200 MG/ML IM SOLN
300.0000 mg | Freq: Once | INTRAMUSCULAR | Status: AC
  Administered 2017-06-16: 300 mg via INTRAMUSCULAR

## 2017-06-16 NOTE — Progress Notes (Addendum)
Per orders of Dr. Caryl NeverBurchette, injection of testosterone given by Starla Linkarolyn J Jonne Rote. Patient tolerated injection well.  Starla Linkarolyn J Graiden Henes, RN   Agree with injection as given above.  Kristian CoveyBruce W Burchette MD [facility]

## 2017-06-30 ENCOUNTER — Ambulatory Visit: Payer: BLUE CROSS/BLUE SHIELD

## 2017-06-30 DIAGNOSIS — J3089 Other allergic rhinitis: Secondary | ICD-10-CM | POA: Diagnosis not present

## 2017-06-30 DIAGNOSIS — J301 Allergic rhinitis due to pollen: Secondary | ICD-10-CM | POA: Diagnosis not present

## 2017-06-30 DIAGNOSIS — J3081 Allergic rhinitis due to animal (cat) (dog) hair and dander: Secondary | ICD-10-CM | POA: Diagnosis not present

## 2017-07-02 ENCOUNTER — Ambulatory Visit (INDEPENDENT_AMBULATORY_CARE_PROVIDER_SITE_OTHER): Payer: BLUE CROSS/BLUE SHIELD

## 2017-07-02 DIAGNOSIS — R7989 Other specified abnormal findings of blood chemistry: Secondary | ICD-10-CM

## 2017-07-02 MED ORDER — TESTOSTERONE CYPIONATE 200 MG/ML IM SOLN
300.0000 mg | INTRAMUSCULAR | Status: DC
  Administered 2017-07-02: 300 mg via INTRAMUSCULAR

## 2017-07-16 ENCOUNTER — Ambulatory Visit (INDEPENDENT_AMBULATORY_CARE_PROVIDER_SITE_OTHER): Payer: BLUE CROSS/BLUE SHIELD

## 2017-07-16 DIAGNOSIS — R7989 Other specified abnormal findings of blood chemistry: Secondary | ICD-10-CM

## 2017-07-16 MED ORDER — TESTOSTERONE CYPIONATE 200 MG/ML IM SOLN
300.0000 mg | INTRAMUSCULAR | Status: DC
  Administered 2017-07-16: 300 mg via INTRAMUSCULAR

## 2017-07-16 NOTE — Progress Notes (Addendum)
Patient received his testosterone injection and tolerated the injection well.  Agree with testosterone injection as given  Kristian CoveyBruce W Burchette MD [facility]

## 2017-07-16 NOTE — Patient Instructions (Signed)
Patient received his testosterone injection and tolerated the injection well.

## 2017-07-22 DIAGNOSIS — J301 Allergic rhinitis due to pollen: Secondary | ICD-10-CM | POA: Diagnosis not present

## 2017-07-22 DIAGNOSIS — J3089 Other allergic rhinitis: Secondary | ICD-10-CM | POA: Diagnosis not present

## 2017-07-22 DIAGNOSIS — J3081 Allergic rhinitis due to animal (cat) (dog) hair and dander: Secondary | ICD-10-CM | POA: Diagnosis not present

## 2017-07-26 ENCOUNTER — Other Ambulatory Visit: Payer: Self-pay | Admitting: Family Medicine

## 2017-07-26 NOTE — Telephone Encounter (Signed)
Needs office follow up   May refill for one month until follow up.

## 2017-07-26 NOTE — Telephone Encounter (Signed)
Last office visit 06/17/16.  Last refill Ambien 04/14/17 and testosterone 05/24/17.  Labs for testosterone 05/21/17.  Okay to fill?

## 2017-07-27 ENCOUNTER — Encounter: Payer: Self-pay | Admitting: *Deleted

## 2017-07-27 DIAGNOSIS — J301 Allergic rhinitis due to pollen: Secondary | ICD-10-CM | POA: Diagnosis not present

## 2017-07-27 DIAGNOSIS — J3081 Allergic rhinitis due to animal (cat) (dog) hair and dander: Secondary | ICD-10-CM | POA: Diagnosis not present

## 2017-07-27 DIAGNOSIS — J3089 Other allergic rhinitis: Secondary | ICD-10-CM | POA: Diagnosis not present

## 2017-07-28 ENCOUNTER — Ambulatory Visit (INDEPENDENT_AMBULATORY_CARE_PROVIDER_SITE_OTHER): Payer: BLUE CROSS/BLUE SHIELD | Admitting: Family Medicine

## 2017-07-28 DIAGNOSIS — E349 Endocrine disorder, unspecified: Secondary | ICD-10-CM | POA: Diagnosis not present

## 2017-07-28 DIAGNOSIS — J3089 Other allergic rhinitis: Secondary | ICD-10-CM | POA: Diagnosis not present

## 2017-07-28 DIAGNOSIS — J3081 Allergic rhinitis due to animal (cat) (dog) hair and dander: Secondary | ICD-10-CM | POA: Diagnosis not present

## 2017-07-28 MED ORDER — TESTOSTERONE CYPIONATE 200 MG/ML IM SOLN
300.0000 mg | Freq: Once | INTRAMUSCULAR | Status: AC
  Administered 2017-07-28: 300 mg via INTRAMUSCULAR

## 2017-07-28 NOTE — Progress Notes (Signed)
Agree with testosterone injection as given  Gicela Schwarting W Shacola Schussler MD [facility]  

## 2017-08-04 DIAGNOSIS — J301 Allergic rhinitis due to pollen: Secondary | ICD-10-CM | POA: Diagnosis not present

## 2017-08-04 DIAGNOSIS — J3089 Other allergic rhinitis: Secondary | ICD-10-CM | POA: Diagnosis not present

## 2017-08-04 DIAGNOSIS — J3081 Allergic rhinitis due to animal (cat) (dog) hair and dander: Secondary | ICD-10-CM | POA: Diagnosis not present

## 2017-08-09 DIAGNOSIS — J3081 Allergic rhinitis due to animal (cat) (dog) hair and dander: Secondary | ICD-10-CM | POA: Diagnosis not present

## 2017-08-09 DIAGNOSIS — J301 Allergic rhinitis due to pollen: Secondary | ICD-10-CM | POA: Diagnosis not present

## 2017-08-09 DIAGNOSIS — J3089 Other allergic rhinitis: Secondary | ICD-10-CM | POA: Diagnosis not present

## 2017-08-11 ENCOUNTER — Ambulatory Visit (INDEPENDENT_AMBULATORY_CARE_PROVIDER_SITE_OTHER): Payer: BLUE CROSS/BLUE SHIELD | Admitting: Family Medicine

## 2017-08-11 ENCOUNTER — Other Ambulatory Visit: Payer: Self-pay | Admitting: Family Medicine

## 2017-08-11 DIAGNOSIS — E349 Endocrine disorder, unspecified: Secondary | ICD-10-CM

## 2017-08-11 MED ORDER — TESTOSTERONE CYPIONATE 200 MG/ML IM SOLN
300.0000 mg | Freq: Once | INTRAMUSCULAR | Status: AC
  Administered 2017-08-11: 300 mg via INTRAMUSCULAR

## 2017-08-11 NOTE — Progress Notes (Signed)
Per orders of Dr. Caryl Neverburchette, injection of testosterone given by Aniceto BossNIMMONS, Arminta Gamm ANN. Patient tolerated injection well.

## 2017-08-16 ENCOUNTER — Ambulatory Visit: Payer: BLUE CROSS/BLUE SHIELD | Admitting: Family Medicine

## 2017-08-16 ENCOUNTER — Encounter: Payer: Self-pay | Admitting: Family Medicine

## 2017-08-16 VITALS — BP 110/64 | HR 74 | Temp 98.1°F | Wt 175.3 lb

## 2017-08-16 DIAGNOSIS — E349 Endocrine disorder, unspecified: Secondary | ICD-10-CM | POA: Diagnosis not present

## 2017-08-16 DIAGNOSIS — N529 Male erectile dysfunction, unspecified: Secondary | ICD-10-CM | POA: Diagnosis not present

## 2017-08-16 DIAGNOSIS — J3089 Other allergic rhinitis: Secondary | ICD-10-CM | POA: Diagnosis not present

## 2017-08-16 DIAGNOSIS — E785 Hyperlipidemia, unspecified: Secondary | ICD-10-CM | POA: Diagnosis not present

## 2017-08-16 DIAGNOSIS — J3081 Allergic rhinitis due to animal (cat) (dog) hair and dander: Secondary | ICD-10-CM | POA: Diagnosis not present

## 2017-08-16 DIAGNOSIS — J301 Allergic rhinitis due to pollen: Secondary | ICD-10-CM | POA: Diagnosis not present

## 2017-08-16 NOTE — Progress Notes (Signed)
Subjective:     Patient ID: Kenneth ChengKenneth A Knapp, male   DOB: 1961/06/30, 57 y.o.   MRN: [id number]  HPI Patient here for medical follow-up. His chronic problems include history of low testosterone and hyperlipidemia. He's also had some chronic intermittent low back pains and has been taking daily diclofenac for quite some time. Takes lovastatin for hyperlipidemia. No myalgias. Energy levels are good. He feels much better symptomatically on testosterone replacement. Recent testosterone level 2 months ago which was over 500. He's currently on injectable 300 milligrams every other week.  History of erectile dysfunction and takes Viagra or Cialis. Overdue for lab work. Needs follow-up CBC and PSA. He is not fasting today  Past Medical History:  Diagnosis Date  . ALLERGIC RHINITIS 08/03/2007  . BENIGN PROSTATIC HYPERTROPHY 08/03/2007  . Chronic low back pain   . HYPERLIPIDEMIA 08/03/2007  . TESTICULAR HYPOFUNCTION 12/04/2009   Past Surgical History:  Procedure Laterality Date  . BACK SURGERY  2002  . MASS EXCISION Left 06/08/2017   Procedure: EXCISION CYST BUTTOCK;  Surgeon: Harriette Bouillonornett, Thomas, MD;  Location: [facility];  Service: General;  Laterality: Left;  . WISDOM TOOTH EXTRACTION      reports that  has never smoked. he has never used smokeless tobacco. He reports that he drinks alcohol. He reports that he does not use drugs. family history includes ALS in his mother; Arthritis in his mother; Colon cancer in his paternal grandmother; Heart disease in his father and paternal grandfather; Stroke (age of onset: 4573) in his father. No Known Allergies   Review of Systems  Constitutional: Negative for fatigue.  Eyes: Negative for visual disturbance.  Respiratory: Negative for cough, chest tightness and shortness of breath.   Cardiovascular: Negative for chest pain, palpitations and leg swelling.  Neurological: Negative for dizziness, syncope, weakness, light-headedness and headaches.        Objective:   Physical Exam  Constitutional: He is oriented to person, place, and time. He appears well-developed and well-nourished.  HENT:  Right Ear: External ear normal.  Left Ear: External ear normal.  Mouth/Throat: Oropharynx is clear and moist.  Eyes: Pupils are equal, round, and reactive to light.  Neck: Neck supple. No thyromegaly present.  Cardiovascular: Normal rate and regular rhythm.  Pulmonary/Chest: Effort normal and breath sounds normal. No respiratory distress. He has no wheezes. He has no rales.  Musculoskeletal: He exhibits no edema.  Neurological: He is alert and oriented to person, place, and time.       Assessment:     #1 low testosterone  #2 hyperlipidemia  #3 erectile dysfunction    Plan:     -Return for fasting labs including lipid panel, hepatic panel, basic metabolic panel, CBC, PSA -Will not repeat testosterone since this was done 2 months ago and  normal -Discussed new shingles vaccine and he will check on insurance coverage  Kristian CoveyBruce W Shawndra Clute MD [facility]

## 2017-08-20 ENCOUNTER — Other Ambulatory Visit: Payer: Self-pay | Admitting: Family Medicine

## 2017-08-20 NOTE — Telephone Encounter (Signed)
Refills OK. 

## 2017-08-24 DIAGNOSIS — L089 Local infection of the skin and subcutaneous tissue, unspecified: Secondary | ICD-10-CM | POA: Diagnosis not present

## 2017-08-24 DIAGNOSIS — L723 Sebaceous cyst: Secondary | ICD-10-CM | POA: Diagnosis not present

## 2017-08-27 ENCOUNTER — Other Ambulatory Visit (INDEPENDENT_AMBULATORY_CARE_PROVIDER_SITE_OTHER): Payer: BLUE CROSS/BLUE SHIELD

## 2017-08-27 ENCOUNTER — Ambulatory Visit (INDEPENDENT_AMBULATORY_CARE_PROVIDER_SITE_OTHER): Payer: BLUE CROSS/BLUE SHIELD

## 2017-08-27 ENCOUNTER — Other Ambulatory Visit: Payer: BLUE CROSS/BLUE SHIELD

## 2017-08-27 DIAGNOSIS — L0889 Other specified local infections of the skin and subcutaneous tissue: Secondary | ICD-10-CM | POA: Diagnosis not present

## 2017-08-27 DIAGNOSIS — E785 Hyperlipidemia, unspecified: Secondary | ICD-10-CM

## 2017-08-27 DIAGNOSIS — E349 Endocrine disorder, unspecified: Secondary | ICD-10-CM | POA: Diagnosis not present

## 2017-08-27 DIAGNOSIS — J3081 Allergic rhinitis due to animal (cat) (dog) hair and dander: Secondary | ICD-10-CM | POA: Diagnosis not present

## 2017-08-27 DIAGNOSIS — L720 Epidermal cyst: Secondary | ICD-10-CM | POA: Diagnosis not present

## 2017-08-27 DIAGNOSIS — Z0000 Encounter for general adult medical examination without abnormal findings: Secondary | ICD-10-CM | POA: Diagnosis not present

## 2017-08-27 DIAGNOSIS — J301 Allergic rhinitis due to pollen: Secondary | ICD-10-CM | POA: Diagnosis not present

## 2017-08-27 DIAGNOSIS — R7989 Other specified abnormal findings of blood chemistry: Secondary | ICD-10-CM | POA: Diagnosis not present

## 2017-08-27 DIAGNOSIS — J3089 Other allergic rhinitis: Secondary | ICD-10-CM | POA: Diagnosis not present

## 2017-08-27 LAB — CBC WITH DIFFERENTIAL/PLATELET
BASOS ABS: 50 {cells}/uL (ref 0–200)
Basophils Relative: 0.6 %
EOS ABS: 328 {cells}/uL (ref 15–500)
Eosinophils Relative: 3.9 %
HEMATOCRIT: 47.2 % (ref 38.5–50.0)
HEMOGLOBIN: 16.4 g/dL (ref 13.2–17.1)
LYMPHS ABS: 1159 {cells}/uL (ref 850–3900)
MCH: 31.3 pg (ref 27.0–33.0)
MCHC: 34.7 g/dL (ref 32.0–36.0)
MCV: 90.1 fL (ref 80.0–100.0)
MONOS PCT: 8.4 %
MPV: 10.2 fL (ref 7.5–12.5)
NEUTROS ABS: 6157 {cells}/uL (ref 1500–7800)
Neutrophils Relative %: 73.3 %
Platelets: 229 10*3/uL (ref 140–400)
RBC: 5.24 10*6/uL (ref 4.20–5.80)
RDW: 12.1 % (ref 11.0–15.0)
Total Lymphocyte: 13.8 %
WBC mixed population: 706 cells/uL (ref 200–950)
WBC: 8.4 10*3/uL (ref 3.8–10.8)

## 2017-08-27 LAB — BASIC METABOLIC PANEL
BUN: 15 mg/dL (ref 6–23)
CHLORIDE: 103 meq/L (ref 96–112)
CO2: 28 meq/L (ref 19–32)
Calcium: 8.6 mg/dL (ref 8.4–10.5)
Creatinine, Ser: 0.92 mg/dL (ref 0.40–1.50)
GFR: 90.32 mL/min (ref >60.00)
GLUCOSE: 111 mg/dL — AB (ref 70–99)
POTASSIUM: 3.9 meq/L (ref 3.5–5.1)
SODIUM: 138 meq/L (ref 135–145)

## 2017-08-27 LAB — LIPID PANEL
CHOL/HDL RATIO: 3
CHOLESTEROL: 120 mg/dL (ref 0–200)
HDL: 41.1 mg/dL (ref >39.00)
LDL CALC: 66 mg/dL (ref 0–99)
NONHDL: 79.17
Triglycerides: 68 mg/dL (ref 0.0–149.0)
VLDL: 13.6 mg/dL (ref 0.0–40.0)

## 2017-08-27 LAB — TSH: TSH: 2.73 u[IU]/mL (ref 0.35–4.50)

## 2017-08-27 LAB — HEPATIC FUNCTION PANEL
ALT: 21 U/L (ref 0–53)
AST: 21 U/L (ref 0–37)
Albumin: 3.9 g/dL (ref 3.5–5.2)
Alkaline Phosphatase: 61 U/L (ref 39–117)
BILIRUBIN DIRECT: 0.2 mg/dL (ref 0.0–0.3)
BILIRUBIN TOTAL: 0.8 mg/dL (ref 0.2–1.2)
Total Protein: 6.4 g/dL (ref 6.0–8.3)

## 2017-08-27 LAB — PSA: PSA: 0.95 ng/mL (ref 0.10–4.00)

## 2017-08-27 LAB — TESTOSTERONE: TESTOSTERONE: 408.42 ng/dL (ref 300.00–890.00)

## 2017-08-27 MED ORDER — TESTOSTERONE CYPIONATE 200 MG/ML IM SOLN
300.0000 mg | INTRAMUSCULAR | Status: DC
  Administered 2017-08-27: 300 mg via INTRAMUSCULAR

## 2017-08-27 NOTE — Patient Instructions (Signed)
Pt received his Testosterone injection this morning, dose given was 300 mg (1.5 ml),pt tolerated the injection well.

## 2017-09-10 ENCOUNTER — Ambulatory Visit (INDEPENDENT_AMBULATORY_CARE_PROVIDER_SITE_OTHER): Payer: BLUE CROSS/BLUE SHIELD | Admitting: Family Medicine

## 2017-09-10 DIAGNOSIS — E349 Endocrine disorder, unspecified: Secondary | ICD-10-CM | POA: Diagnosis not present

## 2017-09-10 DIAGNOSIS — J301 Allergic rhinitis due to pollen: Secondary | ICD-10-CM | POA: Diagnosis not present

## 2017-09-10 DIAGNOSIS — J3089 Other allergic rhinitis: Secondary | ICD-10-CM | POA: Diagnosis not present

## 2017-09-10 DIAGNOSIS — J3081 Allergic rhinitis due to animal (cat) (dog) hair and dander: Secondary | ICD-10-CM | POA: Diagnosis not present

## 2017-09-10 MED ORDER — TESTOSTERONE CYPIONATE 200 MG/ML IM SOLN
300.0000 mg | Freq: Once | INTRAMUSCULAR | Status: AC
  Administered 2017-09-10: 300 mg via INTRAMUSCULAR

## 2017-09-10 NOTE — Progress Notes (Signed)
Per orders of Dr. Burchette, injection of Testosterone 300 mg given by Aine Strycharz ANN. Patient tolerated injection well. 

## 2017-09-13 DIAGNOSIS — J3081 Allergic rhinitis due to animal (cat) (dog) hair and dander: Secondary | ICD-10-CM | POA: Diagnosis not present

## 2017-09-13 DIAGNOSIS — J301 Allergic rhinitis due to pollen: Secondary | ICD-10-CM | POA: Diagnosis not present

## 2017-09-13 DIAGNOSIS — J3089 Other allergic rhinitis: Secondary | ICD-10-CM | POA: Diagnosis not present

## 2017-09-15 ENCOUNTER — Other Ambulatory Visit: Payer: Self-pay | Admitting: Family Medicine

## 2017-09-15 DIAGNOSIS — J3089 Other allergic rhinitis: Secondary | ICD-10-CM | POA: Diagnosis not present

## 2017-09-15 DIAGNOSIS — J301 Allergic rhinitis due to pollen: Secondary | ICD-10-CM | POA: Diagnosis not present

## 2017-09-15 DIAGNOSIS — J3081 Allergic rhinitis due to animal (cat) (dog) hair and dander: Secondary | ICD-10-CM | POA: Diagnosis not present

## 2017-09-15 MED ORDER — TADALAFIL 20 MG PO TABS: ORAL_TABLET | ORAL | 1 refills | Status: DC

## 2017-09-15 MED ORDER — ZOLPIDEM TARTRATE 5 MG PO TABS: 5.0000 mg | ORAL_TABLET | Freq: Every day | ORAL | 5 refills | Status: DC

## 2017-09-15 MED ORDER — LOVASTATIN 20 MG PO TABS: 20.0000 mg | ORAL_TABLET | Freq: Every day | ORAL | 1 refills | Status: DC

## 2017-09-15 NOTE — Telephone Encounter (Signed)
Refill for 6 months. 

## 2017-09-21 DIAGNOSIS — J3089 Other allergic rhinitis: Secondary | ICD-10-CM | POA: Diagnosis not present

## 2017-09-21 DIAGNOSIS — J3081 Allergic rhinitis due to animal (cat) (dog) hair and dander: Secondary | ICD-10-CM | POA: Diagnosis not present

## 2017-09-21 DIAGNOSIS — J301 Allergic rhinitis due to pollen: Secondary | ICD-10-CM | POA: Diagnosis not present

## 2017-09-24 ENCOUNTER — Ambulatory Visit (INDEPENDENT_AMBULATORY_CARE_PROVIDER_SITE_OTHER): Payer: BLUE CROSS/BLUE SHIELD | Admitting: Family Medicine

## 2017-09-24 DIAGNOSIS — J301 Allergic rhinitis due to pollen: Secondary | ICD-10-CM | POA: Diagnosis not present

## 2017-09-24 DIAGNOSIS — J3089 Other allergic rhinitis: Secondary | ICD-10-CM | POA: Diagnosis not present

## 2017-09-24 DIAGNOSIS — E349 Endocrine disorder, unspecified: Secondary | ICD-10-CM

## 2017-09-24 DIAGNOSIS — J3081 Allergic rhinitis due to animal (cat) (dog) hair and dander: Secondary | ICD-10-CM | POA: Diagnosis not present

## 2017-09-24 MED ORDER — TESTOSTERONE CYPIONATE 200 MG/ML IM SOLN
300.0000 mg | INTRAMUSCULAR | Status: DC
  Administered 2017-09-24: 300 mg via INTRAMUSCULAR

## 2017-09-24 NOTE — Progress Notes (Signed)
Per orders of Dr. Caryl NeverBurchette, injection of testosterone given by Raj JanusAdkins, Misty. Patient tolerated injection well.

## 2017-09-27 ENCOUNTER — Encounter: Payer: Self-pay | Admitting: Family Medicine

## 2017-09-28 DIAGNOSIS — J3089 Other allergic rhinitis: Secondary | ICD-10-CM | POA: Diagnosis not present

## 2017-09-28 DIAGNOSIS — J3081 Allergic rhinitis due to animal (cat) (dog) hair and dander: Secondary | ICD-10-CM | POA: Diagnosis not present

## 2017-09-28 DIAGNOSIS — J301 Allergic rhinitis due to pollen: Secondary | ICD-10-CM | POA: Diagnosis not present

## 2017-09-28 MED ORDER — TADALAFIL 20 MG PO TABS: ORAL_TABLET | ORAL | 11 refills | Status: DC

## 2017-09-30 DIAGNOSIS — J301 Allergic rhinitis due to pollen: Secondary | ICD-10-CM | POA: Diagnosis not present

## 2017-10-08 ENCOUNTER — Ambulatory Visit (INDEPENDENT_AMBULATORY_CARE_PROVIDER_SITE_OTHER): Payer: BLUE CROSS/BLUE SHIELD

## 2017-10-08 DIAGNOSIS — J301 Allergic rhinitis due to pollen: Secondary | ICD-10-CM | POA: Diagnosis not present

## 2017-10-08 DIAGNOSIS — J3081 Allergic rhinitis due to animal (cat) (dog) hair and dander: Secondary | ICD-10-CM | POA: Diagnosis not present

## 2017-10-08 DIAGNOSIS — R7989 Other specified abnormal findings of blood chemistry: Secondary | ICD-10-CM | POA: Diagnosis not present

## 2017-10-08 DIAGNOSIS — J3089 Other allergic rhinitis: Secondary | ICD-10-CM | POA: Diagnosis not present

## 2017-10-08 MED ORDER — TESTOSTERONE CYPIONATE 200 MG/ML IM SOLN
300.0000 mg | INTRAMUSCULAR | Status: DC
  Administered 2017-10-08: 300 mg via INTRAMUSCULAR

## 2017-10-08 NOTE — Progress Notes (Signed)
Pt received his Testosterone injection this morning, pt tolerated the injection well.

## 2017-10-13 DIAGNOSIS — J3081 Allergic rhinitis due to animal (cat) (dog) hair and dander: Secondary | ICD-10-CM | POA: Diagnosis not present

## 2017-10-13 DIAGNOSIS — J301 Allergic rhinitis due to pollen: Secondary | ICD-10-CM | POA: Diagnosis not present

## 2017-10-13 DIAGNOSIS — J3089 Other allergic rhinitis: Secondary | ICD-10-CM | POA: Diagnosis not present

## 2017-10-22 ENCOUNTER — Ambulatory Visit (INDEPENDENT_AMBULATORY_CARE_PROVIDER_SITE_OTHER): Payer: BLUE CROSS/BLUE SHIELD | Admitting: Family Medicine

## 2017-10-22 DIAGNOSIS — J301 Allergic rhinitis due to pollen: Secondary | ICD-10-CM | POA: Diagnosis not present

## 2017-10-22 DIAGNOSIS — J3081 Allergic rhinitis due to animal (cat) (dog) hair and dander: Secondary | ICD-10-CM | POA: Diagnosis not present

## 2017-10-22 DIAGNOSIS — E349 Endocrine disorder, unspecified: Secondary | ICD-10-CM

## 2017-10-22 DIAGNOSIS — J3089 Other allergic rhinitis: Secondary | ICD-10-CM | POA: Diagnosis not present

## 2017-10-22 MED ORDER — TESTOSTERONE CYPIONATE 200 MG/ML IM SOLN
300.0000 mg | Freq: Once | INTRAMUSCULAR | Status: AC
  Administered 2017-10-22: 300 mg via INTRAMUSCULAR

## 2017-10-22 NOTE — Progress Notes (Signed)
Per orders of Dr. Salomon FickBanks injection of Testosterone 300 mg given by Aniceto BossNIMMONS, Philmore Lepore ANN. Patient tolerated injection well.

## 2017-10-25 DIAGNOSIS — B3749 Other urogenital candidiasis: Secondary | ICD-10-CM | POA: Diagnosis not present

## 2017-10-25 DIAGNOSIS — L2489 Irritant contact dermatitis due to other agents: Secondary | ICD-10-CM | POA: Diagnosis not present

## 2017-10-29 ENCOUNTER — Ambulatory Visit (INDEPENDENT_AMBULATORY_CARE_PROVIDER_SITE_OTHER): Payer: BLUE CROSS/BLUE SHIELD

## 2017-10-29 DIAGNOSIS — J3089 Other allergic rhinitis: Secondary | ICD-10-CM | POA: Diagnosis not present

## 2017-10-29 DIAGNOSIS — J3081 Allergic rhinitis due to animal (cat) (dog) hair and dander: Secondary | ICD-10-CM | POA: Diagnosis not present

## 2017-10-29 DIAGNOSIS — J301 Allergic rhinitis due to pollen: Secondary | ICD-10-CM | POA: Diagnosis not present

## 2017-10-29 DIAGNOSIS — E349 Endocrine disorder, unspecified: Secondary | ICD-10-CM

## 2017-10-29 MED ORDER — TESTOSTERONE CYPIONATE 200 MG/ML IM SOLN
300.0000 mg | INTRAMUSCULAR | Status: DC
  Administered 2017-10-29: 300 mg via INTRAMUSCULAR

## 2017-10-29 NOTE — Progress Notes (Signed)
Per orders of Dr.Burchette, injection of Testosterone 300 mg given by Carola RhineNancy N Kigotho. Patient tolerated injection well.

## 2017-11-07 ENCOUNTER — Other Ambulatory Visit: Payer: Self-pay | Admitting: Family Medicine

## 2017-11-08 DIAGNOSIS — J301 Allergic rhinitis due to pollen: Secondary | ICD-10-CM | POA: Diagnosis not present

## 2017-11-08 DIAGNOSIS — J3081 Allergic rhinitis due to animal (cat) (dog) hair and dander: Secondary | ICD-10-CM | POA: Diagnosis not present

## 2017-11-08 DIAGNOSIS — J3089 Other allergic rhinitis: Secondary | ICD-10-CM | POA: Diagnosis not present

## 2017-11-08 NOTE — Telephone Encounter (Signed)
Refill for 6 months. 

## 2017-11-08 NOTE — Telephone Encounter (Signed)
Last OV 08/16/17 Testosterone last filled 08/20/17 10mL with 0

## 2017-11-10 ENCOUNTER — Ambulatory Visit: Payer: BLUE CROSS/BLUE SHIELD | Admitting: Orthotics

## 2017-11-10 DIAGNOSIS — M779 Enthesopathy, unspecified: Secondary | ICD-10-CM

## 2017-11-10 NOTE — Progress Notes (Signed)
Patient not happy that f/o are coming apart for the second time.  Will send back to [facility] and if they come apart again we will change vendors for this patient.

## 2017-11-12 ENCOUNTER — Ambulatory Visit (INDEPENDENT_AMBULATORY_CARE_PROVIDER_SITE_OTHER): Payer: BLUE CROSS/BLUE SHIELD | Admitting: Family Medicine

## 2017-11-12 DIAGNOSIS — J301 Allergic rhinitis due to pollen: Secondary | ICD-10-CM | POA: Diagnosis not present

## 2017-11-12 DIAGNOSIS — J3089 Other allergic rhinitis: Secondary | ICD-10-CM | POA: Diagnosis not present

## 2017-11-12 DIAGNOSIS — E349 Endocrine disorder, unspecified: Secondary | ICD-10-CM

## 2017-11-12 DIAGNOSIS — B3749 Other urogenital candidiasis: Secondary | ICD-10-CM | POA: Diagnosis not present

## 2017-11-12 DIAGNOSIS — J3081 Allergic rhinitis due to animal (cat) (dog) hair and dander: Secondary | ICD-10-CM | POA: Diagnosis not present

## 2017-11-12 MED ORDER — TESTOSTERONE CYPIONATE 200 MG/ML IM SOLN
300.0000 mg | Freq: Once | INTRAMUSCULAR | Status: AC
  Administered 2017-11-12: 300 mg via INTRAMUSCULAR

## 2017-11-12 NOTE — Progress Notes (Signed)
Per orders of Dr. Burchette, injection of Testosterone 300 mg given by Kataleyah Carducci ANN. Patient tolerated injection well. 

## 2017-11-16 DIAGNOSIS — J3081 Allergic rhinitis due to animal (cat) (dog) hair and dander: Secondary | ICD-10-CM | POA: Diagnosis not present

## 2017-11-16 DIAGNOSIS — J3089 Other allergic rhinitis: Secondary | ICD-10-CM | POA: Diagnosis not present

## 2017-11-16 DIAGNOSIS — J301 Allergic rhinitis due to pollen: Secondary | ICD-10-CM | POA: Diagnosis not present

## 2017-11-26 ENCOUNTER — Ambulatory Visit (INDEPENDENT_AMBULATORY_CARE_PROVIDER_SITE_OTHER): Payer: BLUE CROSS/BLUE SHIELD | Admitting: Family Medicine

## 2017-11-26 ENCOUNTER — Other Ambulatory Visit: Payer: Self-pay | Admitting: Family Medicine

## 2017-11-26 DIAGNOSIS — E349 Endocrine disorder, unspecified: Secondary | ICD-10-CM

## 2017-11-26 DIAGNOSIS — J3081 Allergic rhinitis due to animal (cat) (dog) hair and dander: Secondary | ICD-10-CM | POA: Diagnosis not present

## 2017-11-26 DIAGNOSIS — J301 Allergic rhinitis due to pollen: Secondary | ICD-10-CM | POA: Diagnosis not present

## 2017-11-26 DIAGNOSIS — J3089 Other allergic rhinitis: Secondary | ICD-10-CM | POA: Diagnosis not present

## 2017-11-26 MED ORDER — TESTOSTERONE CYPIONATE 200 MG/ML IM SOLN
300.0000 mg | Freq: Once | INTRAMUSCULAR | Status: AC
  Administered 2017-11-26: 300 mg via INTRAMUSCULAR

## 2017-11-26 NOTE — Progress Notes (Signed)
Per orders of Dr. Burchette, injection of Testosterone 300 mg given by Lue [place] ANN. Patient tolerated injection well. 

## 2017-11-26 NOTE — Telephone Encounter (Signed)
08/16/17 last office visit and 08/20/17 last refill. Okay to refill?

## 2017-11-27 NOTE — Telephone Encounter (Signed)
Refills OK. 

## 2017-11-29 ENCOUNTER — Other Ambulatory Visit: Payer: Self-pay | Admitting: Family Medicine

## 2017-12-10 ENCOUNTER — Ambulatory Visit (INDEPENDENT_AMBULATORY_CARE_PROVIDER_SITE_OTHER): Payer: BLUE CROSS/BLUE SHIELD | Admitting: Family Medicine

## 2017-12-10 DIAGNOSIS — E349 Endocrine disorder, unspecified: Secondary | ICD-10-CM

## 2017-12-10 DIAGNOSIS — J301 Allergic rhinitis due to pollen: Secondary | ICD-10-CM | POA: Diagnosis not present

## 2017-12-10 DIAGNOSIS — J3081 Allergic rhinitis due to animal (cat) (dog) hair and dander: Secondary | ICD-10-CM | POA: Diagnosis not present

## 2017-12-10 DIAGNOSIS — J3089 Other allergic rhinitis: Secondary | ICD-10-CM | POA: Diagnosis not present

## 2017-12-10 MED ORDER — TESTOSTERONE CYPIONATE 200 MG/ML IM SOLN
300.0000 mg | Freq: Once | INTRAMUSCULAR | Status: AC
  Administered 2017-12-10: 300 mg via INTRAMUSCULAR

## 2017-12-10 NOTE — Progress Notes (Signed)
Per orders of Dr. Burchette, injection of Testosterone 300 mg given by NIMMONS, SYLVIA ANN. Patient tolerated injection well. 

## 2017-12-20 DIAGNOSIS — J301 Allergic rhinitis due to pollen: Secondary | ICD-10-CM | POA: Diagnosis not present

## 2017-12-20 DIAGNOSIS — J3081 Allergic rhinitis due to animal (cat) (dog) hair and dander: Secondary | ICD-10-CM | POA: Diagnosis not present

## 2017-12-20 DIAGNOSIS — J3089 Other allergic rhinitis: Secondary | ICD-10-CM | POA: Diagnosis not present

## 2017-12-23 ENCOUNTER — Other Ambulatory Visit: Payer: Self-pay | Admitting: Family Medicine

## 2017-12-24 ENCOUNTER — Ambulatory Visit (INDEPENDENT_AMBULATORY_CARE_PROVIDER_SITE_OTHER): Payer: BLUE CROSS/BLUE SHIELD | Admitting: *Deleted

## 2017-12-24 DIAGNOSIS — E349 Endocrine disorder, unspecified: Secondary | ICD-10-CM | POA: Diagnosis not present

## 2017-12-24 DIAGNOSIS — J301 Allergic rhinitis due to pollen: Secondary | ICD-10-CM | POA: Diagnosis not present

## 2017-12-24 DIAGNOSIS — J3081 Allergic rhinitis due to animal (cat) (dog) hair and dander: Secondary | ICD-10-CM | POA: Diagnosis not present

## 2017-12-24 DIAGNOSIS — J3089 Other allergic rhinitis: Secondary | ICD-10-CM | POA: Diagnosis not present

## 2017-12-24 MED ORDER — TESTOSTERONE CYPIONATE 200 MG/ML IM SOLN: 200.0000 mg | Freq: Once | INTRAMUSCULAR | Status: DC

## 2017-12-24 MED ORDER — TESTOSTERONE CYPIONATE 200 MG/ML IM SOLN
300.0000 mg | Freq: Once | INTRAMUSCULAR | Status: AC
  Administered 2017-12-24: 300 mg via INTRAMUSCULAR

## 2017-12-24 NOTE — Progress Notes (Signed)
Per orders of Dr. Caryl NeverBurchette, injection of testosterone 300 mg given by Starla Linkarolyn J Rollin Kotowski. Patient tolerated injection well.

## 2017-12-30 DIAGNOSIS — J301 Allergic rhinitis due to pollen: Secondary | ICD-10-CM | POA: Diagnosis not present

## 2017-12-31 DIAGNOSIS — J301 Allergic rhinitis due to pollen: Secondary | ICD-10-CM | POA: Diagnosis not present

## 2017-12-31 DIAGNOSIS — J3089 Other allergic rhinitis: Secondary | ICD-10-CM | POA: Diagnosis not present

## 2017-12-31 DIAGNOSIS — J3081 Allergic rhinitis due to animal (cat) (dog) hair and dander: Secondary | ICD-10-CM | POA: Diagnosis not present

## 2018-01-07 ENCOUNTER — Ambulatory Visit (INDEPENDENT_AMBULATORY_CARE_PROVIDER_SITE_OTHER): Payer: BLUE CROSS/BLUE SHIELD | Admitting: *Deleted

## 2018-01-07 DIAGNOSIS — E349 Endocrine disorder, unspecified: Secondary | ICD-10-CM

## 2018-01-07 DIAGNOSIS — J301 Allergic rhinitis due to pollen: Secondary | ICD-10-CM | POA: Diagnosis not present

## 2018-01-07 DIAGNOSIS — J3089 Other allergic rhinitis: Secondary | ICD-10-CM | POA: Diagnosis not present

## 2018-01-07 MED ORDER — TESTOSTERONE CYPIONATE 200 MG/ML IM SOLN
300.0000 mg | Freq: Once | INTRAMUSCULAR | Status: AC
  Administered 2018-01-07: 300 mg via INTRAMUSCULAR

## 2018-01-07 NOTE — Progress Notes (Signed)
Per orders of Dr. Caryl NeverBurchette, injection of testosterone 300 mg given by Starla Linkarolyn J Debbera Wolken. Patient tolerated injection well.

## 2018-01-12 DIAGNOSIS — J3081 Allergic rhinitis due to animal (cat) (dog) hair and dander: Secondary | ICD-10-CM | POA: Diagnosis not present

## 2018-01-12 DIAGNOSIS — J301 Allergic rhinitis due to pollen: Secondary | ICD-10-CM | POA: Diagnosis not present

## 2018-01-12 DIAGNOSIS — J3089 Other allergic rhinitis: Secondary | ICD-10-CM | POA: Diagnosis not present

## 2018-01-21 ENCOUNTER — Ambulatory Visit (INDEPENDENT_AMBULATORY_CARE_PROVIDER_SITE_OTHER): Payer: BLUE CROSS/BLUE SHIELD | Admitting: *Deleted

## 2018-01-21 DIAGNOSIS — R7989 Other specified abnormal findings of blood chemistry: Secondary | ICD-10-CM

## 2018-01-21 MED ORDER — TESTOSTERONE CYPIONATE 200 MG/ML IM SOLN
300.0000 mg | INTRAMUSCULAR | Status: DC
  Administered 2018-01-21 – 2018-06-10 (×2): 300 mg via INTRAMUSCULAR

## 2018-01-21 NOTE — Progress Notes (Cosign Needed Addendum)
Per orders of Dr. Caryl NeverBurchette, injection of testosterone 300 mg given by Nadara EatonAshtyn Green, CMA.  Patient tolerated injection well.

## 2018-01-24 DIAGNOSIS — J301 Allergic rhinitis due to pollen: Secondary | ICD-10-CM | POA: Diagnosis not present

## 2018-01-24 DIAGNOSIS — J3089 Other allergic rhinitis: Secondary | ICD-10-CM | POA: Diagnosis not present

## 2018-01-24 DIAGNOSIS — J3081 Allergic rhinitis due to animal (cat) (dog) hair and dander: Secondary | ICD-10-CM | POA: Diagnosis not present

## 2018-02-03 ENCOUNTER — Ambulatory Visit (INDEPENDENT_AMBULATORY_CARE_PROVIDER_SITE_OTHER): Payer: BLUE CROSS/BLUE SHIELD

## 2018-02-03 DIAGNOSIS — R7989 Other specified abnormal findings of blood chemistry: Secondary | ICD-10-CM

## 2018-02-03 DIAGNOSIS — J301 Allergic rhinitis due to pollen: Secondary | ICD-10-CM | POA: Diagnosis not present

## 2018-02-03 DIAGNOSIS — J3089 Other allergic rhinitis: Secondary | ICD-10-CM | POA: Diagnosis not present

## 2018-02-03 DIAGNOSIS — J3081 Allergic rhinitis due to animal (cat) (dog) hair and dander: Secondary | ICD-10-CM | POA: Diagnosis not present

## 2018-02-03 MED ORDER — TESTOSTERONE CYPIONATE 200 MG/ML IM SOLN
300.0000 mg | INTRAMUSCULAR | Status: DC
  Administered 2018-02-03 – 2018-06-24 (×2): 300 mg via INTRAMUSCULAR

## 2018-02-03 NOTE — Progress Notes (Addendum)
Per orders of Dr. Selena BattenKim in the absence of Dr. Caryl NeverBurchette injection of Testosterone 300mg  given by Solon AugustaLindsay  Yun Gutierrez. Patient tolerated injection well.

## 2018-02-08 DIAGNOSIS — J3089 Other allergic rhinitis: Secondary | ICD-10-CM | POA: Diagnosis not present

## 2018-02-08 DIAGNOSIS — J301 Allergic rhinitis due to pollen: Secondary | ICD-10-CM | POA: Diagnosis not present

## 2018-02-08 DIAGNOSIS — J3081 Allergic rhinitis due to animal (cat) (dog) hair and dander: Secondary | ICD-10-CM | POA: Diagnosis not present

## 2018-02-17 ENCOUNTER — Ambulatory Visit (INDEPENDENT_AMBULATORY_CARE_PROVIDER_SITE_OTHER): Payer: BLUE CROSS/BLUE SHIELD | Admitting: Family Medicine

## 2018-02-17 ENCOUNTER — Other Ambulatory Visit: Payer: Self-pay | Admitting: Family Medicine

## 2018-02-17 DIAGNOSIS — E349 Endocrine disorder, unspecified: Secondary | ICD-10-CM

## 2018-02-17 DIAGNOSIS — J301 Allergic rhinitis due to pollen: Secondary | ICD-10-CM | POA: Diagnosis not present

## 2018-02-17 DIAGNOSIS — J3081 Allergic rhinitis due to animal (cat) (dog) hair and dander: Secondary | ICD-10-CM | POA: Diagnosis not present

## 2018-02-17 DIAGNOSIS — J3089 Other allergic rhinitis: Secondary | ICD-10-CM | POA: Diagnosis not present

## 2018-02-17 MED ORDER — TESTOSTERONE CYPIONATE 200 MG/ML IM SOLN
300.0000 mg | Freq: Once | INTRAMUSCULAR | Status: AC
  Administered 2018-02-17: 300 mg via INTRAMUSCULAR

## 2018-02-17 NOTE — Progress Notes (Signed)
Per orders of Shirline Freesory Nafziger NP, injection of Testosterone 300 mg given by Aniceto BossNIMMONS, Kharma Sampsel ANN. Patient tolerated injection well.

## 2018-02-18 NOTE — Telephone Encounter (Signed)
Refill OK

## 2018-02-18 NOTE — Telephone Encounter (Signed)
Last refill 11/09/17 and last office visit 08/16/17.  Okay to fill?

## 2018-02-21 ENCOUNTER — Other Ambulatory Visit: Payer: Self-pay | Admitting: Family Medicine

## 2018-02-21 NOTE — Telephone Encounter (Signed)
Rx done. 

## 2018-03-02 DIAGNOSIS — J3089 Other allergic rhinitis: Secondary | ICD-10-CM | POA: Diagnosis not present

## 2018-03-02 DIAGNOSIS — J3081 Allergic rhinitis due to animal (cat) (dog) hair and dander: Secondary | ICD-10-CM | POA: Diagnosis not present

## 2018-03-02 DIAGNOSIS — J301 Allergic rhinitis due to pollen: Secondary | ICD-10-CM | POA: Diagnosis not present

## 2018-03-04 ENCOUNTER — Ambulatory Visit (INDEPENDENT_AMBULATORY_CARE_PROVIDER_SITE_OTHER): Payer: BLUE CROSS/BLUE SHIELD | Admitting: Family Medicine

## 2018-03-04 DIAGNOSIS — E349 Endocrine disorder, unspecified: Secondary | ICD-10-CM

## 2018-03-04 DIAGNOSIS — J3081 Allergic rhinitis due to animal (cat) (dog) hair and dander: Secondary | ICD-10-CM | POA: Diagnosis not present

## 2018-03-04 DIAGNOSIS — J301 Allergic rhinitis due to pollen: Secondary | ICD-10-CM | POA: Diagnosis not present

## 2018-03-04 DIAGNOSIS — J3089 Other allergic rhinitis: Secondary | ICD-10-CM | POA: Diagnosis not present

## 2018-03-04 MED ORDER — TESTOSTERONE CYPIONATE 200 MG/ML IM SOLN
300.0000 mg | Freq: Once | INTRAMUSCULAR | Status: AC
  Administered 2018-03-04: 300 mg via INTRAMUSCULAR

## 2018-03-04 NOTE — Progress Notes (Signed)
Per orders of Dr. Caryl NeverBurchette, injection of Testosterone 300 mg given by Aniceto BossNIMMONS, SYLVIA ANN. Patient tolerated injection well.

## 2018-03-08 DIAGNOSIS — J301 Allergic rhinitis due to pollen: Secondary | ICD-10-CM | POA: Diagnosis not present

## 2018-03-08 DIAGNOSIS — J3081 Allergic rhinitis due to animal (cat) (dog) hair and dander: Secondary | ICD-10-CM | POA: Diagnosis not present

## 2018-03-08 DIAGNOSIS — J3089 Other allergic rhinitis: Secondary | ICD-10-CM | POA: Diagnosis not present

## 2018-03-10 DIAGNOSIS — J301 Allergic rhinitis due to pollen: Secondary | ICD-10-CM | POA: Diagnosis not present

## 2018-03-10 DIAGNOSIS — J3081 Allergic rhinitis due to animal (cat) (dog) hair and dander: Secondary | ICD-10-CM | POA: Diagnosis not present

## 2018-03-10 DIAGNOSIS — J3089 Other allergic rhinitis: Secondary | ICD-10-CM | POA: Diagnosis not present

## 2018-03-16 ENCOUNTER — Ambulatory Visit (INDEPENDENT_AMBULATORY_CARE_PROVIDER_SITE_OTHER): Payer: BLUE CROSS/BLUE SHIELD | Admitting: Family Medicine

## 2018-03-16 DIAGNOSIS — J3081 Allergic rhinitis due to animal (cat) (dog) hair and dander: Secondary | ICD-10-CM | POA: Diagnosis not present

## 2018-03-16 DIAGNOSIS — E349 Endocrine disorder, unspecified: Secondary | ICD-10-CM | POA: Diagnosis not present

## 2018-03-16 DIAGNOSIS — J3089 Other allergic rhinitis: Secondary | ICD-10-CM | POA: Diagnosis not present

## 2018-03-16 DIAGNOSIS — J301 Allergic rhinitis due to pollen: Secondary | ICD-10-CM | POA: Diagnosis not present

## 2018-03-16 MED ORDER — TESTOSTERONE CYPIONATE 200 MG/ML IM SOLN
300.0000 mg | Freq: Once | INTRAMUSCULAR | Status: AC
  Administered 2018-03-16: 300 mg via INTRAMUSCULAR

## 2018-03-16 NOTE — Progress Notes (Signed)
Per orders of Dr. Caryl NeverBurchette, injection of Testosterone 300 mg given by Aniceto BossNIMMONS, Taylon Louison ANN. Patient tolerated injection well.

## 2018-03-22 DIAGNOSIS — J3081 Allergic rhinitis due to animal (cat) (dog) hair and dander: Secondary | ICD-10-CM | POA: Diagnosis not present

## 2018-03-22 DIAGNOSIS — J301 Allergic rhinitis due to pollen: Secondary | ICD-10-CM | POA: Diagnosis not present

## 2018-03-22 DIAGNOSIS — J3089 Other allergic rhinitis: Secondary | ICD-10-CM | POA: Diagnosis not present

## 2018-03-31 ENCOUNTER — Ambulatory Visit (INDEPENDENT_AMBULATORY_CARE_PROVIDER_SITE_OTHER): Payer: BLUE CROSS/BLUE SHIELD

## 2018-03-31 DIAGNOSIS — J3089 Other allergic rhinitis: Secondary | ICD-10-CM | POA: Diagnosis not present

## 2018-03-31 DIAGNOSIS — E349 Endocrine disorder, unspecified: Secondary | ICD-10-CM

## 2018-03-31 DIAGNOSIS — J301 Allergic rhinitis due to pollen: Secondary | ICD-10-CM | POA: Diagnosis not present

## 2018-03-31 DIAGNOSIS — J3081 Allergic rhinitis due to animal (cat) (dog) hair and dander: Secondary | ICD-10-CM | POA: Diagnosis not present

## 2018-03-31 MED ORDER — TESTOSTERONE CYPIONATE 200 MG/ML IM SOLN
300.0000 mg | Freq: Once | INTRAMUSCULAR | Status: AC
  Administered 2018-03-31: 300 mg via INTRAMUSCULAR

## 2018-03-31 NOTE — Progress Notes (Signed)
Per orders of Dr. Caryl NeverBurchette, injection of 300mg  given by Solon AugustaLindsay  Kayde Atkerson. Patient tolerated injection well.  Dr. Selena BattenKim please review in the absence of Dr. Early OsmondBurcette.

## 2018-04-02 ENCOUNTER — Other Ambulatory Visit: Payer: Self-pay | Admitting: Family Medicine

## 2018-04-08 DIAGNOSIS — J3081 Allergic rhinitis due to animal (cat) (dog) hair and dander: Secondary | ICD-10-CM | POA: Diagnosis not present

## 2018-04-08 DIAGNOSIS — J301 Allergic rhinitis due to pollen: Secondary | ICD-10-CM | POA: Diagnosis not present

## 2018-04-08 DIAGNOSIS — J3089 Other allergic rhinitis: Secondary | ICD-10-CM | POA: Diagnosis not present

## 2018-04-12 ENCOUNTER — Ambulatory Visit (INDEPENDENT_AMBULATORY_CARE_PROVIDER_SITE_OTHER): Payer: BLUE CROSS/BLUE SHIELD | Admitting: *Deleted

## 2018-04-12 DIAGNOSIS — J301 Allergic rhinitis due to pollen: Secondary | ICD-10-CM | POA: Diagnosis not present

## 2018-04-12 DIAGNOSIS — J3081 Allergic rhinitis due to animal (cat) (dog) hair and dander: Secondary | ICD-10-CM | POA: Diagnosis not present

## 2018-04-12 DIAGNOSIS — E349 Endocrine disorder, unspecified: Secondary | ICD-10-CM | POA: Diagnosis not present

## 2018-04-12 DIAGNOSIS — R7989 Other specified abnormal findings of blood chemistry: Secondary | ICD-10-CM | POA: Diagnosis not present

## 2018-04-12 DIAGNOSIS — J3089 Other allergic rhinitis: Secondary | ICD-10-CM | POA: Diagnosis not present

## 2018-04-12 MED ORDER — TESTOSTERONE CYPIONATE 200 MG/ML IM SOLN
300.0000 mg | Freq: Once | INTRAMUSCULAR | Status: AC
  Administered 2018-04-12: 300 mg via INTRAMUSCULAR

## 2018-04-12 NOTE — Progress Notes (Addendum)
Per orders of Dr. Caryl NeverBurchette, injection of testosterone  given by Kern Reapachel Berry Godsey. Patient tolerated injection well. Patient supplied his own Testosterone Cypionate. Kristian CoveyBruce W Burchette MD  [facility]

## 2018-04-18 DIAGNOSIS — J301 Allergic rhinitis due to pollen: Secondary | ICD-10-CM | POA: Diagnosis not present

## 2018-04-18 DIAGNOSIS — J3081 Allergic rhinitis due to animal (cat) (dog) hair and dander: Secondary | ICD-10-CM | POA: Diagnosis not present

## 2018-04-18 DIAGNOSIS — J3089 Other allergic rhinitis: Secondary | ICD-10-CM | POA: Diagnosis not present

## 2018-04-24 ENCOUNTER — Other Ambulatory Visit: Payer: Self-pay | Admitting: Family Medicine

## 2018-04-26 ENCOUNTER — Ambulatory Visit (INDEPENDENT_AMBULATORY_CARE_PROVIDER_SITE_OTHER): Payer: BLUE CROSS/BLUE SHIELD

## 2018-04-26 DIAGNOSIS — J3089 Other allergic rhinitis: Secondary | ICD-10-CM | POA: Diagnosis not present

## 2018-04-26 DIAGNOSIS — J301 Allergic rhinitis due to pollen: Secondary | ICD-10-CM | POA: Diagnosis not present

## 2018-04-26 DIAGNOSIS — R7989 Other specified abnormal findings of blood chemistry: Secondary | ICD-10-CM | POA: Diagnosis not present

## 2018-04-26 DIAGNOSIS — J3081 Allergic rhinitis due to animal (cat) (dog) hair and dander: Secondary | ICD-10-CM | POA: Diagnosis not present

## 2018-04-26 MED ORDER — TESTOSTERONE CYPIONATE 200 MG/ML IM SOLN: INTRAMUSCULAR | 0 refills | Status: DC

## 2018-04-26 NOTE — Progress Notes (Signed)
Per orders of Dr. Caryl Never, injection of Testosterone 300mg  given by Solon Augusta. Patient tolerated injection well.

## 2018-05-02 DIAGNOSIS — J3081 Allergic rhinitis due to animal (cat) (dog) hair and dander: Secondary | ICD-10-CM | POA: Diagnosis not present

## 2018-05-02 DIAGNOSIS — J301 Allergic rhinitis due to pollen: Secondary | ICD-10-CM | POA: Diagnosis not present

## 2018-05-02 DIAGNOSIS — J3089 Other allergic rhinitis: Secondary | ICD-10-CM | POA: Diagnosis not present

## 2018-05-11 ENCOUNTER — Ambulatory Visit: Payer: BLUE CROSS/BLUE SHIELD | Admitting: Family Medicine

## 2018-05-11 DIAGNOSIS — J3081 Allergic rhinitis due to animal (cat) (dog) hair and dander: Secondary | ICD-10-CM | POA: Diagnosis not present

## 2018-05-11 DIAGNOSIS — J3089 Other allergic rhinitis: Secondary | ICD-10-CM | POA: Diagnosis not present

## 2018-05-11 DIAGNOSIS — J301 Allergic rhinitis due to pollen: Secondary | ICD-10-CM | POA: Diagnosis not present

## 2018-05-11 DIAGNOSIS — R7989 Other specified abnormal findings of blood chemistry: Secondary | ICD-10-CM

## 2018-05-11 MED ORDER — TESTOSTERONE CYPIONATE 200 MG/ML IM SOLN: 300.0000 mg | Freq: Once | INTRAMUSCULAR | 0 refills | Status: DC

## 2018-05-19 DIAGNOSIS — J3089 Other allergic rhinitis: Secondary | ICD-10-CM | POA: Diagnosis not present

## 2018-05-19 DIAGNOSIS — J3081 Allergic rhinitis due to animal (cat) (dog) hair and dander: Secondary | ICD-10-CM | POA: Diagnosis not present

## 2018-05-19 DIAGNOSIS — J301 Allergic rhinitis due to pollen: Secondary | ICD-10-CM | POA: Diagnosis not present

## 2018-05-27 ENCOUNTER — Ambulatory Visit (INDEPENDENT_AMBULATORY_CARE_PROVIDER_SITE_OTHER): Payer: BLUE CROSS/BLUE SHIELD | Admitting: *Deleted

## 2018-05-27 ENCOUNTER — Other Ambulatory Visit: Payer: Self-pay | Admitting: Family Medicine

## 2018-05-27 DIAGNOSIS — R7989 Other specified abnormal findings of blood chemistry: Secondary | ICD-10-CM

## 2018-05-27 DIAGNOSIS — J3081 Allergic rhinitis due to animal (cat) (dog) hair and dander: Secondary | ICD-10-CM | POA: Diagnosis not present

## 2018-05-27 DIAGNOSIS — J3089 Other allergic rhinitis: Secondary | ICD-10-CM | POA: Diagnosis not present

## 2018-05-27 DIAGNOSIS — J301 Allergic rhinitis due to pollen: Secondary | ICD-10-CM | POA: Diagnosis not present

## 2018-05-27 MED ORDER — TESTOSTERONE CYPIONATE 200 MG/ML IM SOLN
300.0000 mg | Freq: Once | INTRAMUSCULAR | Status: AC
  Administered 2018-05-27: 300 mg via INTRAMUSCULAR

## 2018-05-27 NOTE — Progress Notes (Signed)
Per orders of Dr. Burchette, injection of Testosterone 300mg given by ,  A. Patient tolerated injection well. 

## 2018-05-27 NOTE — Telephone Encounter (Signed)
Refill once.  Will need follow up labs/visit by February.

## 2018-05-27 NOTE — Telephone Encounter (Signed)
Last OV 05/11/18, No future OV  Last filled 05/11/18, 10 mL with 0 refills

## 2018-06-02 ENCOUNTER — Encounter: Payer: Self-pay | Admitting: Family Medicine

## 2018-06-03 ENCOUNTER — Other Ambulatory Visit: Payer: Self-pay

## 2018-06-03 MED ORDER — CYCLOBENZAPRINE HCL 10 MG PO TABS: 10.0000 mg | ORAL_TABLET | Freq: Three times a day (TID) | ORAL | 0 refills | Status: DC | PRN

## 2018-06-06 DIAGNOSIS — J301 Allergic rhinitis due to pollen: Secondary | ICD-10-CM | POA: Diagnosis not present

## 2018-06-06 DIAGNOSIS — J3081 Allergic rhinitis due to animal (cat) (dog) hair and dander: Secondary | ICD-10-CM | POA: Diagnosis not present

## 2018-06-06 DIAGNOSIS — J3089 Other allergic rhinitis: Secondary | ICD-10-CM | POA: Diagnosis not present

## 2018-06-07 DIAGNOSIS — J301 Allergic rhinitis due to pollen: Secondary | ICD-10-CM | POA: Diagnosis not present

## 2018-06-08 DIAGNOSIS — J3081 Allergic rhinitis due to animal (cat) (dog) hair and dander: Secondary | ICD-10-CM | POA: Diagnosis not present

## 2018-06-08 DIAGNOSIS — J3089 Other allergic rhinitis: Secondary | ICD-10-CM | POA: Diagnosis not present

## 2018-06-10 ENCOUNTER — Ambulatory Visit (INDEPENDENT_AMBULATORY_CARE_PROVIDER_SITE_OTHER): Payer: BLUE CROSS/BLUE SHIELD | Admitting: *Deleted

## 2018-06-10 DIAGNOSIS — R7989 Other specified abnormal findings of blood chemistry: Secondary | ICD-10-CM | POA: Diagnosis not present

## 2018-06-10 DIAGNOSIS — E349 Endocrine disorder, unspecified: Secondary | ICD-10-CM

## 2018-06-10 MED ORDER — TESTOSTERONE CYPIONATE 200 MG/ML IM SOLN: 200.0000 mg | Freq: Once | INTRAMUSCULAR | 0 refills | Status: DC

## 2018-06-14 DIAGNOSIS — J3081 Allergic rhinitis due to animal (cat) (dog) hair and dander: Secondary | ICD-10-CM | POA: Diagnosis not present

## 2018-06-14 DIAGNOSIS — J3089 Other allergic rhinitis: Secondary | ICD-10-CM | POA: Diagnosis not present

## 2018-06-14 DIAGNOSIS — J301 Allergic rhinitis due to pollen: Secondary | ICD-10-CM | POA: Diagnosis not present

## 2018-06-22 ENCOUNTER — Other Ambulatory Visit: Payer: Self-pay | Admitting: Family Medicine

## 2018-06-23 ENCOUNTER — Ambulatory Visit (INDEPENDENT_AMBULATORY_CARE_PROVIDER_SITE_OTHER): Payer: BLUE CROSS/BLUE SHIELD

## 2018-06-23 DIAGNOSIS — J3089 Other allergic rhinitis: Secondary | ICD-10-CM | POA: Diagnosis not present

## 2018-06-23 DIAGNOSIS — J301 Allergic rhinitis due to pollen: Secondary | ICD-10-CM | POA: Diagnosis not present

## 2018-06-23 DIAGNOSIS — J3081 Allergic rhinitis due to animal (cat) (dog) hair and dander: Secondary | ICD-10-CM | POA: Diagnosis not present

## 2018-06-23 DIAGNOSIS — E349 Endocrine disorder, unspecified: Secondary | ICD-10-CM

## 2018-06-23 MED ORDER — TESTOSTERONE CYPIONATE 200 MG/ML IM SOLN: 300.0000 mg | INTRAMUSCULAR | Status: DC

## 2018-06-24 DIAGNOSIS — R7989 Other specified abnormal findings of blood chemistry: Secondary | ICD-10-CM

## 2018-06-27 ENCOUNTER — Other Ambulatory Visit: Payer: Self-pay | Admitting: Family Medicine

## 2018-06-27 NOTE — Telephone Encounter (Signed)
He should definitely not be using the topical also. I am not sure why this request came through.

## 2018-06-27 NOTE — Telephone Encounter (Signed)
I see this patient is getting the injections. Does he use the gel for back up? Please advise if okay to decline if he is not also using the gel.

## 2018-06-28 DIAGNOSIS — J301 Allergic rhinitis due to pollen: Secondary | ICD-10-CM | POA: Diagnosis not present

## 2018-06-28 DIAGNOSIS — J3089 Other allergic rhinitis: Secondary | ICD-10-CM | POA: Diagnosis not present

## 2018-06-28 DIAGNOSIS — J3081 Allergic rhinitis due to animal (cat) (dog) hair and dander: Secondary | ICD-10-CM | POA: Diagnosis not present

## 2018-07-05 NOTE — Progress Notes (Signed)
Patient supplied his testosterone cypionate. The office does not stock testosterone.

## 2018-07-06 ENCOUNTER — Encounter: Payer: Self-pay | Admitting: Family Medicine

## 2018-07-06 ENCOUNTER — Ambulatory Visit (INDEPENDENT_AMBULATORY_CARE_PROVIDER_SITE_OTHER): Payer: BLUE CROSS/BLUE SHIELD | Admitting: *Deleted

## 2018-07-06 DIAGNOSIS — E349 Endocrine disorder, unspecified: Secondary | ICD-10-CM

## 2018-07-06 DIAGNOSIS — J3081 Allergic rhinitis due to animal (cat) (dog) hair and dander: Secondary | ICD-10-CM | POA: Diagnosis not present

## 2018-07-06 DIAGNOSIS — J301 Allergic rhinitis due to pollen: Secondary | ICD-10-CM | POA: Diagnosis not present

## 2018-07-06 DIAGNOSIS — J3089 Other allergic rhinitis: Secondary | ICD-10-CM | POA: Diagnosis not present

## 2018-07-06 MED ORDER — TESTOSTERONE CYPIONATE 200 MG/ML IM SOLN
300.0000 mg | Freq: Once | INTRAMUSCULAR | Status: AC
  Administered 2018-07-06: 300 mg via INTRAMUSCULAR

## 2018-07-06 NOTE — Progress Notes (Signed)
Per orders of Dr. Caryl NeverBurchette, injection of Testosterone 300mg  given by Johnella MoloneyFunderburk, Kenneth A. Patient tolerated injection well.

## 2018-07-11 ENCOUNTER — Other Ambulatory Visit: Payer: Self-pay

## 2018-07-11 ENCOUNTER — Ambulatory Visit: Payer: BLUE CROSS/BLUE SHIELD | Admitting: Family Medicine

## 2018-07-11 ENCOUNTER — Encounter: Payer: Self-pay | Admitting: Family Medicine

## 2018-07-11 VITALS — BP 108/64 | HR 75 | Temp 98.4°F | Wt 176.3 lb

## 2018-07-11 DIAGNOSIS — N529 Male erectile dysfunction, unspecified: Secondary | ICD-10-CM | POA: Diagnosis not present

## 2018-07-11 DIAGNOSIS — R7989 Other specified abnormal findings of blood chemistry: Secondary | ICD-10-CM | POA: Diagnosis not present

## 2018-07-11 DIAGNOSIS — J301 Allergic rhinitis due to pollen: Secondary | ICD-10-CM | POA: Diagnosis not present

## 2018-07-11 DIAGNOSIS — J3081 Allergic rhinitis due to animal (cat) (dog) hair and dander: Secondary | ICD-10-CM | POA: Diagnosis not present

## 2018-07-11 DIAGNOSIS — J3089 Other allergic rhinitis: Secondary | ICD-10-CM | POA: Diagnosis not present

## 2018-07-11 MED ORDER — SILDENAFIL CITRATE 50 MG PO TABS: ORAL_TABLET | ORAL | 5 refills | Status: DC

## 2018-07-11 NOTE — Patient Instructions (Signed)
Testosterone Replacement Therapy    Testosterone replacement therapy (TRT) is used to treat men who have a low testosterone level (hypogonadism). Testosterone is a male hormone that is produced in the testicles. It is responsible for typically male characteristics and for maintaining a man's sex drive and the ability to get an erection. Testosterone also supports bone and muscle health. TRT can be a gel, liquid, or patch that you put on your skin. It can also be in the form of a tablet or an injection. In some cases, your health care provider may insert long-acting pellets under your skin.  In most men, the level of testosterone starts to decline gradually after age 45. Low testosterone can also be caused by certain medical conditions, medicines, and obesity. Your health care provider can diagnose hypogonadism with at least two blood tests that are done early in the morning.  Low testosterone may not need to be treated. TRT is usually a choice that you make with your health care provider. Your health care provider may recommend TRT if you have low testosterone that is causing symptoms, such as:  · Low sex drive.  · Erection problems.  · Breast enlargement.  · Loss of body hair.  · Weak muscles or bones.  · Shrinking testicles.  · Increased body fat.  · Low energy.  · Hot flashes.  · Depression.  · Decreased work performance.  TRT is a lifetime treatment. If you stop treatment, your testosterone will drop, and your symptoms may return.  What are the risks?  Testosterone replacement therapy may have side effects, including:  · Lower sperm count.  · Skin irritation at the application or injection site.  · Mouth irritation if you take an oral tablet.  · Acne.  · Swelling of your legs or feet.  · Tender breasts.  · Dizziness.  · Sleep disturbance.  · Mood swings.  · Possible increased risk of stroke or heart attack.  Testosterone replacement therapy may also increase your risk for prostate cancer or male breast cancer.  You should not use TRT if you have either of those conditions. Your health care provider also may not recommend TRT if:  · You are suspected of having prostate cancer.  · You want to father a child.  · You have a high number of red blood cells.  · You have untreated sleep apnea.  · You have a very large prostate.  Supplies needed:  · Your health care provider will prescribe the testosterone gel, solution, or medicine that you need. If your health care provider teaches you to do self-injections at home, you will also need:  ? Your medicine vial.  ? Disposable needles and syringes.  ? Alcohol swabs.  ? A needle disposal container.  ? Adhesive bandages.  How to use testosterone replacement therapy  Your health care provider will help you find the TRT option that will work best for you based on your preference, the side effects, and the cost. You may:  · Rub testosterone gel on your upper arm or shoulder every day after a shower. This is the most common type of TRT. Do not let women or children come in contact with the gel.  · Apply a testosterone solution under your arms once each day.  · Place a testosterone patch on your skin once each day.  · Dissolve a testosterone tablet in your mouth twice each day.  · Have a testosterone pellet inserted under your skin by your health care   provider. This will be replaced every 3-6 months.  · Use testosterone nasal spray three times each day.  · Get testosterone injections. For some types of testosterone, your health care provider will give you this injection. With other types of testosterone, you may be taught to give injections to yourself. The frequency of injections may vary based on the type of testosterone that you receive.  Follow these instructions at home:  · Take over-the-counter and prescription medicines only as told by your health care provider.  · Lose weight if you are overweight. Ask your health care provider to help you start a healthy diet and exercise program to  reach and maintain a healthy weight.  · Work with your health care provider to treat other medical conditions that may lower your testosterone. These include obesity, high blood pressure, high cholesterol, diabetes, liver disease, kidney disease, and sleep apnea.  · Keep all follow-up visits as told by your health care provider. This is important.  General recommendations  · Discuss all risks and benefits with your health care provider before starting therapy.  · Work with your health care provider to check your prostate health and do blood testing before you start therapy.  · Do not use any testosterone replacement therapies that are not prescribed by your health care provider or not approved for use in the [place]  · Do not use TRT for bodybuilding or to improve sexual performance. TRT should be used only to treat symptoms of low testosterone.  · Return for all repeat prostate checks and blood tests during therapy, as told by your health care provider.  Where to find more information  Learn more about testosterone replacement therapy from:  · American Urological Foundation: www.urologyhealth.org/urologic-conditions/low-testosterone-(hypogonadism)  · Endocrine Society: www.hormone.org/diseases-and-conditions/mens-health/hypogonadism  Contact a health care provider if:  · You have side effects from your testosterone replacement therapy.  · You continue to have symptoms of low testosterone during treatment.  · You develop new symptoms during treatment.  Summary  · Testosterone replacement therapy is only for men who have low testosterone as determined by blood testing and who have symptoms of low testosterone.  · Testosterone replacement therapy should be prescribed only by a health care provider and should be used under the supervision of a health care provider.  · You may not be able to take testosterone if you have certain medical conditions, including prostate cancer, male breast cancer, or heart  disease.  · Testosterone replacement therapy may have side effects and may make some medical conditions worse.  · Talk with your health care provider about all the risks and benefits before you start therapy.  This information is not intended to replace advice given to you by your health care provider. Make sure you discuss any questions you have with your health care provider.  Document Released: 03/26/2016 Document Revised: 03/26/2016 Document Reviewed: 03/26/2016  Elsevier Interactive Patient Education © 2019 Elsevier Inc.

## 2018-07-11 NOTE — Progress Notes (Signed)
Subjective:     Patient ID: Kenneth Knapp, male   DOB: [birth year], 57 y.o.   MRN: [id number]  HPI Patient is here to discuss low testosterone.  We were doing some refills and had noticed that he was taking injections plus topical.  His last testosterone levels over 400 last February.  He has struggled to get levels up in the past.  Is getting 300 mg injectable every 14 days.  Still has some fatigue.  He had PSA and CBC last February which were normal range.  No history of secondary polycythemia.  We had recommended that he discontinue topical and get follow-up levels to see where we stand.  Hopefully with simplification we can get him on 1 mode of testosterone replacement  History of erectile dysfunction.  He has had issues with headaches with Cialis.  He has done better with Viagra and requesting prescription for that.  No history of nitroglycerin use.  Past Medical History:  Diagnosis Date  . ALLERGIC RHINITIS 08/03/2007  . BENIGN PROSTATIC HYPERTROPHY 08/03/2007  . Chronic low back pain   . H/O removal of cyst 2018   Lower Back  . HYPERLIPIDEMIA 08/03/2007  . TESTICULAR HYPOFUNCTION 12/04/2009   Past Surgical History:  Procedure Laterality Date  . BACK SURGERY  2002  . MASS EXCISION Left 06/08/2017   Procedure: EXCISION CYST BUTTOCK;  Surgeon: Harriette Bouillonornett, Thomas, MD;  Location: [facility];  Service: General;  Laterality: Left;  . WISDOM TOOTH EXTRACTION      reports that he has never smoked. He has never used smokeless tobacco. He reports current alcohol use. He reports that he does not use drugs. family history includes ALS in his mother; Arthritis in his mother; Colon cancer in his paternal grandmother; Heart disease in his father and paternal grandfather; Stroke (age of onset: 5873) in his father. No Known Allergies   Review of Systems  Constitutional: Negative for fatigue.  Eyes: Negative for visual disturbance.  Respiratory: Negative for cough, chest tightness and  shortness of breath.   Cardiovascular: Negative for chest pain, palpitations and leg swelling.  Neurological: Negative for dizziness, syncope, weakness, light-headedness and headaches.       Objective:   Physical Exam Constitutional:      Appearance: He is well-developed.  HENT:     Right Ear: External ear normal.     Left Ear: External ear normal.  Eyes:     Pupils: Pupils are equal, round, and reactive to light.  Neck:     Musculoskeletal: Neck supple.     Thyroid: No thyromegaly.  Cardiovascular:     Rate and Rhythm: Normal rate and regular rhythm.  Pulmonary:     Effort: Pulmonary effort is normal. No respiratory distress.     Breath sounds: Normal breath sounds. No wheezing or rales.  Neurological:     Mental Status: He is alert and oriented to person, place, and time.        Assessment:     #1 low testosterone.  Patient has required higher doses of testosterone to maintain adequate levels in the past.  #2 erectile dysfunction.  Reported headaches with Cialis    Plan:     -Discontinue Cialis and refilled Viagra for as needed use -He was instructed to discontinue topical AndroGel recently.  We plan to get follow-up testosterone levels early January off topical and on injectable only.  We will look at possible adjustment of his injectable dosage if not further to goal at that time -  We will also get follow-up CBC and PSA along with total testosterone when he comes back in January for labs  Kristian CoveyBruce W Burchette MD [facility]

## 2018-07-19 DIAGNOSIS — J301 Allergic rhinitis due to pollen: Secondary | ICD-10-CM | POA: Diagnosis not present

## 2018-07-19 DIAGNOSIS — J3089 Other allergic rhinitis: Secondary | ICD-10-CM | POA: Diagnosis not present

## 2018-07-19 DIAGNOSIS — J3081 Allergic rhinitis due to animal (cat) (dog) hair and dander: Secondary | ICD-10-CM | POA: Diagnosis not present

## 2018-07-21 ENCOUNTER — Ambulatory Visit: Payer: BLUE CROSS/BLUE SHIELD

## 2018-07-25 DIAGNOSIS — M79604 Pain in right leg: Secondary | ICD-10-CM | POA: Diagnosis not present

## 2018-07-25 DIAGNOSIS — M79661 Pain in right lower leg: Secondary | ICD-10-CM | POA: Insufficient documentation

## 2018-08-04 ENCOUNTER — Other Ambulatory Visit (INDEPENDENT_AMBULATORY_CARE_PROVIDER_SITE_OTHER): Payer: BLUE CROSS/BLUE SHIELD | Admitting: *Deleted

## 2018-08-04 ENCOUNTER — Ambulatory Visit: Payer: BLUE CROSS/BLUE SHIELD | Admitting: *Deleted

## 2018-08-04 DIAGNOSIS — R7989 Other specified abnormal findings of blood chemistry: Secondary | ICD-10-CM | POA: Diagnosis not present

## 2018-08-04 DIAGNOSIS — J301 Allergic rhinitis due to pollen: Secondary | ICD-10-CM | POA: Diagnosis not present

## 2018-08-04 DIAGNOSIS — J3089 Other allergic rhinitis: Secondary | ICD-10-CM | POA: Diagnosis not present

## 2018-08-04 DIAGNOSIS — J3081 Allergic rhinitis due to animal (cat) (dog) hair and dander: Secondary | ICD-10-CM | POA: Diagnosis not present

## 2018-08-04 MED ORDER — TESTOSTERONE CYPIONATE 200 MG/ML IM SOLN: 200.0000 mg | Freq: Once | INTRAMUSCULAR | 0 refills | Status: DC

## 2018-08-05 LAB — CBC WITH DIFFERENTIAL/PLATELET
Basophils Absolute: 0.1 10*3/uL (ref 0.0–0.1)
Basophils Relative: 1.1 % (ref 0.0–3.0)
Eosinophils Absolute: 0.2 10*3/uL (ref 0.0–0.7)
Eosinophils Relative: 2.8 % (ref 0.0–5.0)
HCT: 48.5 % (ref 39.0–52.0)
Hemoglobin: 16.6 g/dL (ref 13.0–17.0)
LYMPHS ABS: 2 10*3/uL (ref 0.7–4.0)
Lymphocytes Relative: 28.2 % (ref 12.0–46.0)
MCHC: 34.3 g/dL (ref 30.0–36.0)
MCV: 94.5 fl (ref 78.0–100.0)
Monocytes Absolute: 0.6 10*3/uL (ref 0.1–1.0)
Monocytes Relative: 8.5 % (ref 3.0–12.0)
NEUTROS PCT: 59.4 % (ref 43.0–77.0)
Neutro Abs: 4.3 10*3/uL (ref 1.4–7.7)
Platelets: 235 10*3/uL (ref 150.0–400.0)
RBC: 5.13 Mil/uL (ref 4.22–5.81)
RDW: 12.5 % (ref 11.5–15.5)
WBC: 7.2 10*3/uL (ref 4.0–10.5)

## 2018-08-05 LAB — TESTOSTERONE: Testosterone: 138.17 ng/dL — ABNORMAL LOW (ref 300.00–890.00)

## 2018-08-05 LAB — PSA: PSA: 1.84 ng/mL (ref 0.10–4.00)

## 2018-08-05 MED ORDER — TESTOSTERONE CYPIONATE 200 MG/ML IM SOLN
300.0000 mg | INTRAMUSCULAR | Status: DC
  Administered 2018-08-05 – 2018-09-02 (×2): 300 mg via INTRAMUSCULAR

## 2018-08-08 ENCOUNTER — Encounter: Payer: Self-pay | Admitting: Family Medicine

## 2018-08-10 ENCOUNTER — Other Ambulatory Visit: Payer: Self-pay

## 2018-08-10 DIAGNOSIS — R7989 Other specified abnormal findings of blood chemistry: Secondary | ICD-10-CM

## 2018-08-17 ENCOUNTER — Ambulatory Visit (INDEPENDENT_AMBULATORY_CARE_PROVIDER_SITE_OTHER): Payer: BLUE CROSS/BLUE SHIELD

## 2018-08-17 DIAGNOSIS — J3089 Other allergic rhinitis: Secondary | ICD-10-CM | POA: Diagnosis not present

## 2018-08-17 DIAGNOSIS — J301 Allergic rhinitis due to pollen: Secondary | ICD-10-CM | POA: Diagnosis not present

## 2018-08-17 DIAGNOSIS — E349 Endocrine disorder, unspecified: Secondary | ICD-10-CM | POA: Diagnosis not present

## 2018-08-17 DIAGNOSIS — J3081 Allergic rhinitis due to animal (cat) (dog) hair and dander: Secondary | ICD-10-CM | POA: Diagnosis not present

## 2018-08-17 MED ORDER — TESTOSTERONE CYPIONATE 100 MG/ML IM SOLN
300.0000 mg | INTRAMUSCULAR | Status: DC
  Administered 2018-08-17: 300 mg via INTRAMUSCULAR

## 2018-08-17 NOTE — Progress Notes (Signed)
Per orders of Dr.Burchette pt given testosterone Im injection. Pt tolerated well

## 2018-08-19 DIAGNOSIS — J3089 Other allergic rhinitis: Secondary | ICD-10-CM | POA: Diagnosis not present

## 2018-08-19 DIAGNOSIS — J301 Allergic rhinitis due to pollen: Secondary | ICD-10-CM | POA: Diagnosis not present

## 2018-08-19 DIAGNOSIS — J3081 Allergic rhinitis due to animal (cat) (dog) hair and dander: Secondary | ICD-10-CM | POA: Diagnosis not present

## 2018-08-23 DIAGNOSIS — J301 Allergic rhinitis due to pollen: Secondary | ICD-10-CM | POA: Diagnosis not present

## 2018-08-23 DIAGNOSIS — J3089 Other allergic rhinitis: Secondary | ICD-10-CM | POA: Diagnosis not present

## 2018-08-23 DIAGNOSIS — J3081 Allergic rhinitis due to animal (cat) (dog) hair and dander: Secondary | ICD-10-CM | POA: Diagnosis not present

## 2018-08-25 DIAGNOSIS — J3081 Allergic rhinitis due to animal (cat) (dog) hair and dander: Secondary | ICD-10-CM | POA: Diagnosis not present

## 2018-08-25 DIAGNOSIS — J3089 Other allergic rhinitis: Secondary | ICD-10-CM | POA: Diagnosis not present

## 2018-08-25 DIAGNOSIS — J301 Allergic rhinitis due to pollen: Secondary | ICD-10-CM | POA: Diagnosis not present

## 2018-08-31 DIAGNOSIS — J301 Allergic rhinitis due to pollen: Secondary | ICD-10-CM | POA: Diagnosis not present

## 2018-08-31 DIAGNOSIS — J3089 Other allergic rhinitis: Secondary | ICD-10-CM | POA: Diagnosis not present

## 2018-08-31 DIAGNOSIS — J3081 Allergic rhinitis due to animal (cat) (dog) hair and dander: Secondary | ICD-10-CM | POA: Diagnosis not present

## 2018-09-02 ENCOUNTER — Other Ambulatory Visit (INDEPENDENT_AMBULATORY_CARE_PROVIDER_SITE_OTHER): Payer: BLUE CROSS/BLUE SHIELD

## 2018-09-02 ENCOUNTER — Ambulatory Visit (INDEPENDENT_AMBULATORY_CARE_PROVIDER_SITE_OTHER): Payer: BLUE CROSS/BLUE SHIELD | Admitting: *Deleted

## 2018-09-02 DIAGNOSIS — R7989 Other specified abnormal findings of blood chemistry: Secondary | ICD-10-CM | POA: Diagnosis not present

## 2018-09-02 DIAGNOSIS — E349 Endocrine disorder, unspecified: Secondary | ICD-10-CM

## 2018-09-02 NOTE — Progress Notes (Signed)
Per orders of Dr. Caryl Never, injection of Testosterone 300mg  given by [name]. Patient tolerated injection well.

## 2018-09-03 LAB — TESTOSTERONE: Testosterone: 372 ng/dL (ref 250–827)

## 2018-09-08 ENCOUNTER — Encounter: Payer: Self-pay | Admitting: Family Medicine

## 2018-09-12 ENCOUNTER — Other Ambulatory Visit: Payer: Self-pay | Admitting: Family Medicine

## 2018-09-12 DIAGNOSIS — D1801 Hemangioma of skin and subcutaneous tissue: Secondary | ICD-10-CM | POA: Diagnosis not present

## 2018-09-12 DIAGNOSIS — L812 Freckles: Secondary | ICD-10-CM | POA: Diagnosis not present

## 2018-09-12 DIAGNOSIS — L821 Other seborrheic keratosis: Secondary | ICD-10-CM | POA: Diagnosis not present

## 2018-09-12 DIAGNOSIS — D225 Melanocytic nevi of trunk: Secondary | ICD-10-CM | POA: Diagnosis not present

## 2018-09-12 NOTE — Telephone Encounter (Signed)
Last OV 07/11/18, No future OV  Last filled 09/15/17, # 30 with 5 refills

## 2018-09-12 NOTE — Telephone Encounter (Signed)
Refills OK. 

## 2018-09-13 DIAGNOSIS — J3089 Other allergic rhinitis: Secondary | ICD-10-CM | POA: Diagnosis not present

## 2018-09-13 DIAGNOSIS — J3081 Allergic rhinitis due to animal (cat) (dog) hair and dander: Secondary | ICD-10-CM | POA: Diagnosis not present

## 2018-09-13 DIAGNOSIS — J301 Allergic rhinitis due to pollen: Secondary | ICD-10-CM | POA: Diagnosis not present

## 2018-09-14 ENCOUNTER — Ambulatory Visit (INDEPENDENT_AMBULATORY_CARE_PROVIDER_SITE_OTHER): Payer: BLUE CROSS/BLUE SHIELD | Admitting: *Deleted

## 2018-09-14 DIAGNOSIS — E349 Endocrine disorder, unspecified: Secondary | ICD-10-CM

## 2018-09-14 MED ORDER — TESTOSTERONE CYPIONATE 200 MG/ML IM SOLN
300.0000 mg | INTRAMUSCULAR | Status: DC
  Administered 2018-09-14 – 2018-09-29 (×2): 300 mg via INTRAMUSCULAR

## 2018-09-14 NOTE — Progress Notes (Addendum)
Patient in for Testosterone injection. Injection tolerated well.   Agree with injection as given above.  Kristian Covey MD [facility]

## 2018-09-22 ENCOUNTER — Ambulatory Visit (INDEPENDENT_AMBULATORY_CARE_PROVIDER_SITE_OTHER): Payer: BLUE CROSS/BLUE SHIELD

## 2018-09-22 DIAGNOSIS — R7989 Other specified abnormal findings of blood chemistry: Secondary | ICD-10-CM

## 2018-09-22 DIAGNOSIS — J301 Allergic rhinitis due to pollen: Secondary | ICD-10-CM | POA: Diagnosis not present

## 2018-09-22 DIAGNOSIS — J3089 Other allergic rhinitis: Secondary | ICD-10-CM | POA: Diagnosis not present

## 2018-09-22 DIAGNOSIS — J3081 Allergic rhinitis due to animal (cat) (dog) hair and dander: Secondary | ICD-10-CM | POA: Diagnosis not present

## 2018-09-22 MED ORDER — TESTOSTERONE CYPIONATE 200 MG/ML IM SOLN
300.0000 mg | INTRAMUSCULAR | Status: DC
  Administered 2018-09-22: 300 mg via INTRAMUSCULAR

## 2018-09-22 NOTE — Progress Notes (Signed)
Per orders of Dr. Caryl Never, injection of Testosterone 300 mg was given by Carola Rhine. Patient tolerated injection well.

## 2018-09-29 ENCOUNTER — Ambulatory Visit (INDEPENDENT_AMBULATORY_CARE_PROVIDER_SITE_OTHER): Payer: BLUE CROSS/BLUE SHIELD | Admitting: *Deleted

## 2018-09-29 ENCOUNTER — Other Ambulatory Visit: Payer: Self-pay | Admitting: Family Medicine

## 2018-09-29 ENCOUNTER — Other Ambulatory Visit: Payer: Self-pay

## 2018-09-29 DIAGNOSIS — J3089 Other allergic rhinitis: Secondary | ICD-10-CM | POA: Diagnosis not present

## 2018-09-29 DIAGNOSIS — J3081 Allergic rhinitis due to animal (cat) (dog) hair and dander: Secondary | ICD-10-CM | POA: Diagnosis not present

## 2018-09-29 DIAGNOSIS — E349 Endocrine disorder, unspecified: Secondary | ICD-10-CM

## 2018-09-29 DIAGNOSIS — J301 Allergic rhinitis due to pollen: Secondary | ICD-10-CM | POA: Diagnosis not present

## 2018-09-29 NOTE — Progress Notes (Signed)
Patient in for testosterone injection. Injection tolerated well

## 2018-10-05 DIAGNOSIS — J3081 Allergic rhinitis due to animal (cat) (dog) hair and dander: Secondary | ICD-10-CM | POA: Diagnosis not present

## 2018-10-05 DIAGNOSIS — J301 Allergic rhinitis due to pollen: Secondary | ICD-10-CM | POA: Diagnosis not present

## 2018-10-05 DIAGNOSIS — J3089 Other allergic rhinitis: Secondary | ICD-10-CM | POA: Diagnosis not present

## 2018-10-06 ENCOUNTER — Other Ambulatory Visit: Payer: Self-pay | Admitting: Family Medicine

## 2018-10-10 DIAGNOSIS — J3089 Other allergic rhinitis: Secondary | ICD-10-CM | POA: Diagnosis not present

## 2018-10-10 DIAGNOSIS — J301 Allergic rhinitis due to pollen: Secondary | ICD-10-CM | POA: Diagnosis not present

## 2018-10-10 DIAGNOSIS — J3081 Allergic rhinitis due to animal (cat) (dog) hair and dander: Secondary | ICD-10-CM | POA: Diagnosis not present

## 2018-10-11 ENCOUNTER — Other Ambulatory Visit: Payer: Self-pay

## 2018-10-11 ENCOUNTER — Encounter: Payer: Self-pay | Admitting: Family Medicine

## 2018-10-11 MED ORDER — TESTOSTERONE CYPIONATE 200 MG/ML IJ SOLN: 300.0000 mg | INTRAMUSCULAR | 2 refills | Status: DC

## 2018-10-12 ENCOUNTER — Other Ambulatory Visit: Payer: Self-pay | Admitting: Family Medicine

## 2018-10-12 ENCOUNTER — Telehealth: Payer: Self-pay | Admitting: Family Medicine

## 2018-10-12 NOTE — Telephone Encounter (Signed)
Copied from CRM [id number]. Topic: Quick Communication - Rx Refill/Question >> Oct 12, 2018  9:20 AM Frances Furbish L wrote: Medication: Testosterone Cypionate 200 MG/ML SOLN [[id number]] pharmacy called and stated that two sets of directions were attached to this RX. Pharmacy would like [place] to send over another RX with correct directions. Please advise

## 2018-10-12 NOTE — Telephone Encounter (Signed)
Called pharmacy and let him know that I just sent another fax and then gave him a verbal for the testosterone with 1 refill. Pharmacist verbalized an understanding.

## 2018-10-14 ENCOUNTER — Telehealth: Payer: Self-pay

## 2018-10-14 NOTE — Telephone Encounter (Signed)
Called patient and gave him the update of our facility not able to see nurse patients at this time. I also asked if patient had a way of receiving injections and he stated that he has a Advertising account executive and they could give injections. I asked patient to send updates of when given, location, and time so we could document for him until further notice for schedule. Patient verbalized an understanding.   Copied from CRM [id number]. Topic: Appointment Scheduling - Scheduling Inquiry for Clinic >> Oct 14, 2018 11:39 AM Lynne Logan D wrote: Reason for CRM: Pt called to schedule his testosterone injection. Advised to send CRM. Please reach out to pt to schedule.

## 2018-10-17 DIAGNOSIS — J3081 Allergic rhinitis due to animal (cat) (dog) hair and dander: Secondary | ICD-10-CM | POA: Diagnosis not present

## 2018-10-17 DIAGNOSIS — J3089 Other allergic rhinitis: Secondary | ICD-10-CM | POA: Diagnosis not present

## 2018-10-17 DIAGNOSIS — J301 Allergic rhinitis due to pollen: Secondary | ICD-10-CM | POA: Diagnosis not present

## 2018-10-19 ENCOUNTER — Encounter: Payer: Self-pay | Admitting: Family Medicine

## 2018-10-20 ENCOUNTER — Other Ambulatory Visit: Payer: Self-pay | Admitting: Family Medicine

## 2018-10-25 ENCOUNTER — Encounter: Payer: Self-pay | Admitting: Family Medicine

## 2018-10-27 DIAGNOSIS — J3081 Allergic rhinitis due to animal (cat) (dog) hair and dander: Secondary | ICD-10-CM | POA: Diagnosis not present

## 2018-10-27 DIAGNOSIS — J3089 Other allergic rhinitis: Secondary | ICD-10-CM | POA: Diagnosis not present

## 2018-10-27 DIAGNOSIS — J301 Allergic rhinitis due to pollen: Secondary | ICD-10-CM | POA: Diagnosis not present

## 2018-10-31 DIAGNOSIS — J301 Allergic rhinitis due to pollen: Secondary | ICD-10-CM | POA: Diagnosis not present

## 2018-10-31 DIAGNOSIS — J3081 Allergic rhinitis due to animal (cat) (dog) hair and dander: Secondary | ICD-10-CM | POA: Diagnosis not present

## 2018-10-31 DIAGNOSIS — J3089 Other allergic rhinitis: Secondary | ICD-10-CM | POA: Diagnosis not present

## 2018-11-08 DIAGNOSIS — J3089 Other allergic rhinitis: Secondary | ICD-10-CM | POA: Diagnosis not present

## 2018-11-08 DIAGNOSIS — J301 Allergic rhinitis due to pollen: Secondary | ICD-10-CM | POA: Diagnosis not present

## 2018-11-08 DIAGNOSIS — J3081 Allergic rhinitis due to animal (cat) (dog) hair and dander: Secondary | ICD-10-CM | POA: Diagnosis not present

## 2018-11-14 DIAGNOSIS — J3081 Allergic rhinitis due to animal (cat) (dog) hair and dander: Secondary | ICD-10-CM | POA: Diagnosis not present

## 2018-11-14 DIAGNOSIS — J3089 Other allergic rhinitis: Secondary | ICD-10-CM | POA: Diagnosis not present

## 2018-11-14 DIAGNOSIS — J301 Allergic rhinitis due to pollen: Secondary | ICD-10-CM | POA: Diagnosis not present

## 2018-11-21 ENCOUNTER — Other Ambulatory Visit: Payer: Self-pay | Admitting: Family Medicine

## 2018-11-24 DIAGNOSIS — J301 Allergic rhinitis due to pollen: Secondary | ICD-10-CM | POA: Diagnosis not present

## 2018-11-24 DIAGNOSIS — J3081 Allergic rhinitis due to animal (cat) (dog) hair and dander: Secondary | ICD-10-CM | POA: Diagnosis not present

## 2018-11-24 DIAGNOSIS — J3089 Other allergic rhinitis: Secondary | ICD-10-CM | POA: Diagnosis not present

## 2018-11-28 DIAGNOSIS — J3081 Allergic rhinitis due to animal (cat) (dog) hair and dander: Secondary | ICD-10-CM | POA: Diagnosis not present

## 2018-11-28 DIAGNOSIS — J301 Allergic rhinitis due to pollen: Secondary | ICD-10-CM | POA: Diagnosis not present

## 2018-11-28 DIAGNOSIS — J3089 Other allergic rhinitis: Secondary | ICD-10-CM | POA: Diagnosis not present

## 2018-12-05 DIAGNOSIS — J301 Allergic rhinitis due to pollen: Secondary | ICD-10-CM | POA: Diagnosis not present

## 2018-12-05 DIAGNOSIS — J3081 Allergic rhinitis due to animal (cat) (dog) hair and dander: Secondary | ICD-10-CM | POA: Diagnosis not present

## 2018-12-05 DIAGNOSIS — J3089 Other allergic rhinitis: Secondary | ICD-10-CM | POA: Diagnosis not present

## 2018-12-13 DIAGNOSIS — J3081 Allergic rhinitis due to animal (cat) (dog) hair and dander: Secondary | ICD-10-CM | POA: Diagnosis not present

## 2018-12-13 DIAGNOSIS — J3089 Other allergic rhinitis: Secondary | ICD-10-CM | POA: Diagnosis not present

## 2018-12-13 DIAGNOSIS — J301 Allergic rhinitis due to pollen: Secondary | ICD-10-CM | POA: Diagnosis not present

## 2018-12-14 DIAGNOSIS — J301 Allergic rhinitis due to pollen: Secondary | ICD-10-CM | POA: Diagnosis not present

## 2018-12-15 DIAGNOSIS — J3089 Other allergic rhinitis: Secondary | ICD-10-CM | POA: Diagnosis not present

## 2018-12-15 DIAGNOSIS — J3081 Allergic rhinitis due to animal (cat) (dog) hair and dander: Secondary | ICD-10-CM | POA: Diagnosis not present

## 2018-12-19 DIAGNOSIS — J301 Allergic rhinitis due to pollen: Secondary | ICD-10-CM | POA: Diagnosis not present

## 2018-12-19 DIAGNOSIS — J3081 Allergic rhinitis due to animal (cat) (dog) hair and dander: Secondary | ICD-10-CM | POA: Diagnosis not present

## 2018-12-19 DIAGNOSIS — J3089 Other allergic rhinitis: Secondary | ICD-10-CM | POA: Diagnosis not present

## 2018-12-26 DIAGNOSIS — J3081 Allergic rhinitis due to animal (cat) (dog) hair and dander: Secondary | ICD-10-CM | POA: Diagnosis not present

## 2018-12-26 DIAGNOSIS — J3089 Other allergic rhinitis: Secondary | ICD-10-CM | POA: Diagnosis not present

## 2018-12-26 DIAGNOSIS — J301 Allergic rhinitis due to pollen: Secondary | ICD-10-CM | POA: Diagnosis not present

## 2019-01-04 DIAGNOSIS — J301 Allergic rhinitis due to pollen: Secondary | ICD-10-CM | POA: Diagnosis not present

## 2019-01-04 DIAGNOSIS — J3089 Other allergic rhinitis: Secondary | ICD-10-CM | POA: Diagnosis not present

## 2019-01-04 DIAGNOSIS — J3081 Allergic rhinitis due to animal (cat) (dog) hair and dander: Secondary | ICD-10-CM | POA: Diagnosis not present

## 2019-01-09 DIAGNOSIS — J301 Allergic rhinitis due to pollen: Secondary | ICD-10-CM | POA: Diagnosis not present

## 2019-01-09 DIAGNOSIS — J3081 Allergic rhinitis due to animal (cat) (dog) hair and dander: Secondary | ICD-10-CM | POA: Diagnosis not present

## 2019-01-09 DIAGNOSIS — J3089 Other allergic rhinitis: Secondary | ICD-10-CM | POA: Diagnosis not present

## 2019-01-13 DIAGNOSIS — M7541 Impingement syndrome of right shoulder: Secondary | ICD-10-CM | POA: Diagnosis not present

## 2019-01-13 DIAGNOSIS — M25511 Pain in right shoulder: Secondary | ICD-10-CM | POA: Diagnosis not present

## 2019-01-14 DIAGNOSIS — M7541 Impingement syndrome of right shoulder: Secondary | ICD-10-CM | POA: Insufficient documentation

## 2019-01-16 DIAGNOSIS — J3081 Allergic rhinitis due to animal (cat) (dog) hair and dander: Secondary | ICD-10-CM | POA: Diagnosis not present

## 2019-01-16 DIAGNOSIS — J301 Allergic rhinitis due to pollen: Secondary | ICD-10-CM | POA: Diagnosis not present

## 2019-01-16 DIAGNOSIS — J3089 Other allergic rhinitis: Secondary | ICD-10-CM | POA: Diagnosis not present

## 2019-01-18 DIAGNOSIS — J3089 Other allergic rhinitis: Secondary | ICD-10-CM | POA: Diagnosis not present

## 2019-01-18 DIAGNOSIS — J3081 Allergic rhinitis due to animal (cat) (dog) hair and dander: Secondary | ICD-10-CM | POA: Diagnosis not present

## 2019-01-18 DIAGNOSIS — J301 Allergic rhinitis due to pollen: Secondary | ICD-10-CM | POA: Diagnosis not present

## 2019-01-24 DIAGNOSIS — J3081 Allergic rhinitis due to animal (cat) (dog) hair and dander: Secondary | ICD-10-CM | POA: Diagnosis not present

## 2019-01-24 DIAGNOSIS — J3089 Other allergic rhinitis: Secondary | ICD-10-CM | POA: Diagnosis not present

## 2019-01-24 DIAGNOSIS — J301 Allergic rhinitis due to pollen: Secondary | ICD-10-CM | POA: Diagnosis not present

## 2019-01-25 ENCOUNTER — Other Ambulatory Visit: Payer: Self-pay | Admitting: Family Medicine

## 2019-01-26 DIAGNOSIS — J301 Allergic rhinitis due to pollen: Secondary | ICD-10-CM | POA: Diagnosis not present

## 2019-01-27 MED ORDER — LOVASTATIN 20 MG PO TABS: 20.0000 mg | ORAL_TABLET | Freq: Every day | ORAL | 0 refills | Status: DC

## 2019-01-27 MED ORDER — DICLOFENAC SODIUM 75 MG PO TBEC: 75.0000 mg | DELAYED_RELEASE_TABLET | Freq: Two times a day (BID) | ORAL | 0 refills | Status: DC

## 2019-01-27 NOTE — Telephone Encounter (Signed)
Please see refill for these controlled meds

## 2019-01-29 MED ORDER — TESTOSTERONE CYPIONATE 200 MG/ML IM SOLN: INTRAMUSCULAR | 0 refills | Status: DC

## 2019-01-29 MED ORDER — CYCLOBENZAPRINE HCL 10 MG PO TABS: 10.0000 mg | ORAL_TABLET | Freq: Three times a day (TID) | ORAL | 0 refills | Status: DC | PRN

## 2019-01-29 MED ORDER — ZOLPIDEM TARTRATE 5 MG PO TABS: 5.0000 mg | ORAL_TABLET | Freq: Every evening | ORAL | 0 refills | Status: DC | PRN

## 2019-01-30 DIAGNOSIS — J3081 Allergic rhinitis due to animal (cat) (dog) hair and dander: Secondary | ICD-10-CM | POA: Diagnosis not present

## 2019-01-30 DIAGNOSIS — J3089 Other allergic rhinitis: Secondary | ICD-10-CM | POA: Diagnosis not present

## 2019-01-30 DIAGNOSIS — J301 Allergic rhinitis due to pollen: Secondary | ICD-10-CM | POA: Diagnosis not present

## 2019-02-01 DIAGNOSIS — M67911 Unspecified disorder of synovium and tendon, right shoulder: Secondary | ICD-10-CM | POA: Diagnosis not present

## 2019-02-04 ENCOUNTER — Other Ambulatory Visit: Payer: Self-pay | Admitting: Orthopedic Surgery

## 2019-02-04 DIAGNOSIS — M67911 Unspecified disorder of synovium and tendon, right shoulder: Secondary | ICD-10-CM

## 2019-02-06 DIAGNOSIS — M7541 Impingement syndrome of right shoulder: Secondary | ICD-10-CM | POA: Diagnosis not present

## 2019-02-06 DIAGNOSIS — M25611 Stiffness of right shoulder, not elsewhere classified: Secondary | ICD-10-CM | POA: Diagnosis not present

## 2019-02-06 DIAGNOSIS — J3081 Allergic rhinitis due to animal (cat) (dog) hair and dander: Secondary | ICD-10-CM | POA: Diagnosis not present

## 2019-02-06 DIAGNOSIS — M25511 Pain in right shoulder: Secondary | ICD-10-CM | POA: Diagnosis not present

## 2019-02-06 DIAGNOSIS — J301 Allergic rhinitis due to pollen: Secondary | ICD-10-CM | POA: Diagnosis not present

## 2019-02-06 DIAGNOSIS — J3089 Other allergic rhinitis: Secondary | ICD-10-CM | POA: Diagnosis not present

## 2019-02-08 DIAGNOSIS — M25511 Pain in right shoulder: Secondary | ICD-10-CM | POA: Diagnosis not present

## 2019-02-08 DIAGNOSIS — M7541 Impingement syndrome of right shoulder: Secondary | ICD-10-CM | POA: Diagnosis not present

## 2019-02-08 DIAGNOSIS — M25611 Stiffness of right shoulder, not elsewhere classified: Secondary | ICD-10-CM | POA: Diagnosis not present

## 2019-02-13 DIAGNOSIS — J301 Allergic rhinitis due to pollen: Secondary | ICD-10-CM | POA: Diagnosis not present

## 2019-02-13 DIAGNOSIS — J3081 Allergic rhinitis due to animal (cat) (dog) hair and dander: Secondary | ICD-10-CM | POA: Diagnosis not present

## 2019-02-13 DIAGNOSIS — J3089 Other allergic rhinitis: Secondary | ICD-10-CM | POA: Diagnosis not present

## 2019-02-14 DIAGNOSIS — M25511 Pain in right shoulder: Secondary | ICD-10-CM | POA: Diagnosis not present

## 2019-02-14 DIAGNOSIS — M7541 Impingement syndrome of right shoulder: Secondary | ICD-10-CM | POA: Diagnosis not present

## 2019-02-14 DIAGNOSIS — M25611 Stiffness of right shoulder, not elsewhere classified: Secondary | ICD-10-CM | POA: Diagnosis not present

## 2019-02-20 DIAGNOSIS — J3081 Allergic rhinitis due to animal (cat) (dog) hair and dander: Secondary | ICD-10-CM | POA: Diagnosis not present

## 2019-02-20 DIAGNOSIS — M7541 Impingement syndrome of right shoulder: Secondary | ICD-10-CM | POA: Diagnosis not present

## 2019-02-20 DIAGNOSIS — M25611 Stiffness of right shoulder, not elsewhere classified: Secondary | ICD-10-CM | POA: Diagnosis not present

## 2019-02-20 DIAGNOSIS — M25511 Pain in right shoulder: Secondary | ICD-10-CM | POA: Diagnosis not present

## 2019-02-20 DIAGNOSIS — J301 Allergic rhinitis due to pollen: Secondary | ICD-10-CM | POA: Diagnosis not present

## 2019-02-20 DIAGNOSIS — J3089 Other allergic rhinitis: Secondary | ICD-10-CM | POA: Diagnosis not present

## 2019-02-23 DIAGNOSIS — M25511 Pain in right shoulder: Secondary | ICD-10-CM | POA: Diagnosis not present

## 2019-02-23 DIAGNOSIS — M25611 Stiffness of right shoulder, not elsewhere classified: Secondary | ICD-10-CM | POA: Diagnosis not present

## 2019-02-23 DIAGNOSIS — M7541 Impingement syndrome of right shoulder: Secondary | ICD-10-CM | POA: Diagnosis not present

## 2019-02-25 ENCOUNTER — Ambulatory Visit
Admission: RE | Admit: 2019-02-25 | Discharge: 2019-02-25 | Disposition: A | Discharge status: Home / Self Care | Payer: BC Managed Care – PPO | Source: Ambulatory Visit | Attending: Orthopedic Surgery | Admitting: Orthopedic Surgery

## 2019-02-25 ENCOUNTER — Other Ambulatory Visit: Payer: Self-pay

## 2019-02-25 DIAGNOSIS — M25511 Pain in right shoulder: Secondary | ICD-10-CM | POA: Diagnosis not present

## 2019-02-25 DIAGNOSIS — M67911 Unspecified disorder of synovium and tendon, right shoulder: Secondary | ICD-10-CM

## 2019-02-27 DIAGNOSIS — J3081 Allergic rhinitis due to animal (cat) (dog) hair and dander: Secondary | ICD-10-CM | POA: Diagnosis not present

## 2019-02-27 DIAGNOSIS — J301 Allergic rhinitis due to pollen: Secondary | ICD-10-CM | POA: Diagnosis not present

## 2019-02-27 DIAGNOSIS — J3089 Other allergic rhinitis: Secondary | ICD-10-CM | POA: Diagnosis not present

## 2019-02-28 DIAGNOSIS — M7541 Impingement syndrome of right shoulder: Secondary | ICD-10-CM | POA: Diagnosis not present

## 2019-02-28 DIAGNOSIS — M25511 Pain in right shoulder: Secondary | ICD-10-CM | POA: Diagnosis not present

## 2019-02-28 DIAGNOSIS — M25611 Stiffness of right shoulder, not elsewhere classified: Secondary | ICD-10-CM | POA: Diagnosis not present

## 2019-03-01 DIAGNOSIS — M75121 Complete rotator cuff tear or rupture of right shoulder, not specified as traumatic: Secondary | ICD-10-CM | POA: Diagnosis not present

## 2019-03-02 DIAGNOSIS — M7541 Impingement syndrome of right shoulder: Secondary | ICD-10-CM | POA: Diagnosis not present

## 2019-03-02 DIAGNOSIS — M25511 Pain in right shoulder: Secondary | ICD-10-CM | POA: Diagnosis not present

## 2019-03-02 DIAGNOSIS — M25611 Stiffness of right shoulder, not elsewhere classified: Secondary | ICD-10-CM | POA: Diagnosis not present

## 2019-03-06 ENCOUNTER — Other Ambulatory Visit: Payer: Self-pay | Admitting: Orthopedic Surgery

## 2019-03-06 ENCOUNTER — Other Ambulatory Visit: Payer: Self-pay | Admitting: Family Medicine

## 2019-03-07 ENCOUNTER — Other Ambulatory Visit: Payer: Self-pay | Admitting: Family Medicine

## 2019-03-07 DIAGNOSIS — J3081 Allergic rhinitis due to animal (cat) (dog) hair and dander: Secondary | ICD-10-CM | POA: Diagnosis not present

## 2019-03-07 DIAGNOSIS — J301 Allergic rhinitis due to pollen: Secondary | ICD-10-CM | POA: Diagnosis not present

## 2019-03-07 DIAGNOSIS — J3089 Other allergic rhinitis: Secondary | ICD-10-CM | POA: Diagnosis not present

## 2019-03-07 NOTE — Telephone Encounter (Signed)
Please see message. °

## 2019-03-07 NOTE — Telephone Encounter (Signed)
Last OV 06/2018, No future OV  Ambien last filled 01/29/19, # 30 with 0 refills  I will decline the other prescriptions since they were sent to fill on 01/27/19 and is too early to fill once you send back to me. Thanks!

## 2019-03-07 NOTE — Telephone Encounter (Signed)
THIS WAS SENT IN AT 12:05  PM TODAY- there is notation that receipt confirmed by pharmacy.  There were two others that were not in need for refill and those were denied.  Are we sure pharmacy didn't get the Ambien- because computer said it went through.

## 2019-03-07 NOTE — Telephone Encounter (Signed)
Called patient and let him know that this refill is ready for pick up as I called the [facility] and spoke to Summer who stated it was ready. Patient verbalized that he would go later to get.

## 2019-03-07 NOTE — Telephone Encounter (Signed)
Refilled the Ambien.   As you noted,  Should not need others yet.

## 2019-03-14 DIAGNOSIS — J301 Allergic rhinitis due to pollen: Secondary | ICD-10-CM | POA: Diagnosis not present

## 2019-03-14 DIAGNOSIS — J3089 Other allergic rhinitis: Secondary | ICD-10-CM | POA: Diagnosis not present

## 2019-03-14 DIAGNOSIS — J3081 Allergic rhinitis due to animal (cat) (dog) hair and dander: Secondary | ICD-10-CM | POA: Diagnosis not present

## 2019-03-20 ENCOUNTER — Other Ambulatory Visit (HOSPITAL_COMMUNITY)
Admission: RE | Admit: 2019-03-20 | Discharge: 2019-03-20 | Disposition: A | Discharge status: Home / Self Care | Payer: BC Managed Care – PPO | Source: Ambulatory Visit | Attending: Orthopedic Surgery | Admitting: Orthopedic Surgery

## 2019-03-20 DIAGNOSIS — J3089 Other allergic rhinitis: Secondary | ICD-10-CM | POA: Diagnosis not present

## 2019-03-20 DIAGNOSIS — J301 Allergic rhinitis due to pollen: Secondary | ICD-10-CM | POA: Diagnosis not present

## 2019-03-20 DIAGNOSIS — Z01812 Encounter for preprocedural laboratory examination: Secondary | ICD-10-CM | POA: Insufficient documentation

## 2019-03-20 DIAGNOSIS — J3081 Allergic rhinitis due to animal (cat) (dog) hair and dander: Secondary | ICD-10-CM | POA: Diagnosis not present

## 2019-03-20 DIAGNOSIS — Z20828 Contact with and (suspected) exposure to other viral communicable diseases: Secondary | ICD-10-CM | POA: Diagnosis not present

## 2019-03-21 ENCOUNTER — Encounter (HOSPITAL_BASED_OUTPATIENT_CLINIC_OR_DEPARTMENT_OTHER): Payer: Self-pay

## 2019-03-21 LAB — SARS CORONAVIRUS 2 (TAT 6-24 HRS): SARS Coronavirus 2: NEGATIVE

## 2019-03-22 ENCOUNTER — Encounter (HOSPITAL_BASED_OUTPATIENT_CLINIC_OR_DEPARTMENT_OTHER): Payer: Self-pay | Admitting: *Deleted

## 2019-03-22 ENCOUNTER — Other Ambulatory Visit: Payer: Self-pay

## 2019-03-22 NOTE — Progress Notes (Signed)
Spoke w/ pt via phone for pre-op interview.  Npo after mn w/ exception clear liquids until 0700 then nothing by mouth, pt verbalized understanding. Arrive 0800.  Pt had covid test done 03-20-2019.

## 2019-03-23 ENCOUNTER — Ambulatory Visit (HOSPITAL_BASED_OUTPATIENT_CLINIC_OR_DEPARTMENT_OTHER): Payer: BC Managed Care – PPO | Admitting: Anesthesiology

## 2019-03-23 ENCOUNTER — Encounter (HOSPITAL_BASED_OUTPATIENT_CLINIC_OR_DEPARTMENT_OTHER): Payer: Self-pay

## 2019-03-23 ENCOUNTER — Ambulatory Visit (HOSPITAL_BASED_OUTPATIENT_CLINIC_OR_DEPARTMENT_OTHER)
Admission: RE | Admit: 2019-03-23 | Discharge: 2019-03-23 | Disposition: A | Discharge status: Home / Self Care | Payer: BC Managed Care – PPO | Attending: Orthopedic Surgery | Admitting: Orthopedic Surgery

## 2019-03-23 ENCOUNTER — Encounter (HOSPITAL_BASED_OUTPATIENT_CLINIC_OR_DEPARTMENT_OTHER)
Admission: RE | Disposition: A | Discharge status: Home / Self Care | Payer: Self-pay | Source: Home / Self Care | Attending: Orthopedic Surgery

## 2019-03-23 DIAGNOSIS — M75121 Complete rotator cuff tear or rupture of right shoulder, not specified as traumatic: Secondary | ICD-10-CM | POA: Diagnosis not present

## 2019-03-23 DIAGNOSIS — X58XXXA Exposure to other specified factors, initial encounter: Secondary | ICD-10-CM | POA: Diagnosis not present

## 2019-03-23 DIAGNOSIS — Z79899 Other long term (current) drug therapy: Secondary | ICD-10-CM | POA: Insufficient documentation

## 2019-03-23 DIAGNOSIS — S46111A Strain of muscle, fascia and tendon of long head of biceps, right arm, initial encounter: Secondary | ICD-10-CM | POA: Insufficient documentation

## 2019-03-23 DIAGNOSIS — Y939 Activity, unspecified: Secondary | ICD-10-CM | POA: Diagnosis not present

## 2019-03-23 DIAGNOSIS — S43391A Subluxation of other parts of right shoulder girdle, initial encounter: Secondary | ICD-10-CM | POA: Diagnosis not present

## 2019-03-23 DIAGNOSIS — Z820 Family history of epilepsy and other diseases of the nervous system: Secondary | ICD-10-CM | POA: Diagnosis not present

## 2019-03-23 DIAGNOSIS — Z8249 Family history of ischemic heart disease and other diseases of the circulatory system: Secondary | ICD-10-CM | POA: Insufficient documentation

## 2019-03-23 DIAGNOSIS — Z8261 Family history of arthritis: Secondary | ICD-10-CM | POA: Diagnosis not present

## 2019-03-23 DIAGNOSIS — Z800 Family history of malignant neoplasm of digestive organs: Secondary | ICD-10-CM | POA: Diagnosis not present

## 2019-03-23 DIAGNOSIS — Z791 Long term (current) use of non-steroidal anti-inflammatories (NSAID): Secondary | ICD-10-CM | POA: Insufficient documentation

## 2019-03-23 DIAGNOSIS — N400 Enlarged prostate without lower urinary tract symptoms: Secondary | ICD-10-CM | POA: Diagnosis not present

## 2019-03-23 DIAGNOSIS — M75101 Unspecified rotator cuff tear or rupture of right shoulder, not specified as traumatic: Secondary | ICD-10-CM | POA: Insufficient documentation

## 2019-03-23 DIAGNOSIS — M7541 Impingement syndrome of right shoulder: Secondary | ICD-10-CM | POA: Insufficient documentation

## 2019-03-23 DIAGNOSIS — E785 Hyperlipidemia, unspecified: Secondary | ICD-10-CM | POA: Insufficient documentation

## 2019-03-23 DIAGNOSIS — Z823 Family history of stroke: Secondary | ICD-10-CM | POA: Insufficient documentation

## 2019-03-23 DIAGNOSIS — G8918 Other acute postprocedural pain: Secondary | ICD-10-CM | POA: Diagnosis not present

## 2019-03-23 HISTORY — DX: Personal history of other medical treatment: Z92.89

## 2019-03-23 HISTORY — PX: ARTHOSCOPIC ROTAOR CUFF REPAIR: SHX5002

## 2019-03-23 HISTORY — DX: Benign prostatic hyperplasia without lower urinary tract symptoms: N40.0

## 2019-03-23 HISTORY — DX: Allergic rhinitis, unspecified: J30.9

## 2019-03-23 HISTORY — DX: Testicular hypofunction: E29.1

## 2019-03-23 HISTORY — DX: Hyperlipidemia, unspecified: E78.5

## 2019-03-23 HISTORY — DX: Presence of spectacles and contact lenses: Z97.3

## 2019-03-23 HISTORY — DX: Male erectile dysfunction, unspecified: N52.9

## 2019-03-23 HISTORY — DX: Unspecified rotator cuff tear or rupture of right shoulder, not specified as traumatic: M75.101

## 2019-03-23 SURGERY — REPAIR, ROTATOR CUFF, ARTHROSCOPIC
Anesthesia: General | Site: Shoulder | Laterality: Right

## 2019-03-23 MED ORDER — ROCURONIUM BROMIDE 10 MG/ML (PF) SYRINGE
PREFILLED_SYRINGE | INTRAVENOUS | Status: DC | PRN
  Administered 2019-03-23: 50 mg via INTRAVENOUS

## 2019-03-23 MED ORDER — LIDOCAINE 2% (20 MG/ML) 5 ML SYRINGE
INTRAMUSCULAR | Status: AC
  Filled 2019-03-23: qty 5

## 2019-03-23 MED ORDER — PROPOFOL 10 MG/ML IV BOLUS
INTRAVENOUS | Status: DC | PRN
  Administered 2019-03-23: 150 mg via INTRAVENOUS

## 2019-03-23 MED ORDER — MIDAZOLAM HCL 2 MG/2ML IJ SOLN
INTRAMUSCULAR | Status: AC
  Filled 2019-03-23: qty 2

## 2019-03-23 MED ORDER — EPHEDRINE SULFATE-NACL 50-0.9 MG/10ML-% IV SOSY
PREFILLED_SYRINGE | INTRAVENOUS | Status: DC | PRN
  Administered 2019-03-23: 10 mg via INTRAVENOUS

## 2019-03-23 MED ORDER — PHENYLEPHRINE 40 MCG/ML (10ML) SYRINGE FOR IV PUSH (FOR BLOOD PRESSURE SUPPORT)
PREFILLED_SYRINGE | INTRAVENOUS | Status: DC | PRN
  Administered 2019-03-23 (×3): 80 ug via INTRAVENOUS

## 2019-03-23 MED ORDER — CELECOXIB 200 MG PO CAPS
200.0000 mg | ORAL_CAPSULE | Freq: Once | ORAL | Status: AC
  Administered 2019-03-23: 200 mg via ORAL
  Filled 2019-03-23: qty 1

## 2019-03-23 MED ORDER — CELECOXIB 200 MG PO CAPS
ORAL_CAPSULE | ORAL | Status: AC
  Filled 2019-03-23: qty 1

## 2019-03-23 MED ORDER — FENTANYL CITRATE (PF) 100 MCG/2ML IJ SOLN
50.0000 ug | INTRAMUSCULAR | Status: AC | PRN
  Administered 2019-03-23 (×2): 50 ug via INTRAVENOUS
  Filled 2019-03-23: qty 1

## 2019-03-23 MED ORDER — BUPIVACAINE LIPOSOME 1.3 % IJ SUSP
INTRAMUSCULAR | Status: DC | PRN
  Administered 2019-03-23: 10 mL via PERINEURAL

## 2019-03-23 MED ORDER — SODIUM CHLORIDE 0.9 % IR SOLN
Status: DC | PRN
  Administered 2019-03-23: 12000 mL

## 2019-03-23 MED ORDER — CYCLOBENZAPRINE HCL 10 MG PO TABS: 10.0000 mg | ORAL_TABLET | Freq: Three times a day (TID) | ORAL | 0 refills | Status: DC

## 2019-03-23 MED ORDER — DEXAMETHASONE SODIUM PHOSPHATE 10 MG/ML IJ SOLN
INTRAMUSCULAR | Status: AC
  Filled 2019-03-23: qty 1

## 2019-03-23 MED ORDER — MIDAZOLAM HCL 2 MG/2ML IJ SOLN
2.0000 mg | Freq: Once | INTRAMUSCULAR | Status: AC
  Administered 2019-03-23: 2 mg via INTRAVENOUS
  Filled 2019-03-23: qty 2

## 2019-03-23 MED ORDER — OXYCODONE-ACETAMINOPHEN 5-325 MG PO TABS: 1.0000 | ORAL_TABLET | ORAL | 0 refills | Status: DC | PRN

## 2019-03-23 MED ORDER — BUPIVACAINE-EPINEPHRINE (PF) 0.5% -1:200000 IJ SOLN
INTRAMUSCULAR | Status: DC | PRN
  Administered 2019-03-23: 10 mL via PERINEURAL

## 2019-03-23 MED ORDER — CHLORHEXIDINE GLUCONATE 4 % EX LIQD
60.0000 mL | Freq: Once | CUTANEOUS | Status: DC
  Filled 2019-03-23: qty 118

## 2019-03-23 MED ORDER — ONDANSETRON HCL 4 MG/2ML IJ SOLN
4.0000 mg | Freq: Once | INTRAMUSCULAR | Status: DC | PRN
  Filled 2019-03-23: qty 2

## 2019-03-23 MED ORDER — FENTANYL CITRATE (PF) 100 MCG/2ML IJ SOLN
INTRAMUSCULAR | Status: AC
  Filled 2019-03-23: qty 2

## 2019-03-23 MED ORDER — SODIUM CHLORIDE 0.9 % IV SOLN
INTRAVENOUS | Status: DC | PRN
  Administered 2019-03-23: 50 ug/min via INTRAVENOUS

## 2019-03-23 MED ORDER — CEFAZOLIN SODIUM-DEXTROSE 2-4 GM/100ML-% IV SOLN
2.0000 g | INTRAVENOUS | Status: AC
  Administered 2019-03-23: 2 g via INTRAVENOUS
  Filled 2019-03-23: qty 100

## 2019-03-23 MED ORDER — ONDANSETRON HCL 4 MG/2ML IJ SOLN
INTRAMUSCULAR | Status: DC | PRN
  Administered 2019-03-23: 4 mg via INTRAVENOUS

## 2019-03-23 MED ORDER — ONDANSETRON HCL 4 MG/2ML IJ SOLN
INTRAMUSCULAR | Status: AC
  Filled 2019-03-23: qty 2

## 2019-03-23 MED ORDER — MORPHINE SULFATE (PF) 4 MG/ML IV SOLN
INTRAVENOUS | Status: AC
  Filled 2019-03-23: qty 1

## 2019-03-23 MED ORDER — ACETAMINOPHEN 500 MG PO TABS
ORAL_TABLET | ORAL | Status: AC
  Filled 2019-03-23: qty 2

## 2019-03-23 MED ORDER — ACETAMINOPHEN 500 MG PO TABS
1000.0000 mg | ORAL_TABLET | Freq: Once | ORAL | Status: AC
  Administered 2019-03-23: 1000 mg via ORAL
  Filled 2019-03-23: qty 2

## 2019-03-23 MED ORDER — ROCURONIUM BROMIDE 10 MG/ML (PF) SYRINGE
PREFILLED_SYRINGE | INTRAVENOUS | Status: AC
  Filled 2019-03-23: qty 10

## 2019-03-23 MED ORDER — PROPOFOL 10 MG/ML IV BOLUS
INTRAVENOUS | Status: AC
  Filled 2019-03-23: qty 20

## 2019-03-23 MED ORDER — BUPIVACAINE HCL (PF) 0.5 % IJ SOLN
INTRAMUSCULAR | Status: AC
  Filled 2019-03-23: qty 30

## 2019-03-23 MED ORDER — LIDOCAINE 2% (20 MG/ML) 5 ML SYRINGE
INTRAMUSCULAR | Status: DC | PRN
  Administered 2019-03-23: 80 mg via INTRAVENOUS

## 2019-03-23 MED ORDER — LACTATED RINGERS IV SOLN
INTRAVENOUS | Status: DC
  Administered 2019-03-23: 50 mL/h via INTRAVENOUS
  Filled 2019-03-23: qty 1000

## 2019-03-23 MED ORDER — DEXAMETHASONE SODIUM PHOSPHATE 10 MG/ML IJ SOLN
INTRAMUSCULAR | Status: DC | PRN
  Administered 2019-03-23: 5 mg via INTRAVENOUS

## 2019-03-23 MED ORDER — KETOROLAC TROMETHAMINE 30 MG/ML IJ SOLN
INTRAMUSCULAR | Status: AC
  Filled 2019-03-23: qty 1

## 2019-03-23 MED ORDER — FENTANYL CITRATE (PF) 100 MCG/2ML IJ SOLN
25.0000 ug | INTRAMUSCULAR | Status: DC | PRN
  Filled 2019-03-23: qty 1

## 2019-03-23 MED ORDER — FENTANYL CITRATE (PF) 100 MCG/2ML IJ SOLN
INTRAMUSCULAR | Status: DC | PRN
  Administered 2019-03-23: 50 ug via INTRAVENOUS

## 2019-03-23 MED ORDER — CEFAZOLIN SODIUM-DEXTROSE 2-4 GM/100ML-% IV SOLN
INTRAVENOUS | Status: AC
  Filled 2019-03-23: qty 100

## 2019-03-23 SURGICAL SUPPLY — 66 items
AID PSTN UNV HD RSTRNT DISP (MISCELLANEOUS) ×1
ANCH SUT SWLK 19.1X4.75 VT (Anchor) ×2 IMPLANT
ANCHOR PEEK 4.75X19.1 SWLK C (Anchor) ×4 IMPLANT
ANCHOR SUT 1.8 FBRTK KNTLS 2SU (Anchor) ×4 IMPLANT
APL PRP STRL LF DISP 70% ISPRP (MISCELLANEOUS) ×1
BNDG COHESIVE 4X5 TAN STRL (GAUZE/BANDAGES/DRESSINGS) IMPLANT
BURR OVAL 8 FLU 4.0MM X 13CM (MISCELLANEOUS) ×1
BURR OVAL 8 FLU 4.0X13 (MISCELLANEOUS) ×2 IMPLANT
CANNULA 5.75X7 CRYSTAL CLEAR (CANNULA) ×3 IMPLANT
CANNULA TWIST IN 8.25X7CM (CANNULA) ×4 IMPLANT
CHLORAPREP W/TINT 26 (MISCELLANEOUS) ×3 IMPLANT
COVER WAND RF STERILE (DRAPES) ×3 IMPLANT
CUTTER BONE 4.0MM X 13CM (MISCELLANEOUS) ×2 IMPLANT
DECANTER SPIKE VIAL GLASS SM (MISCELLANEOUS) IMPLANT
DRAPE INCISE IOBAN 66X45 STRL (DRAPES) ×3 IMPLANT
DRAPE LG THREE QUARTER DISP (DRAPES) IMPLANT
DRAPE ORTHO SPLIT 87X125 STRL (DRAPES) ×6 IMPLANT
DRAPE STERI 35X30 U-POUCH (DRAPES) ×3 IMPLANT
DRAPE SURG 17X23 STRL (DRAPES) ×3 IMPLANT
DRAPE U-SHAPE 47X51 STRL (DRAPES) ×3 IMPLANT
DRAPE U-SHAPE 76X120 STRL (DRAPES) ×6 IMPLANT
DRSG PAD ABDOMINAL 8X10 ST (GAUZE/BANDAGES/DRESSINGS) ×3 IMPLANT
ELECT REM PT RETURN 9FT ADLT (ELECTROSURGICAL) ×3
ELECTRODE REM PT RTRN 9FT ADLT (ELECTROSURGICAL) ×1 IMPLANT
GAUZE SPONGE 4X4 12PLY STRL (GAUZE/BANDAGES/DRESSINGS) ×3 IMPLANT
GAUZE SPONGE 4X4 12PLY STRL LF (GAUZE/BANDAGES/DRESSINGS) ×2 IMPLANT
GAUZE XEROFORM 1X8 LF (GAUZE/BANDAGES/DRESSINGS) ×3 IMPLANT
GLOVE BIO SURGEON STRL SZ7 (GLOVE) ×3 IMPLANT
GLOVE BIO SURGEON STRL SZ7.5 (GLOVE) ×6 IMPLANT
GLOVE BIOGEL PI IND STRL 7.0 (GLOVE) ×1 IMPLANT
GLOVE BIOGEL PI IND STRL 8 (GLOVE) ×2 IMPLANT
GLOVE BIOGEL PI INDICATOR 7.0 (GLOVE) ×2
GLOVE BIOGEL PI INDICATOR 8 (GLOVE) ×4
GOWN STRL REUS W/ TWL LRG LVL3 (GOWN DISPOSABLE) ×2 IMPLANT
GOWN STRL REUS W/TWL LRG LVL3 (GOWN DISPOSABLE) ×6
KIT STR SPEAR 1.8 FBRTK DISP (KITS) ×2 IMPLANT
Knotless FiberTak Disposable Kit, Straight ×2 IMPLANT
MANIFOLD NEPTUNE II (INSTRUMENTS) ×3 IMPLANT
NDL 1/2 CIR CATGUT .05X1.09 (NEEDLE) IMPLANT
NDL SCORPION MULTI FIRE (NEEDLE) IMPLANT
NDL SUT 6 .5 CRC .975X.05 MAYO (NEEDLE) IMPLANT
NEEDLE 1/2 CIR CATGUT .05X1.09 (NEEDLE) IMPLANT
NEEDLE MAYO TAPER (NEEDLE)
NEEDLE SCORPION MULTI FIRE (NEEDLE) ×3 IMPLANT
NS IRRIG 1000ML POUR BTL (IV SOLUTION) ×2 IMPLANT
PACK ARTHROSCOPY DSU (CUSTOM PROCEDURE TRAY) ×3 IMPLANT
PACK BASIN DAY SURGERY FS (CUSTOM PROCEDURE TRAY) ×3 IMPLANT
PAD ABD 8X10 STRL (GAUZE/BANDAGES/DRESSINGS) ×2 IMPLANT
PROBE BIPOLAR ATHRO 135MM 90D (MISCELLANEOUS) ×4 IMPLANT
RESTRAINT HEAD UNIVERSAL NS (MISCELLANEOUS) ×3 IMPLANT
SLEEVE SCD COMPRESS KNEE MED (MISCELLANEOUS) ×3 IMPLANT
SLING ARM FOAM STRAP LRG (SOFTGOODS) ×2 IMPLANT
SLING ARM IMMOBILIZER MED (SOFTGOODS) IMPLANT
SLING ARM MED ADULT FOAM STRAP (SOFTGOODS) IMPLANT
SLING ARM XL FOAM STRAP (SOFTGOODS) IMPLANT
SUPPORT WRAP ARM LG (MISCELLANEOUS) IMPLANT
SUT ETHILON 3 0 PS 1 (SUTURE) ×3 IMPLANT
SUT PDS AB 0 CT 36 (SUTURE) ×2 IMPLANT
SUT TIGER TAPE 7 IN WHITE (SUTURE) ×4 IMPLANT
SUTURE TAPE 1.3 40 TPR END (SUTURE) IMPLANT
SUTURETAPE 1.3 40 TPR END (SUTURE)
TAPE CLOTH SURG 6X10 WHT LF (GAUZE/BANDAGES/DRESSINGS) ×2 IMPLANT
TAPE FIBER 2MM 7IN #2 BLUE (SUTURE) IMPLANT
TOWEL OR 17X26 10 PK STRL BLUE (TOWEL DISPOSABLE) ×3 IMPLANT
TUBING ARTHROSCOPY IRRIG 16FT (MISCELLANEOUS) ×3 IMPLANT
TUBING SUCTION 1/4X6FT (MISCELLANEOUS) ×3 IMPLANT

## 2019-03-23 NOTE — Anesthesia Preprocedure Evaluation (Addendum)
Anesthesia Evaluation  Patient identified by MRN, date of birth, ID band Patient awake    Reviewed: Allergy & Precautions, NPO status , Patient's Chart, lab work & pertinent test results  Airway Mallampati: II  TM Distance: >3 FB Neck ROM: Full    Dental  (+) Teeth Intact, Dental Advisory Given   Pulmonary neg pulmonary ROS,    Pulmonary exam normal breath sounds clear to auscultation       Cardiovascular Normal cardiovascular exam Rhythm:Regular Rate:Normal  Echo: 04-15-2016  mild LVH, ef 60-65%   Neuro/Psych negative neurological ROS  negative psych ROS   GI/Hepatic negative GI ROS, Neg liver ROS,   Endo/Other  negative endocrine ROS  Renal/GU negative Renal ROS   BPH    Musculoskeletal RIGHT SHOULDER ROTATOR CUFF TEAR, BICEP DISLOCATION   Abdominal   Peds  Hematology negative hematology ROS (+)   Anesthesia Other Findings Day of surgery medications reviewed with the patient.  Reproductive/Obstetrics                             Anesthesia Physical Anesthesia Plan  ASA: II  Anesthesia Plan: General   Post-op Pain Management:  Regional for Post-op pain   Induction: Intravenous  PONV Risk Score and Plan: 2 and Midazolam, Dexamethasone and Ondansetron  Airway Management Planned: Oral ETT  Additional Equipment:   Intra-op Plan:   Post-operative Plan: Extubation in OR  Informed Consent: I have reviewed the patients History and Physical, chart, labs and discussed the procedure including the risks, benefits and alternatives for the proposed anesthesia with the patient or authorized representative who has indicated his/her understanding and acceptance.     Dental advisory given  Plan Discussed with: CRNA and Anesthesiologist  Anesthesia Plan Comments:        Anesthesia Quick Evaluation

## 2019-03-23 NOTE — Op Note (Signed)
Procedure(s): ARTHROSCOPIC ROTATOR CUFF REPAIR; SUBACROMIAL DECOMPRESSION; BICEP TENODISIS Procedure Note  Kenneth Knapp male 58 y.o. 03/23/2019  Preoperative diagnosis: #1 right shoulder rotator cuff tear  #2 right shoulder proximal biceps subluxation  #3 right shoulder impingement with unfavorable acromial anatomy  Postoperative diagnosis: Same   Procedure(s) and Anesthesia Type:    * ARTHROSCOPIC ROTATOR CUFF REPAIR; SUBACROMIAL DECOMPRESSION; BICEP TENODISIS - General  Surgeon(s) and Role:    Tania Ade, MD - Primary     Surgeon: Isabella Stalling   Assistants: Jeanmarie Hubert PA-C (Danielle was present and scrubbed throughout the procedure and was essential in positioning, assisting with the camera and instrumentation,, and closure)  Anesthesia: General endotracheal anesthesia with preoperative interscalene block given by the attending anesthesiologist     Procedure Detail  ARTHROSCOPIC ROTATOR CUFF REPAIR; SUBACROMIAL DECOMPRESSION; BICEP TENODISIS  Estimated Blood Loss: Min         Drains: none  Blood Given: none         Specimens: none        Complications:  * No complications entered in OR log *         Disposition: PACU - hemodynamically stable.         Condition: stable    Procedure:   INDICATIONS FOR SURGERY: The patient is 58 y.o. male who has had a long history of right shoulder pain which is been refractory to nonoperative management.  MRI revealed full-thickness anterior supraspinatus tear as well as subluxation of the long head proximal biceps tendon.  Indicated for surgical treatment to try and decrease pain and restore function.  OPERATIVE FINDINGS: Examination under anesthesia: No stiffness or instability  DESCRIPTION OF PROCEDURE: The patient was identified in preoperative  holding area where I personally marked the operative site after  verifying site, side, and procedure with the patient. An interscalene block was given  by the attending anesthesiologist the holding area.  The patient was taken back to the operating room where general anesthesia was induced without complication and was placed in the beach-chair position with the back  elevated about 60 degrees and all extremities and head and neck carefully padded and  positioned.   The right upper extremity was then prepped and  draped in a standard sterile fashion. The appropriate time-out  procedure was carried out. The patient did receive IV antibiotics  within 30 minutes of incision.   A small posterior portal incision was made and the arthroscope was introduced into the joint. An anterior portal was then established above the subscapularis using needle localization. Small cannula was placed anteriorly. Diagnostic arthroscopy was then carried out.   The subscapularis was noted to be intact without any upper border tearing.  The biceps tendon was subluxed a bit and the superior labrum was completely detached involving the entire biceps origin.  The biceps tendon was pulled into the joint and had some significant fraying.  This was felt to be a pain generator and indicated for biceps tenodesis.  Therefore through a percutaneous approach the biceps was tagged with a 0 PDS suture.  It was then cut from the superior labrum which was then extensively debrided.  The joint surfaces look good without any significant chondromalacia.  The undersurface of the supraspinatus was examined and found to have a full-thickness small non-retracted rotator cuff tear.  The infraspinatus was intact.  The remainder of the labrum looked good.  The arthroscope was then introduced into the subacromial space a standard lateral portal was established  with needle localization. The shaver was used through the lateral portal to perform extensive bursectomy. Coracoacromial ligament was examined and found to be frayed indicating chronic impingement.  The bursal side of the rotator cuff tear  was identified.  The tear edge was severely ragged and degenerated.  This was debrided back to healthy edge.  The repair was then carried out by burring down the tuberosity to a bleeding surface to promote healing and then placing a 4.75 peek swivel lock anchor preloaded with 2 suture tapes just off the articular margin centered in the tear.  These 4 suture strands were then passed evenly throughout the tear anterior to posterior and brought over to an additional 4.75 peek swivel lock anchor bringing the sutures down nicely over the tuberosity evenly.  There was no significant undue tension.  At this point attention was turned to the proximal biceps tendon.  A large cannula was placed anterolateral accessory portal.  The camera was used through the lateral portal to view.  The proximal biceps sheath was identified by palpation and opened longitudinally.  The tendon was then brought into the subacromial space. The tendon was then tenodesed to the lower portion of the bicipital groove using 2 fiber tack removed suture anchors in a locking configuration.  The remainder of the long head biceps proximally was cut and discarded.  The tension was felt to be appropriate.  The coracoacromial ligament was taken down off the anterior acromion with the ArthroCare exposing a moderate hooked anterior acromial spur. A high-speed bur was then used through the lateral portal to take down the anterior acromial spur from lateral to medial in a standard acromioplasty.  The acromioplasty was also viewed from the lateral portal and the bur was used as necessary to ensure that the acromion was completely flat from posterior to anterior.  The arthroscopic equipment was removed from the joint and the portals were closed with 3-0 nylon in an interrupted fashion. Sterile dressings were then applied including Xeroform 4 x 4's ABDs and tape. The patient was then allowed to awaken from general anesthesia, placed in a sling, transferred to  the stretcher and taken to the recovery room in stable condition.   POSTOPERATIVE PLAN: The patient will be discharged home today and will followup in one week for suture removal and wound check.  He will follow the standard cuff protocol.

## 2019-03-23 NOTE — Discharge Instructions (Signed)
Discharge Instructions after Arthroscopic Shoulder Repair   A sling has been provided for you. Remain in your sling at all times. This includes sleeping in your sling.  Use ice on the shoulder intermittently over the first 48 hours after surgery.  Pain medicine has been prescribed for you.  Use your medicine liberally over the first 48 hours, and then you can begin to taper your use. You may take Extra Strength Tylenol or Tylenol only in place of the pain pills. DO NOT take ANY nonsteroidal anti-inflammatory pain medications: Advil, Motrin, Ibuprofen, Aleve, Naproxen, or Narprosyn.  You may remove your dressing after two days. If the incision sites are still moist, place a Band-Aid over the moist site(s). Change Band-Aids daily until dry.  You may shower 5 days after surgery. The incisions CANNOT get wet prior to 5 days. Simply allow the water to wash over the site and then pat dry. Do not rub the incisions. Make sure your axilla (armpit) is completely dry after showering.  Take one aspirin a day for 2 weeks after surgery, unless you have an aspirin sensitivity/ allergy or asthma.   Please call [phone] during normal business hours or [phone] after hours for any problems. Including the following:  - excessive redness of the incisions - drainage for more than 4 days - fever of more than 101.5 F  *Please note that pain medications will not be refilled after hours or on weekends.   Post Anesthesia Home Care Instructions  Activity: Get plenty of rest for the remainder of the day. A responsible individual must stay with you for 24 hours following the procedure.  For the next 24 hours, DO NOT: -Drive a car -Operate machinery -Drink alcoholic beverages -Take any medication unless instructed by your physician -Make any legal decisions or sign important papers.  Meals: Start with liquid foods such as gelatin or soup. Progress to regular foods as tolerated. Avoid greasy, spicy, heavy  foods. If nausea and/or vomiting occur, drink only clear liquids until the nausea and/or vomiting subsides. Call your physician if vomiting continues.  Special Instructions/Symptoms: Your throat may feel dry or sore from the anesthesia or the breathing tube placed in your throat during surgery. If this causes discomfort, gargle with warm salt water. The discomfort should disappear within 24 hours.  If you had a scopolamine patch placed behind your ear for the management of post- operative nausea and/or vomiting:  1. The medication in the patch is effective for 72 hours, after which it should be removed.  Wrap patch in a tissue and discard in the trash. Wash hands thoroughly with soap and water. 2. You may remove the patch earlier than 72 hours if you experience unpleasant side effects which may include dry mouth, dizziness or visual disturbances. 3. Avoid touching the patch. Wash your hands with soap and water after contact with the patch.     Regional Anesthesia Blocks  1. Numbness or the inability to move the "blocked" extremity may last from 3-48 hours after placement. The length of time depends on the medication injected and your individual response to the medication. If the numbness is not going away after 48 hours, call your surgeon.  2. The extremity that is blocked will need to be protected until the numbness is gone and the  Strength has returned. Because you cannot feel it, you will need to take extra care to avoid injury. Because it may be weak, you may have difficulty moving it or using it. You may   not know what position it is in without looking at it while the block is in effect.  3. For blocks in the legs and feet, returning to weight bearing and walking needs to be done carefully. You will need to wait until the numbness is entirely gone and the strength has returned. You should be able to move your leg and foot normally before you try and bear weight or walk. You will need someone  to be with you when you first try to ensure you do not fall and possibly risk injury.  4. Bruising and tenderness at the needle site are common side effects and will resolve in a few days.  5. Persistent numbness or new problems with movement should be communicated to the surgeon or the [facility] ([phone])/ [facility] ([phone]).  Information for Discharge Teaching: EXPAREL (bupivacaine liposome injectable suspension)   Your surgeon or anesthesiologist gave you EXPAREL(bupivacaine) to help control your pain after surgery.   EXPAREL is a local anesthetic that provides pain relief by numbing the tissue around the surgical site.  EXPAREL is designed to release pain medication over time and can control pain for up to 72 hours.  Depending on how you respond to EXPAREL, you may require less pain medication during your recovery.  Possible side effects:  Temporary loss of sensation or ability to move in the area where bupivacaine was injected.  Nausea, vomiting, constipation  Rarely, numbness and tingling in your mouth or lips, lightheadedness, or anxiety may occur.  Call your doctor right away if you think you may be experiencing any of these sensations, or if you have other questions regarding possible side effects.  Follow all other discharge instructions given to you by your surgeon or nurse. Eat a healthy diet and drink plenty of water or other fluids.  If you return to the hospital for any reason within 96 hours following the administration of EXPAREL, it is important for health care providers to know that you have received this anesthetic. A teal colored band has been placed on your arm with the date, time and amount of EXPAREL you have received in order to alert and inform your health care providers. Please leave this armband in place for the full 96 hours following administration, and then you may remove the band.    

## 2019-03-23 NOTE — Progress Notes (Signed)
Assisted Dr. Turk with right, ultrasound guided, interscalene  block. Side rails up, monitors on throughout procedure. See vital signs in flow sheet. Tolerated Procedure well. 

## 2019-03-23 NOTE — H&P (Signed)
Kenneth ChengKenneth A Mcdaniel is an 58 y.o. male.   Chief Complaint: R shoulder pain and dysfunction HPI: R shoulder pain with rotator cuff tear biceps subluxation, failed conservative management.  Past Medical History:  Diagnosis Date  . AR (allergic rhinitis)   . BPH (benign prostatic hyperplasia)   . ED (erectile dysfunction)   . History of echocardiogram    04-15-2016  mild LVH, ef 60-65%  . History of exercise stress test (03-22-2019 per pt had not any cardiac s&s since )   04-10-2016 (cardiology evaluation for precordial pain by dr t. Tresa Endokelly)  Low risk w/ low specificity (mild positive ekg changes at very high workload and brisk return to normal)  . Hyperlipidemia   . Hypogonadism male   . Rotator cuff tear, right   . Wears glasses     Past Surgical History:  Procedure Laterality Date  . COLONOSCOPY  2013  . LUMBAR DISC SURGERY  2002   L4 -- L5  . MASS EXCISION Left 06/08/2017   Procedure: EXCISION CYST BUTTOCK;  Surgeon: Harriette Bouillonornett, Thomas, MD;  Location: [facility];  Service: General;  Laterality: Left;  . WISDOM TOOTH EXTRACTION      Family History  Problem Relation Age of Onset  . ALS Mother   . Arthritis Mother   . Heart disease Father   . Stroke Father [id number]       hemorrhagic  . Colon cancer Paternal Grandmother   . Heart disease Paternal Grandfather    Social History:  reports that he has never smoked. He has never used smokeless tobacco. He reports current alcohol use. He reports that he does not use drugs.  Allergies: No Known Allergies  Medications Prior to Admission  Medication Sig Dispense Refill  . diclofenac (VOLTAREN) 75 MG EC tablet Take 1 tablet (75 mg total) by mouth 2 (two) times daily. 180 tablet 0  . fexofenadine (ALLEGRA) 180 MG tablet Take 180 mg by mouth at bedtime.     . lovastatin (MEVACOR) 20 MG tablet Take 1 tablet (20 mg total) by mouth at bedtime. (Patient taking differently: Take 20 mg by mouth at bedtime. ) 90 tablet 0  . Multiple  Vitamin (MULTIVITAMIN) tablet Take 1 tablet by mouth daily.    Marland Kitchen. PRESCRIPTION MEDICATION ALLERGY INJECTIONS WEEKLY    . sildenafil (VIAGRA) 50 MG tablet TAKE 1 TABLET BY MOUTH 1 HOUR PRIOR TO SEXUAL ACTIVITY 10 tablet 5  . testosterone cypionate (DEPOTESTOSTERONE CYPIONATE) 200 MG/ML injection Inject 1.5 mLs into the muscle every 10 days. (Patient taking differently: Inject 1.5 mLs into the muscle every 10 days.) 10 mL 0  . zolpidem (AMBIEN) 5 MG tablet TAKE 1 TABLET (5 MG TOTAL) BY MOUTH AT BEDTIME AS NEEDED FOR SLEEP. 30 tablet 0  . cyclobenzaprine (FLEXERIL) 10 MG tablet Take 1 tablet (10 mg total) by mouth every 8 (eight) hours as needed for muscle spasms. 30 tablet 0  . EPINEPHrine 0.3 mg/0.3 mL IJ SOAJ injection Use as needed      No results found for this or any previous visit (from the past 48 hour(s)). No results found.  Review of Systems  All other systems reviewed and are negative.   Blood pressure 107/68, pulse 68, temperature 97.8 F (36.6 C), temperature source Oral, resp. rate 13, height 5\' 11"  (1.803 m), weight 77.6 kg, SpO2 100 %. Physical Exam  Constitutional: He is oriented to person, place, and time. He appears well-developed and well-nourished.  HENT:  Head: Atraumatic.  Eyes: EOM  are normal.  Cardiovascular: Intact distal pulses.  Respiratory: Effort normal.  Musculoskeletal:     Comments: R shoulder pain with RC testing. NVID.  Neurological: He is alert and oriented to person, place, and time.  Skin: Skin is warm and dry.  Psychiatric: He has a normal mood and affect.     Assessment/Plan R shoulder pain with rotator cuff tear biceps subluxation, failed conservative management. Plan R arth RCR, SAD, Poss biceps tenotomy vs tenodesis. Risks / benefits of surgery discussed Consent on chart  NPO for OR Preop antibiotics   Isabella Stalling, MD 03/23/2019, 9:27 AM

## 2019-03-23 NOTE — Anesthesia Procedure Notes (Signed)
Anesthesia Regional Block: Interscalene brachial plexus block   Pre-Anesthetic Checklist: ,, timeout performed, Correct Patient, Correct Site, Correct Laterality, Correct Procedure, Correct Position, site marked, Risks and benefits discussed,  Surgical consent,  Pre-op evaluation,  At surgeon's request and post-op pain management  Laterality: Right  Prep: chloraprep       Needles:  Injection technique: Single-shot  Needle Type: Echogenic Stimulator Needle     Needle Length: 5cm  Needle Gauge: 22     Additional Needles:   Procedures:,,,, ultrasound used (permanent image in chart),,,,  Narrative:  Start time: 03/23/2019 9:10 AM End time: 03/23/2019 9:18 AM Injection made incrementally with aspirations every 5 mL.  Performed by: Personally  Anesthesiologist: Catalina Gravel, MD  Additional Notes: Functioning IV was confirmed and monitors were applied.  A 42mm 22ga Arrow echogenic stimulator needle was used. Sterile prep and drape, hand hygiene, and sterile gloves were used.  Negative aspiration and negative test dose prior to incremental administration of local anesthetic. The patient tolerated the procedure well.  Ultrasound guidance: relevent anatomy identified, needle position confirmed, local anesthetic spread visualized around nerve(s), vascular puncture avoided.  Image printed for medical record.

## 2019-03-23 NOTE — Anesthesia Postprocedure Evaluation (Signed)
Anesthesia Post Note  Patient: Kenneth Knapp  Procedure(s) Performed: ARTHROSCOPIC ROTATOR CUFF REPAIR; SUBACROMIAL DECOMPRESSION; BICEP TENODISIS (Right Shoulder)     Patient location during evaluation: PACU Anesthesia Type: General Level of consciousness: awake and alert Pain management: pain level controlled Vital Signs Assessment: post-procedure vital signs reviewed and stable Respiratory status: spontaneous breathing, nonlabored ventilation, respiratory function stable and patient connected to nasal cannula oxygen Cardiovascular status: blood pressure returned to baseline and stable Postop Assessment: no apparent nausea or vomiting Anesthetic complications: no    Last Vitals:  Vitals:   03/23/19 1230 03/23/19 1330  BP: 108/64 103/62  Pulse: 66   Resp: 17 17  Temp:    SpO2: 94% 97%    Last Pain:  Vitals:   03/23/19 1330  TempSrc:   PainSc: 0-No pain                 Catalina Gravel

## 2019-03-23 NOTE — Transfer of Care (Signed)
Immediate Anesthesia Transfer of Care Note  Patient: Kenneth Knapp  Procedure(s) Performed: ARTHROSCOPIC ROTATOR CUFF REPAIR; SUBACROMIAL DECOMPRESSION; BICEP TENODISIS (Right Shoulder)  Patient Location: PACU  Anesthesia Type:GA combined with regional for post-op pain  Level of Consciousness: sedated, patient cooperative and responds to stimulation  Airway & Oxygen Therapy: Patient Spontanous Breathing and Patient connected to nasal cannula oxygen  Post-op Assessment: Report given to RN, Post -op Vital signs reviewed and stable and Patient moving all extremities  Post vital signs: Reviewed and stable  Last Vitals:  Vitals Value Taken Time  BP 116/66 03/23/19 1149  Temp    Pulse 71 03/23/19 1151  Resp 14 03/23/19 1151  SpO2 97 % 03/23/19 1151  Vitals shown include unvalidated device data.  Last Pain:  Vitals:   03/23/19 0940  TempSrc:   PainSc: 0-No pain      Patients Stated Pain Goal: 5 (49/67/59 1638)  Complications: No apparent anesthesia complications

## 2019-03-23 NOTE — Anesthesia Procedure Notes (Signed)
Procedure Name: Intubation Date/Time: 03/23/2019 10:00 AM Performed by: Myna Bright, CRNA Pre-anesthesia Checklist: Patient identified, Emergency Drugs available, Suction available and Patient being monitored Patient Re-evaluated:Patient Re-evaluated prior to induction Oxygen Delivery Method: Circle system utilized Preoxygenation: Pre-oxygenation with 100% oxygen Induction Type: IV induction Ventilation: Mask ventilation without difficulty Laryngoscope Size: Mac and 4 Grade View: Grade I Tube type: Oral Tube size: 7.5 mm Number of attempts: 1 Airway Equipment and Method: Stylet Placement Confirmation: ETT inserted through vocal cords under direct vision,  positive ETCO2 and breath sounds checked- equal and bilateral Secured at: 22 cm Tube secured with: Tape Dental Injury: Teeth and Oropharynx as per pre-operative assessment

## 2019-03-28 ENCOUNTER — Encounter (HOSPITAL_BASED_OUTPATIENT_CLINIC_OR_DEPARTMENT_OTHER): Payer: Self-pay | Admitting: Orthopedic Surgery

## 2019-03-30 DIAGNOSIS — J301 Allergic rhinitis due to pollen: Secondary | ICD-10-CM | POA: Diagnosis not present

## 2019-03-30 DIAGNOSIS — J3081 Allergic rhinitis due to animal (cat) (dog) hair and dander: Secondary | ICD-10-CM | POA: Diagnosis not present

## 2019-03-30 DIAGNOSIS — J3089 Other allergic rhinitis: Secondary | ICD-10-CM | POA: Diagnosis not present

## 2019-04-01 ENCOUNTER — Encounter: Payer: Self-pay | Admitting: Family Medicine

## 2019-04-03 MED ORDER — ZOLPIDEM TARTRATE 5 MG PO TABS: ORAL_TABLET | ORAL | 1 refills | Status: DC

## 2019-04-03 NOTE — Telephone Encounter (Signed)
Last filled 03/07/2019  Please advise.

## 2019-04-03 NOTE — Telephone Encounter (Signed)
Refilled Ambien 

## 2019-04-06 ENCOUNTER — Encounter: Payer: Self-pay | Admitting: Family Medicine

## 2019-04-06 DIAGNOSIS — J3089 Other allergic rhinitis: Secondary | ICD-10-CM | POA: Diagnosis not present

## 2019-04-06 DIAGNOSIS — J301 Allergic rhinitis due to pollen: Secondary | ICD-10-CM | POA: Diagnosis not present

## 2019-04-06 DIAGNOSIS — J3081 Allergic rhinitis due to animal (cat) (dog) hair and dander: Secondary | ICD-10-CM | POA: Diagnosis not present

## 2019-04-11 DIAGNOSIS — J3081 Allergic rhinitis due to animal (cat) (dog) hair and dander: Secondary | ICD-10-CM | POA: Diagnosis not present

## 2019-04-11 DIAGNOSIS — J3089 Other allergic rhinitis: Secondary | ICD-10-CM | POA: Diagnosis not present

## 2019-04-11 DIAGNOSIS — J301 Allergic rhinitis due to pollen: Secondary | ICD-10-CM | POA: Diagnosis not present

## 2019-04-12 ENCOUNTER — Other Ambulatory Visit: Payer: Self-pay

## 2019-04-12 ENCOUNTER — Telehealth (INDEPENDENT_AMBULATORY_CARE_PROVIDER_SITE_OTHER): Payer: BC Managed Care – PPO | Admitting: Family Medicine

## 2019-04-12 DIAGNOSIS — M25611 Stiffness of right shoulder, not elsewhere classified: Secondary | ICD-10-CM | POA: Diagnosis not present

## 2019-04-12 DIAGNOSIS — M75121 Complete rotator cuff tear or rupture of right shoulder, not specified as traumatic: Secondary | ICD-10-CM | POA: Diagnosis not present

## 2019-04-12 DIAGNOSIS — F5104 Psychophysiologic insomnia: Secondary | ICD-10-CM | POA: Diagnosis not present

## 2019-04-12 DIAGNOSIS — R7989 Other specified abnormal findings of blood chemistry: Secondary | ICD-10-CM | POA: Diagnosis not present

## 2019-04-12 NOTE — Progress Notes (Signed)
This visit type was conducted due to national recommendations for restrictions regarding the COVID-19 pandemic in an effort to limit this patient's exposure and mitigate transmission in our community.   Virtual Visit via Video Note  I connected with Kenneth Knapp on 04/12/19 at  8:30 AM EDT by a video enabled telemedicine application and verified that I am speaking with the correct person using two identifiers.  Location patient: home Location provider:work or home office Persons participating in the virtual visit: patient, provider  I discussed the limitations of evaluation and management by telemedicine and the availability of in person appointments. The patient expressed understanding and agreed to proceed.   HPI:  Patient has history of low testosterone, hyperlipidemia, recent rotator cuff surgery.  Kenneth Knapp had called to discuss a couple things.  First, Kenneth Knapp has had some chronic insomnia.  Kenneth Knapp had been on Ambien 5 mg nightly and called requesting increase.  Kenneth Knapp has not been taking any pain medication other than plain Tylenol.  There have been some confusion and we thought Kenneth Knapp was requesting 15 mg which we explained exceeded maximum dosage.  Kenneth Knapp actually has been taking 5 mg at night and then occasionally supplement with 2.5 mg as needed later in the night.  Kenneth Knapp has previously taken things like melatonin without much improvement.  Kenneth Knapp has not tried supplementing melatonin with low-dose Ambien.  Second issue is low testosterone.  Kenneth Knapp has been getting his injections every 10 days at work and nurse practitioner there apparently has been drawing his labs.  Kenneth Knapp had recent hematocrit which was in the upper 40 range by report.  In looking back this is fairly consistent for him.  Kenneth Knapp is a non-smoker.  No history of DVT or pulmonary embolus.  Kenneth Knapp had PSA last January 1.84  ROS: See pertinent positives and negatives per HPI.  Past Medical History:  Diagnosis Date  . AR (allergic rhinitis)   . BPH (benign prostatic  hyperplasia)   . ED (erectile dysfunction)   . History of echocardiogram    04-15-2016  mild LVH, ef 60-65%  . History of exercise stress test (03-22-2019 per pt had not any cardiac s&s since )   04-10-2016 (cardiology evaluation for precordial pain by dr t. Claiborne Billings)  Low risk w/ low specificity (mild positive ekg changes at very high workload and brisk return to normal)  . Hyperlipidemia   . Hypogonadism male   . Rotator cuff tear, right   . Wears glasses     Past Surgical History:  Procedure Laterality Date  . ARTHOSCOPIC ROTAOR CUFF REPAIR Right 03/23/2019   Procedure: ARTHROSCOPIC ROTATOR CUFF REPAIR; SUBACROMIAL DECOMPRESSION; BICEP TENODISIS;  Surgeon: Tania Ade, MD;  Location: [place];  Service: Orthopedics;  Laterality: Right;  90 MIN  . COLONOSCOPY  2013  . LUMBAR DISC SURGERY  2002   L4 -- L5  . MASS EXCISION Left 06/08/2017   Procedure: EXCISION CYST BUTTOCK;  Surgeon: Erroll Luna, MD;  Location: [place];  Service: General;  Laterality: Left;  . WISDOM TOOTH EXTRACTION      Family History  Problem Relation Age of Onset  . ALS Mother   . Arthritis Mother   . Heart disease Father   . Stroke Father 59       hemorrhagic  . Colon cancer Paternal Grandmother   . Heart disease Paternal Grandfather     SOCIAL HX: Non-smoker.  Works in [employer] with [employer]   Current Outpatient Medications:  .  cyclobenzaprine (  FLEXERIL) 10 MG tablet, Take 1 tablet (10 mg total) by mouth 3 (three) times daily., Disp: 30 tablet, Rfl: 0 .  EPINEPHrine 0.3 mg/0.3 mL IJ SOAJ injection, Use as needed, Disp: , Rfl:  .  fexofenadine (ALLEGRA) 180 MG tablet, Take 180 mg by mouth at bedtime. , Disp: , Rfl:  .  lovastatin (MEVACOR) 20 MG tablet, Take 1 tablet (20 mg total) by mouth at bedtime. (Patient taking differently: Take 20 mg by mouth at bedtime. ), Disp: 90 tablet, Rfl: 0 .  Multiple Vitamin (MULTIVITAMIN) tablet, Take 1 tablet by mouth  daily., Disp: , Rfl:  .  oxyCODONE-acetaminophen (PERCOCET) 5-325 MG tablet, Take 1 tablet by mouth every 4 (four) hours as needed for severe pain., Disp: 20 tablet, Rfl: 0 .  PRESCRIPTION MEDICATION, ALLERGY INJECTIONS WEEKLY, Disp: , Rfl:  .  sildenafil (VIAGRA) 50 MG tablet, TAKE 1 TABLET BY MOUTH 1 HOUR PRIOR TO SEXUAL ACTIVITY, Disp: 10 tablet, Rfl: 5 .  testosterone cypionate (DEPOTESTOSTERONE CYPIONATE) 200 MG/ML injection, Inject 1.5 mLs into the muscle every 10 days. (Patient taking differently: Inject 1.5 mLs into the muscle every 10 days.), Disp: 10 mL, Rfl: 0 .  zolpidem (AMBIEN) 5 MG tablet, Take one to two po qhs prn insomnia., Disp: 60 tablet, Rfl: 1  EXAM:  VITALS per patient if applicable:  GENERAL: alert, oriented, appears well and in no acute distress  HEENT: atraumatic, conjunttiva clear, no obvious abnormalities on inspection of external nose and ears  NECK: normal movements of the head and neck  LUNGS: on inspection no signs of respiratory distress, breathing rate appears normal, no obvious gross SOB, gasping or wheezing  CV: no obvious cyanosis  MS: moves all visible extremities without noticeable abnormality  PSYCH/NEURO: pleasant and cooperative, no obvious depression or anxiety, speech and thought processing grossly intact  ASSESSMENT AND PLAN:  Discussed the following assessment and plan:  #1 chronic insomnia.  We discussed options for treatment some detail.  Avoid escalation in medication. -Suggest that Kenneth Knapp try melatonin over-the-counter 3 to 5 mg with his Ambien and eventually goal of trying to taper off this.  For the short-term we have written for 60 tablets/month where Kenneth Knapp can take up to 2 at night if necessary -Stressed importance of avoiding concomitant use of opioids with sedative-hypnotics and Kenneth Knapp understands  #2 low testosterone.  Mildly elevated hematocrit as above.  We explained that we would not recommend continued therapy for hematocrit over 55%.   This will be monitored every couple of months per nurse practitioner at his work  -We have suggested that Kenneth Knapp consider blood donation every 2 months if not monthly and this should help to offset.  Also reiterated the importance of recheck PSA by January. -We have asked that blood draws from his work be forwarded here for our records     I discussed the assessment and treatment plan with the patient. The patient was provided an opportunity to ask questions and all were answered. The patient agreed with the plan and demonstrated an understanding of the instructions.   The patient was advised to call back or seek an in-person evaluation if the symptoms worsen or if the condition fails to improve as anticipated.   Evelena Peat, MD

## 2019-04-17 DIAGNOSIS — M25611 Stiffness of right shoulder, not elsewhere classified: Secondary | ICD-10-CM | POA: Diagnosis not present

## 2019-04-17 DIAGNOSIS — M75121 Complete rotator cuff tear or rupture of right shoulder, not specified as traumatic: Secondary | ICD-10-CM | POA: Diagnosis not present

## 2019-04-18 ENCOUNTER — Encounter: Payer: Self-pay | Admitting: Family Medicine

## 2019-04-18 NOTE — Telephone Encounter (Signed)
Please advise 

## 2019-04-19 DIAGNOSIS — M75121 Complete rotator cuff tear or rupture of right shoulder, not specified as traumatic: Secondary | ICD-10-CM | POA: Diagnosis not present

## 2019-04-19 DIAGNOSIS — M25611 Stiffness of right shoulder, not elsewhere classified: Secondary | ICD-10-CM | POA: Diagnosis not present

## 2019-04-20 DIAGNOSIS — J3089 Other allergic rhinitis: Secondary | ICD-10-CM | POA: Diagnosis not present

## 2019-04-20 DIAGNOSIS — J301 Allergic rhinitis due to pollen: Secondary | ICD-10-CM | POA: Diagnosis not present

## 2019-04-20 DIAGNOSIS — J3081 Allergic rhinitis due to animal (cat) (dog) hair and dander: Secondary | ICD-10-CM | POA: Diagnosis not present

## 2019-04-21 DIAGNOSIS — M75121 Complete rotator cuff tear or rupture of right shoulder, not specified as traumatic: Secondary | ICD-10-CM | POA: Diagnosis not present

## 2019-04-21 DIAGNOSIS — M25611 Stiffness of right shoulder, not elsewhere classified: Secondary | ICD-10-CM | POA: Diagnosis not present

## 2019-04-25 DIAGNOSIS — M25611 Stiffness of right shoulder, not elsewhere classified: Secondary | ICD-10-CM | POA: Diagnosis not present

## 2019-04-25 DIAGNOSIS — M75121 Complete rotator cuff tear or rupture of right shoulder, not specified as traumatic: Secondary | ICD-10-CM | POA: Diagnosis not present

## 2019-04-26 ENCOUNTER — Other Ambulatory Visit: Payer: Self-pay | Admitting: Family Medicine

## 2019-04-26 DIAGNOSIS — J3089 Other allergic rhinitis: Secondary | ICD-10-CM | POA: Diagnosis not present

## 2019-04-26 DIAGNOSIS — J301 Allergic rhinitis due to pollen: Secondary | ICD-10-CM | POA: Diagnosis not present

## 2019-04-26 DIAGNOSIS — J3081 Allergic rhinitis due to animal (cat) (dog) hair and dander: Secondary | ICD-10-CM | POA: Diagnosis not present

## 2019-04-27 DIAGNOSIS — M75121 Complete rotator cuff tear or rupture of right shoulder, not specified as traumatic: Secondary | ICD-10-CM | POA: Diagnosis not present

## 2019-04-27 DIAGNOSIS — M25611 Stiffness of right shoulder, not elsewhere classified: Secondary | ICD-10-CM | POA: Diagnosis not present

## 2019-04-28 ENCOUNTER — Telehealth: Payer: Self-pay

## 2019-04-28 NOTE — Telephone Encounter (Signed)
Do we need lab orders put in?

## 2019-04-28 NOTE — Telephone Encounter (Signed)
Reason for CRM: Patient called requesting to speak with PCP nurse. She has not been able to go to rehab, did not want to go to the rehab in [place]. She is requesting on in [facility]. Patient did not disclose why she could not go/or why she did not want to go to [place].  Patient Call back [phone]

## 2019-05-01 DIAGNOSIS — M25611 Stiffness of right shoulder, not elsewhere classified: Secondary | ICD-10-CM | POA: Diagnosis not present

## 2019-05-01 DIAGNOSIS — M75121 Complete rotator cuff tear or rupture of right shoulder, not specified as traumatic: Secondary | ICD-10-CM | POA: Diagnosis not present

## 2019-05-03 NOTE — Telephone Encounter (Signed)
Dr Burchette's question hasn't been answered so we can't schedule this appointment.  We would also need orders prior to scheduling this.  Thanks! Tammy

## 2019-05-04 ENCOUNTER — Encounter: Payer: Self-pay | Admitting: Family Medicine

## 2019-05-04 DIAGNOSIS — M75121 Complete rotator cuff tear or rupture of right shoulder, not specified as traumatic: Secondary | ICD-10-CM | POA: Diagnosis not present

## 2019-05-04 DIAGNOSIS — M25611 Stiffness of right shoulder, not elsewhere classified: Secondary | ICD-10-CM | POA: Diagnosis not present

## 2019-05-08 DIAGNOSIS — M75121 Complete rotator cuff tear or rupture of right shoulder, not specified as traumatic: Secondary | ICD-10-CM | POA: Diagnosis not present

## 2019-05-08 DIAGNOSIS — J301 Allergic rhinitis due to pollen: Secondary | ICD-10-CM | POA: Diagnosis not present

## 2019-05-08 DIAGNOSIS — J3089 Other allergic rhinitis: Secondary | ICD-10-CM | POA: Diagnosis not present

## 2019-05-08 DIAGNOSIS — J3081 Allergic rhinitis due to animal (cat) (dog) hair and dander: Secondary | ICD-10-CM | POA: Diagnosis not present

## 2019-05-08 DIAGNOSIS — M25611 Stiffness of right shoulder, not elsewhere classified: Secondary | ICD-10-CM | POA: Diagnosis not present

## 2019-05-11 DIAGNOSIS — M75121 Complete rotator cuff tear or rupture of right shoulder, not specified as traumatic: Secondary | ICD-10-CM | POA: Diagnosis not present

## 2019-05-11 DIAGNOSIS — M25611 Stiffness of right shoulder, not elsewhere classified: Secondary | ICD-10-CM | POA: Diagnosis not present

## 2019-05-15 DIAGNOSIS — M25611 Stiffness of right shoulder, not elsewhere classified: Secondary | ICD-10-CM | POA: Diagnosis not present

## 2019-05-15 DIAGNOSIS — M75121 Complete rotator cuff tear or rupture of right shoulder, not specified as traumatic: Secondary | ICD-10-CM | POA: Diagnosis not present

## 2019-05-16 DIAGNOSIS — J301 Allergic rhinitis due to pollen: Secondary | ICD-10-CM | POA: Diagnosis not present

## 2019-05-16 DIAGNOSIS — J3081 Allergic rhinitis due to animal (cat) (dog) hair and dander: Secondary | ICD-10-CM | POA: Diagnosis not present

## 2019-05-16 DIAGNOSIS — J3089 Other allergic rhinitis: Secondary | ICD-10-CM | POA: Diagnosis not present

## 2019-05-18 DIAGNOSIS — M25611 Stiffness of right shoulder, not elsewhere classified: Secondary | ICD-10-CM | POA: Diagnosis not present

## 2019-05-18 DIAGNOSIS — M75121 Complete rotator cuff tear or rupture of right shoulder, not specified as traumatic: Secondary | ICD-10-CM | POA: Diagnosis not present

## 2019-05-22 DIAGNOSIS — M25611 Stiffness of right shoulder, not elsewhere classified: Secondary | ICD-10-CM | POA: Diagnosis not present

## 2019-05-22 DIAGNOSIS — M75121 Complete rotator cuff tear or rupture of right shoulder, not specified as traumatic: Secondary | ICD-10-CM | POA: Diagnosis not present

## 2019-05-23 DIAGNOSIS — J3081 Allergic rhinitis due to animal (cat) (dog) hair and dander: Secondary | ICD-10-CM | POA: Diagnosis not present

## 2019-05-23 DIAGNOSIS — J3089 Other allergic rhinitis: Secondary | ICD-10-CM | POA: Diagnosis not present

## 2019-05-23 DIAGNOSIS — J301 Allergic rhinitis due to pollen: Secondary | ICD-10-CM | POA: Diagnosis not present

## 2019-05-25 ENCOUNTER — Other Ambulatory Visit: Payer: Self-pay | Admitting: Family Medicine

## 2019-05-25 DIAGNOSIS — M75121 Complete rotator cuff tear or rupture of right shoulder, not specified as traumatic: Secondary | ICD-10-CM | POA: Diagnosis not present

## 2019-05-25 DIAGNOSIS — M25611 Stiffness of right shoulder, not elsewhere classified: Secondary | ICD-10-CM | POA: Diagnosis not present

## 2019-05-29 DIAGNOSIS — J301 Allergic rhinitis due to pollen: Secondary | ICD-10-CM | POA: Diagnosis not present

## 2019-05-29 DIAGNOSIS — J3089 Other allergic rhinitis: Secondary | ICD-10-CM | POA: Diagnosis not present

## 2019-05-29 DIAGNOSIS — J3081 Allergic rhinitis due to animal (cat) (dog) hair and dander: Secondary | ICD-10-CM | POA: Diagnosis not present

## 2019-06-01 DIAGNOSIS — M25611 Stiffness of right shoulder, not elsewhere classified: Secondary | ICD-10-CM | POA: Diagnosis not present

## 2019-06-01 DIAGNOSIS — M75121 Complete rotator cuff tear or rupture of right shoulder, not specified as traumatic: Secondary | ICD-10-CM | POA: Diagnosis not present

## 2019-06-05 DIAGNOSIS — M75121 Complete rotator cuff tear or rupture of right shoulder, not specified as traumatic: Secondary | ICD-10-CM | POA: Diagnosis not present

## 2019-06-05 DIAGNOSIS — M25611 Stiffness of right shoulder, not elsewhere classified: Secondary | ICD-10-CM | POA: Diagnosis not present

## 2019-06-07 ENCOUNTER — Other Ambulatory Visit: Payer: Self-pay

## 2019-06-07 ENCOUNTER — Ambulatory Visit (INDEPENDENT_AMBULATORY_CARE_PROVIDER_SITE_OTHER): Payer: BC Managed Care – PPO | Admitting: Family Medicine

## 2019-06-07 DIAGNOSIS — J3089 Other allergic rhinitis: Secondary | ICD-10-CM | POA: Diagnosis not present

## 2019-06-07 DIAGNOSIS — J3081 Allergic rhinitis due to animal (cat) (dog) hair and dander: Secondary | ICD-10-CM | POA: Diagnosis not present

## 2019-06-07 DIAGNOSIS — R7989 Other specified abnormal findings of blood chemistry: Secondary | ICD-10-CM

## 2019-06-07 DIAGNOSIS — E291 Testicular hypofunction: Secondary | ICD-10-CM

## 2019-06-07 DIAGNOSIS — J301 Allergic rhinitis due to pollen: Secondary | ICD-10-CM | POA: Diagnosis not present

## 2019-06-07 MED ORDER — TESTOSTERONE CYPIONATE 200 MG/ML IM SOLN
150.0000 mg | Freq: Once | INTRAMUSCULAR | Status: AC
  Administered 2019-06-07: 150 mg via INTRAMUSCULAR

## 2019-06-07 NOTE — Progress Notes (Signed)
Patient brought his own Testosterone and I gave him 1.5 mg injection into left gluteal area. Patient tolerated well.  Agree with injection as given.  Eulas Post MD [facility]

## 2019-06-08 DIAGNOSIS — M25611 Stiffness of right shoulder, not elsewhere classified: Secondary | ICD-10-CM | POA: Diagnosis not present

## 2019-06-08 DIAGNOSIS — M75121 Complete rotator cuff tear or rupture of right shoulder, not specified as traumatic: Secondary | ICD-10-CM | POA: Diagnosis not present

## 2019-06-09 DIAGNOSIS — J301 Allergic rhinitis due to pollen: Secondary | ICD-10-CM | POA: Diagnosis not present

## 2019-06-12 DIAGNOSIS — J3089 Other allergic rhinitis: Secondary | ICD-10-CM | POA: Diagnosis not present

## 2019-06-12 DIAGNOSIS — J3081 Allergic rhinitis due to animal (cat) (dog) hair and dander: Secondary | ICD-10-CM | POA: Diagnosis not present

## 2019-06-13 DIAGNOSIS — J3081 Allergic rhinitis due to animal (cat) (dog) hair and dander: Secondary | ICD-10-CM | POA: Diagnosis not present

## 2019-06-13 DIAGNOSIS — M75121 Complete rotator cuff tear or rupture of right shoulder, not specified as traumatic: Secondary | ICD-10-CM | POA: Diagnosis not present

## 2019-06-13 DIAGNOSIS — J3089 Other allergic rhinitis: Secondary | ICD-10-CM | POA: Diagnosis not present

## 2019-06-13 DIAGNOSIS — M25611 Stiffness of right shoulder, not elsewhere classified: Secondary | ICD-10-CM | POA: Diagnosis not present

## 2019-06-13 DIAGNOSIS — J301 Allergic rhinitis due to pollen: Secondary | ICD-10-CM | POA: Diagnosis not present

## 2019-06-19 DIAGNOSIS — M25611 Stiffness of right shoulder, not elsewhere classified: Secondary | ICD-10-CM | POA: Diagnosis not present

## 2019-06-19 DIAGNOSIS — M75121 Complete rotator cuff tear or rupture of right shoulder, not specified as traumatic: Secondary | ICD-10-CM | POA: Diagnosis not present

## 2019-06-20 DIAGNOSIS — J3081 Allergic rhinitis due to animal (cat) (dog) hair and dander: Secondary | ICD-10-CM | POA: Diagnosis not present

## 2019-06-20 DIAGNOSIS — J3089 Other allergic rhinitis: Secondary | ICD-10-CM | POA: Diagnosis not present

## 2019-06-20 DIAGNOSIS — J301 Allergic rhinitis due to pollen: Secondary | ICD-10-CM | POA: Diagnosis not present

## 2019-06-23 DIAGNOSIS — J3089 Other allergic rhinitis: Secondary | ICD-10-CM | POA: Diagnosis not present

## 2019-06-23 DIAGNOSIS — J301 Allergic rhinitis due to pollen: Secondary | ICD-10-CM | POA: Diagnosis not present

## 2019-06-23 DIAGNOSIS — J3081 Allergic rhinitis due to animal (cat) (dog) hair and dander: Secondary | ICD-10-CM | POA: Diagnosis not present

## 2019-06-26 DIAGNOSIS — J3081 Allergic rhinitis due to animal (cat) (dog) hair and dander: Secondary | ICD-10-CM | POA: Diagnosis not present

## 2019-06-26 DIAGNOSIS — J3089 Other allergic rhinitis: Secondary | ICD-10-CM | POA: Diagnosis not present

## 2019-06-26 DIAGNOSIS — J301 Allergic rhinitis due to pollen: Secondary | ICD-10-CM | POA: Diagnosis not present

## 2019-06-28 DIAGNOSIS — M75121 Complete rotator cuff tear or rupture of right shoulder, not specified as traumatic: Secondary | ICD-10-CM | POA: Diagnosis not present

## 2019-06-28 DIAGNOSIS — M25611 Stiffness of right shoulder, not elsewhere classified: Secondary | ICD-10-CM | POA: Diagnosis not present

## 2019-06-29 DIAGNOSIS — J3081 Allergic rhinitis due to animal (cat) (dog) hair and dander: Secondary | ICD-10-CM | POA: Diagnosis not present

## 2019-06-29 DIAGNOSIS — J301 Allergic rhinitis due to pollen: Secondary | ICD-10-CM | POA: Diagnosis not present

## 2019-06-29 DIAGNOSIS — J3089 Other allergic rhinitis: Secondary | ICD-10-CM | POA: Diagnosis not present

## 2019-07-04 DIAGNOSIS — J3081 Allergic rhinitis due to animal (cat) (dog) hair and dander: Secondary | ICD-10-CM | POA: Diagnosis not present

## 2019-07-04 DIAGNOSIS — J3089 Other allergic rhinitis: Secondary | ICD-10-CM | POA: Diagnosis not present

## 2019-07-04 DIAGNOSIS — J301 Allergic rhinitis due to pollen: Secondary | ICD-10-CM | POA: Diagnosis not present

## 2019-07-05 DIAGNOSIS — M25611 Stiffness of right shoulder, not elsewhere classified: Secondary | ICD-10-CM | POA: Diagnosis not present

## 2019-07-05 DIAGNOSIS — M75121 Complete rotator cuff tear or rupture of right shoulder, not specified as traumatic: Secondary | ICD-10-CM | POA: Diagnosis not present

## 2019-07-10 DIAGNOSIS — M75121 Complete rotator cuff tear or rupture of right shoulder, not specified as traumatic: Secondary | ICD-10-CM | POA: Diagnosis not present

## 2019-07-10 DIAGNOSIS — J301 Allergic rhinitis due to pollen: Secondary | ICD-10-CM | POA: Diagnosis not present

## 2019-07-10 DIAGNOSIS — M25611 Stiffness of right shoulder, not elsewhere classified: Secondary | ICD-10-CM | POA: Diagnosis not present

## 2019-07-10 DIAGNOSIS — J3089 Other allergic rhinitis: Secondary | ICD-10-CM | POA: Diagnosis not present

## 2019-07-10 DIAGNOSIS — J3081 Allergic rhinitis due to animal (cat) (dog) hair and dander: Secondary | ICD-10-CM | POA: Diagnosis not present

## 2019-07-17 ENCOUNTER — Other Ambulatory Visit: Payer: Self-pay | Admitting: Family Medicine

## 2019-07-17 DIAGNOSIS — M75121 Complete rotator cuff tear or rupture of right shoulder, not specified as traumatic: Secondary | ICD-10-CM | POA: Diagnosis not present

## 2019-07-17 DIAGNOSIS — M25611 Stiffness of right shoulder, not elsewhere classified: Secondary | ICD-10-CM | POA: Diagnosis not present

## 2019-07-17 DIAGNOSIS — J301 Allergic rhinitis due to pollen: Secondary | ICD-10-CM | POA: Diagnosis not present

## 2019-07-17 DIAGNOSIS — J3081 Allergic rhinitis due to animal (cat) (dog) hair and dander: Secondary | ICD-10-CM | POA: Diagnosis not present

## 2019-07-17 DIAGNOSIS — J3089 Other allergic rhinitis: Secondary | ICD-10-CM | POA: Diagnosis not present

## 2019-07-18 NOTE — Telephone Encounter (Signed)
Last Rx given on 9/14 for #60 with 1 ref

## 2019-07-24 DIAGNOSIS — M75121 Complete rotator cuff tear or rupture of right shoulder, not specified as traumatic: Secondary | ICD-10-CM | POA: Diagnosis not present

## 2019-07-24 DIAGNOSIS — M25611 Stiffness of right shoulder, not elsewhere classified: Secondary | ICD-10-CM | POA: Diagnosis not present

## 2019-07-24 DIAGNOSIS — Z09 Encounter for follow-up examination after completed treatment for conditions other than malignant neoplasm: Secondary | ICD-10-CM | POA: Diagnosis not present

## 2019-07-26 DIAGNOSIS — J301 Allergic rhinitis due to pollen: Secondary | ICD-10-CM | POA: Diagnosis not present

## 2019-07-26 DIAGNOSIS — J3089 Other allergic rhinitis: Secondary | ICD-10-CM | POA: Diagnosis not present

## 2019-07-26 DIAGNOSIS — J3081 Allergic rhinitis due to animal (cat) (dog) hair and dander: Secondary | ICD-10-CM | POA: Diagnosis not present

## 2019-07-27 ENCOUNTER — Other Ambulatory Visit: Payer: Self-pay | Admitting: Family Medicine

## 2019-07-27 DIAGNOSIS — M25611 Stiffness of right shoulder, not elsewhere classified: Secondary | ICD-10-CM | POA: Diagnosis not present

## 2019-07-27 DIAGNOSIS — M75121 Complete rotator cuff tear or rupture of right shoulder, not specified as traumatic: Secondary | ICD-10-CM | POA: Diagnosis not present

## 2019-08-02 DIAGNOSIS — J3089 Other allergic rhinitis: Secondary | ICD-10-CM | POA: Diagnosis not present

## 2019-08-02 DIAGNOSIS — J301 Allergic rhinitis due to pollen: Secondary | ICD-10-CM | POA: Diagnosis not present

## 2019-08-02 DIAGNOSIS — J3081 Allergic rhinitis due to animal (cat) (dog) hair and dander: Secondary | ICD-10-CM | POA: Diagnosis not present

## 2019-08-03 DIAGNOSIS — M75121 Complete rotator cuff tear or rupture of right shoulder, not specified as traumatic: Secondary | ICD-10-CM | POA: Diagnosis not present

## 2019-08-03 DIAGNOSIS — M25611 Stiffness of right shoulder, not elsewhere classified: Secondary | ICD-10-CM | POA: Diagnosis not present

## 2019-08-07 DIAGNOSIS — J3089 Other allergic rhinitis: Secondary | ICD-10-CM | POA: Diagnosis not present

## 2019-08-07 DIAGNOSIS — M75121 Complete rotator cuff tear or rupture of right shoulder, not specified as traumatic: Secondary | ICD-10-CM | POA: Diagnosis not present

## 2019-08-07 DIAGNOSIS — M25611 Stiffness of right shoulder, not elsewhere classified: Secondary | ICD-10-CM | POA: Diagnosis not present

## 2019-08-07 DIAGNOSIS — J3081 Allergic rhinitis due to animal (cat) (dog) hair and dander: Secondary | ICD-10-CM | POA: Diagnosis not present

## 2019-08-07 DIAGNOSIS — J301 Allergic rhinitis due to pollen: Secondary | ICD-10-CM | POA: Diagnosis not present

## 2019-08-10 DIAGNOSIS — M75121 Complete rotator cuff tear or rupture of right shoulder, not specified as traumatic: Secondary | ICD-10-CM | POA: Diagnosis not present

## 2019-08-10 DIAGNOSIS — M25611 Stiffness of right shoulder, not elsewhere classified: Secondary | ICD-10-CM | POA: Diagnosis not present

## 2019-08-15 DIAGNOSIS — M25611 Stiffness of right shoulder, not elsewhere classified: Secondary | ICD-10-CM | POA: Diagnosis not present

## 2019-08-15 DIAGNOSIS — M75121 Complete rotator cuff tear or rupture of right shoulder, not specified as traumatic: Secondary | ICD-10-CM | POA: Diagnosis not present

## 2019-08-17 DIAGNOSIS — J3081 Allergic rhinitis due to animal (cat) (dog) hair and dander: Secondary | ICD-10-CM | POA: Diagnosis not present

## 2019-08-17 DIAGNOSIS — J301 Allergic rhinitis due to pollen: Secondary | ICD-10-CM | POA: Diagnosis not present

## 2019-08-17 DIAGNOSIS — J3089 Other allergic rhinitis: Secondary | ICD-10-CM | POA: Diagnosis not present

## 2019-08-24 ENCOUNTER — Other Ambulatory Visit: Payer: Self-pay | Admitting: Family Medicine

## 2019-08-24 DIAGNOSIS — J301 Allergic rhinitis due to pollen: Secondary | ICD-10-CM | POA: Diagnosis not present

## 2019-08-24 DIAGNOSIS — J3081 Allergic rhinitis due to animal (cat) (dog) hair and dander: Secondary | ICD-10-CM | POA: Diagnosis not present

## 2019-08-24 DIAGNOSIS — J3089 Other allergic rhinitis: Secondary | ICD-10-CM | POA: Diagnosis not present

## 2019-08-24 DIAGNOSIS — M75121 Complete rotator cuff tear or rupture of right shoulder, not specified as traumatic: Secondary | ICD-10-CM | POA: Diagnosis not present

## 2019-08-24 DIAGNOSIS — M25611 Stiffness of right shoulder, not elsewhere classified: Secondary | ICD-10-CM | POA: Diagnosis not present

## 2019-08-24 NOTE — Telephone Encounter (Signed)
He needs follow up for labs.  I will refill once but needs to set up .

## 2019-08-24 NOTE — Telephone Encounter (Signed)
Last refill 05/29/2019 Last office visit 06/07/2019

## 2019-08-28 DIAGNOSIS — J3089 Other allergic rhinitis: Secondary | ICD-10-CM | POA: Diagnosis not present

## 2019-08-28 DIAGNOSIS — J301 Allergic rhinitis due to pollen: Secondary | ICD-10-CM | POA: Diagnosis not present

## 2019-08-28 DIAGNOSIS — J3081 Allergic rhinitis due to animal (cat) (dog) hair and dander: Secondary | ICD-10-CM | POA: Diagnosis not present

## 2019-09-01 DIAGNOSIS — M25611 Stiffness of right shoulder, not elsewhere classified: Secondary | ICD-10-CM | POA: Diagnosis not present

## 2019-09-01 DIAGNOSIS — M75121 Complete rotator cuff tear or rupture of right shoulder, not specified as traumatic: Secondary | ICD-10-CM | POA: Diagnosis not present

## 2019-09-04 ENCOUNTER — Telehealth: Payer: Self-pay | Admitting: Family Medicine

## 2019-09-04 MED ORDER — TESTOSTERONE CYPIONATE 200 MG/ML IM SOLN: INTRAMUSCULAR | 0 refills | Status: DC

## 2019-09-04 NOTE — Telephone Encounter (Signed)
Medication Refill: Testosterone INJ  Pharmacy: [facility] [address] Fax: [phone]   He has an appt at 2pm for the INJ so needs this called in before that time

## 2019-09-04 NOTE — Telephone Encounter (Signed)
Please see message. °

## 2019-09-04 NOTE — Telephone Encounter (Signed)
Kenneth Knapp from [facility] [address] said that the testosterone prescribed needs an prior authorization.    [facility] Phone: [phone]

## 2019-09-05 DIAGNOSIS — J3081 Allergic rhinitis due to animal (cat) (dog) hair and dander: Secondary | ICD-10-CM | POA: Diagnosis not present

## 2019-09-05 DIAGNOSIS — J3089 Other allergic rhinitis: Secondary | ICD-10-CM | POA: Diagnosis not present

## 2019-09-05 DIAGNOSIS — J301 Allergic rhinitis due to pollen: Secondary | ICD-10-CM | POA: Diagnosis not present

## 2019-09-05 NOTE — Telephone Encounter (Signed)
I have started the prior authorization through Cover My Meds. Key # [id number]  Called patient and let him know that the PA process has been started and hopefully we will have approval soon. Patient verbalized an understanding.

## 2019-09-05 NOTE — Telephone Encounter (Signed)
Pt call and stated he is waiting for a answer from megan he stated that he call yesterday and was told that someone would call him back ,but didn't here from anyone.

## 2019-09-06 DIAGNOSIS — Z09 Encounter for follow-up examination after completed treatment for conditions other than malignant neoplasm: Secondary | ICD-10-CM | POA: Diagnosis not present

## 2019-09-06 DIAGNOSIS — M75121 Complete rotator cuff tear or rupture of right shoulder, not specified as traumatic: Secondary | ICD-10-CM | POA: Diagnosis not present

## 2019-09-06 DIAGNOSIS — M25611 Stiffness of right shoulder, not elsewhere classified: Secondary | ICD-10-CM | POA: Diagnosis not present

## 2019-09-08 NOTE — Telephone Encounter (Signed)
Received approval for PA for testosterone effective 09/05/2019 through 07/19/2038

## 2019-09-13 ENCOUNTER — Encounter: Payer: Self-pay | Admitting: *Deleted

## 2019-09-13 NOTE — Telephone Encounter (Signed)
Pt stated that his pharmacy has not received the PA for testosterone and it keeps rejecting. Pt is frustrated on the back and forth for a month and would like a call from the CMA at [phone]  Did inform pt that the PA was approved per Megan's note but pt stated he was told the same thing but pharmacy is not able to fill it.

## 2019-09-13 NOTE — Telephone Encounter (Signed)
Spoke with Summer at the pharmacy and informed her that the PA was approved on 09/06/19 and is effective from 09/05/19 to 07/19/2038. Summer ran Rx while we were on the phone and verbalized that testosterone was approved. Patient notified.

## 2019-09-14 DIAGNOSIS — J3081 Allergic rhinitis due to animal (cat) (dog) hair and dander: Secondary | ICD-10-CM | POA: Diagnosis not present

## 2019-09-14 DIAGNOSIS — J301 Allergic rhinitis due to pollen: Secondary | ICD-10-CM | POA: Diagnosis not present

## 2019-09-14 DIAGNOSIS — J3089 Other allergic rhinitis: Secondary | ICD-10-CM | POA: Diagnosis not present

## 2019-09-20 DIAGNOSIS — J3089 Other allergic rhinitis: Secondary | ICD-10-CM | POA: Diagnosis not present

## 2019-09-20 DIAGNOSIS — J301 Allergic rhinitis due to pollen: Secondary | ICD-10-CM | POA: Diagnosis not present

## 2019-09-20 DIAGNOSIS — J3081 Allergic rhinitis due to animal (cat) (dog) hair and dander: Secondary | ICD-10-CM | POA: Diagnosis not present

## 2019-09-28 DIAGNOSIS — M75121 Complete rotator cuff tear or rupture of right shoulder, not specified as traumatic: Secondary | ICD-10-CM | POA: Diagnosis not present

## 2019-09-28 DIAGNOSIS — M25611 Stiffness of right shoulder, not elsewhere classified: Secondary | ICD-10-CM | POA: Diagnosis not present

## 2019-09-29 DIAGNOSIS — J3081 Allergic rhinitis due to animal (cat) (dog) hair and dander: Secondary | ICD-10-CM | POA: Diagnosis not present

## 2019-09-29 DIAGNOSIS — J301 Allergic rhinitis due to pollen: Secondary | ICD-10-CM | POA: Diagnosis not present

## 2019-09-29 DIAGNOSIS — J3089 Other allergic rhinitis: Secondary | ICD-10-CM | POA: Diagnosis not present

## 2019-10-05 DIAGNOSIS — J301 Allergic rhinitis due to pollen: Secondary | ICD-10-CM | POA: Diagnosis not present

## 2019-10-05 DIAGNOSIS — J3081 Allergic rhinitis due to animal (cat) (dog) hair and dander: Secondary | ICD-10-CM | POA: Diagnosis not present

## 2019-10-05 DIAGNOSIS — J3089 Other allergic rhinitis: Secondary | ICD-10-CM | POA: Diagnosis not present

## 2019-10-11 DIAGNOSIS — J301 Allergic rhinitis due to pollen: Secondary | ICD-10-CM | POA: Diagnosis not present

## 2019-10-11 DIAGNOSIS — J3089 Other allergic rhinitis: Secondary | ICD-10-CM | POA: Diagnosis not present

## 2019-10-11 DIAGNOSIS — J3081 Allergic rhinitis due to animal (cat) (dog) hair and dander: Secondary | ICD-10-CM | POA: Diagnosis not present

## 2019-10-19 ENCOUNTER — Other Ambulatory Visit: Payer: Self-pay | Admitting: Family Medicine

## 2019-10-19 DIAGNOSIS — J301 Allergic rhinitis due to pollen: Secondary | ICD-10-CM | POA: Diagnosis not present

## 2019-10-19 DIAGNOSIS — J3081 Allergic rhinitis due to animal (cat) (dog) hair and dander: Secondary | ICD-10-CM | POA: Diagnosis not present

## 2019-10-19 DIAGNOSIS — J3089 Other allergic rhinitis: Secondary | ICD-10-CM | POA: Diagnosis not present

## 2019-10-23 DIAGNOSIS — J3089 Other allergic rhinitis: Secondary | ICD-10-CM | POA: Diagnosis not present

## 2019-10-23 DIAGNOSIS — J3081 Allergic rhinitis due to animal (cat) (dog) hair and dander: Secondary | ICD-10-CM | POA: Diagnosis not present

## 2019-10-23 DIAGNOSIS — J301 Allergic rhinitis due to pollen: Secondary | ICD-10-CM | POA: Diagnosis not present

## 2019-10-27 ENCOUNTER — Other Ambulatory Visit: Payer: Self-pay | Admitting: Family Medicine

## 2019-10-30 DIAGNOSIS — J3081 Allergic rhinitis due to animal (cat) (dog) hair and dander: Secondary | ICD-10-CM | POA: Diagnosis not present

## 2019-10-30 DIAGNOSIS — J3089 Other allergic rhinitis: Secondary | ICD-10-CM | POA: Diagnosis not present

## 2019-10-30 DIAGNOSIS — J301 Allergic rhinitis due to pollen: Secondary | ICD-10-CM | POA: Diagnosis not present

## 2019-11-02 DIAGNOSIS — J3081 Allergic rhinitis due to animal (cat) (dog) hair and dander: Secondary | ICD-10-CM | POA: Diagnosis not present

## 2019-11-02 DIAGNOSIS — J301 Allergic rhinitis due to pollen: Secondary | ICD-10-CM | POA: Diagnosis not present

## 2019-11-02 DIAGNOSIS — J3089 Other allergic rhinitis: Secondary | ICD-10-CM | POA: Diagnosis not present

## 2019-11-07 DIAGNOSIS — J3081 Allergic rhinitis due to animal (cat) (dog) hair and dander: Secondary | ICD-10-CM | POA: Diagnosis not present

## 2019-11-07 DIAGNOSIS — J3089 Other allergic rhinitis: Secondary | ICD-10-CM | POA: Diagnosis not present

## 2019-11-07 DIAGNOSIS — J301 Allergic rhinitis due to pollen: Secondary | ICD-10-CM | POA: Diagnosis not present

## 2019-11-09 DIAGNOSIS — J3089 Other allergic rhinitis: Secondary | ICD-10-CM | POA: Diagnosis not present

## 2019-11-09 DIAGNOSIS — J3081 Allergic rhinitis due to animal (cat) (dog) hair and dander: Secondary | ICD-10-CM | POA: Diagnosis not present

## 2019-11-09 DIAGNOSIS — J301 Allergic rhinitis due to pollen: Secondary | ICD-10-CM | POA: Diagnosis not present

## 2019-11-13 DIAGNOSIS — J301 Allergic rhinitis due to pollen: Secondary | ICD-10-CM | POA: Diagnosis not present

## 2019-11-13 DIAGNOSIS — J3089 Other allergic rhinitis: Secondary | ICD-10-CM | POA: Diagnosis not present

## 2019-11-13 DIAGNOSIS — J3081 Allergic rhinitis due to animal (cat) (dog) hair and dander: Secondary | ICD-10-CM | POA: Diagnosis not present

## 2019-11-14 ENCOUNTER — Other Ambulatory Visit: Payer: Self-pay | Admitting: Family Medicine

## 2019-11-16 DIAGNOSIS — J3089 Other allergic rhinitis: Secondary | ICD-10-CM | POA: Diagnosis not present

## 2019-11-16 DIAGNOSIS — J3081 Allergic rhinitis due to animal (cat) (dog) hair and dander: Secondary | ICD-10-CM | POA: Diagnosis not present

## 2019-11-16 DIAGNOSIS — J301 Allergic rhinitis due to pollen: Secondary | ICD-10-CM | POA: Diagnosis not present

## 2019-11-19 ENCOUNTER — Other Ambulatory Visit: Payer: Self-pay | Admitting: Family Medicine

## 2019-11-20 ENCOUNTER — Encounter: Payer: Self-pay | Admitting: Family Medicine

## 2019-11-20 ENCOUNTER — Telehealth: Payer: Self-pay | Admitting: Family Medicine

## 2019-11-20 ENCOUNTER — Other Ambulatory Visit: Payer: Self-pay | Admitting: Family Medicine

## 2019-11-20 NOTE — Telephone Encounter (Signed)
Pt sent in a my-chart message for an annual physical, he is scheduled for June 1st. He stated that he had recent lab work done and can provide that information and ask if that will work for his physical?   Pt can be reached at [phone] or through [employer]

## 2019-11-20 NOTE — Telephone Encounter (Signed)
Spoke to pt and advised that he upload the labs to Mychart so that we can make sure he doesn't need anything else. Pt verbalized understanding.

## 2019-11-21 NOTE — Telephone Encounter (Signed)
Last refill 09/04/2019   Last ov 06/07/2019

## 2019-11-21 NOTE — Telephone Encounter (Signed)
Okay for refill?  

## 2019-11-21 NOTE — Telephone Encounter (Signed)
Okay for refill?   Pt has appt 12/19/2019

## 2019-11-21 NOTE — Telephone Encounter (Signed)
Medication Refill:  Zolpidem Testosterone Cypionate  Pharmacy: [facility] [address] FAX: [phone]

## 2019-11-22 NOTE — Telephone Encounter (Signed)
Spoke to pt and he stated that he get the injections every 10 days. However, pt stated that before this particular lab draw he has not had an injection in 23 days. Pt stated that he missed 1 injection.

## 2019-11-23 DIAGNOSIS — J301 Allergic rhinitis due to pollen: Secondary | ICD-10-CM | POA: Diagnosis not present

## 2019-11-23 DIAGNOSIS — J3089 Other allergic rhinitis: Secondary | ICD-10-CM | POA: Diagnosis not present

## 2019-11-23 DIAGNOSIS — J3081 Allergic rhinitis due to animal (cat) (dog) hair and dander: Secondary | ICD-10-CM | POA: Diagnosis not present

## 2019-12-01 DIAGNOSIS — J3081 Allergic rhinitis due to animal (cat) (dog) hair and dander: Secondary | ICD-10-CM | POA: Diagnosis not present

## 2019-12-01 DIAGNOSIS — J301 Allergic rhinitis due to pollen: Secondary | ICD-10-CM | POA: Diagnosis not present

## 2019-12-01 DIAGNOSIS — J3089 Other allergic rhinitis: Secondary | ICD-10-CM | POA: Diagnosis not present

## 2019-12-11 DIAGNOSIS — F4323 Adjustment disorder with mixed anxiety and depressed mood: Secondary | ICD-10-CM | POA: Diagnosis not present

## 2019-12-12 DIAGNOSIS — J3081 Allergic rhinitis due to animal (cat) (dog) hair and dander: Secondary | ICD-10-CM | POA: Diagnosis not present

## 2019-12-12 DIAGNOSIS — J301 Allergic rhinitis due to pollen: Secondary | ICD-10-CM | POA: Diagnosis not present

## 2019-12-12 DIAGNOSIS — J3089 Other allergic rhinitis: Secondary | ICD-10-CM | POA: Diagnosis not present

## 2019-12-14 ENCOUNTER — Other Ambulatory Visit: Payer: Self-pay | Admitting: Family Medicine

## 2019-12-14 DIAGNOSIS — F4323 Adjustment disorder with mixed anxiety and depressed mood: Secondary | ICD-10-CM | POA: Diagnosis not present

## 2019-12-19 ENCOUNTER — Ambulatory Visit (INDEPENDENT_AMBULATORY_CARE_PROVIDER_SITE_OTHER): Payer: BC Managed Care – PPO | Admitting: Family Medicine

## 2019-12-19 ENCOUNTER — Encounter: Payer: Self-pay | Admitting: Family Medicine

## 2019-12-19 ENCOUNTER — Other Ambulatory Visit: Payer: Self-pay

## 2019-12-19 VITALS — BP 110/60 | HR 82 | Temp 97.6°F | Ht 71.0 in | Wt 172.4 lb

## 2019-12-19 DIAGNOSIS — J3089 Other allergic rhinitis: Secondary | ICD-10-CM | POA: Diagnosis not present

## 2019-12-19 DIAGNOSIS — Z0000 Encounter for general adult medical examination without abnormal findings: Secondary | ICD-10-CM

## 2019-12-19 DIAGNOSIS — R7989 Other specified abnormal findings of blood chemistry: Secondary | ICD-10-CM | POA: Diagnosis not present

## 2019-12-19 DIAGNOSIS — J301 Allergic rhinitis due to pollen: Secondary | ICD-10-CM | POA: Diagnosis not present

## 2019-12-19 DIAGNOSIS — J3081 Allergic rhinitis due to animal (cat) (dog) hair and dander: Secondary | ICD-10-CM | POA: Diagnosis not present

## 2019-12-19 MED ORDER — TRAZODONE HCL 50 MG PO TABS: 25.0000 mg | ORAL_TABLET | Freq: Every evening | ORAL | 0 refills | Status: DC | PRN

## 2019-12-19 NOTE — Patient Instructions (Signed)
Set up Shingrix vaccine- schedule for nurse visit  Try the Trazodone for sleep and give me some feedback.

## 2019-12-19 NOTE — Progress Notes (Signed)
Subjective:     Patient ID: Kenneth Knapp, male   DOB: [birth year], 59 y.o.   MRN: [id number]  HPI   Kenneth Knapp is seen for physical exam.  His chronic problems include history of allergic rhinitis, hyperlipidemia, low testosterone.  His father had coronary disease in his 40s.  His father though apparently had other risk factors such as obesity.  He has history of low testosterone.  He had recent level that was extremely low at 91 but he had not gotten his injection on time and was couple weeks out from his usual injection.  He is on dosage of 300 mg every 10 days.  He had full set of labs through his workplace and these were reviewed.  His hematocrit was stable at 44.7.  PSA 2.0.  A1c 5.3%.  Cholesterol 133.  Other labs unremarkable.  He has battled with some chronic insomnia for some time.  He still wakes up with Ambien use and usually has to take a second dose early in the morning.  He is concerned about possible memory issues with long-term use.  Never tried trazodone.  Takes melatonin regularly.  Maintenance reviewed  -No history of shingles vaccine -Has received Covid vaccine x2 -Tetanus up-to-date -Due for repeat colonoscopy in 2 years  Past Medical History:  Diagnosis Date  . AR (allergic rhinitis)   . BPH (benign prostatic hyperplasia)   . ED (erectile dysfunction)   . History of echocardiogram    04-15-2016  mild LVH, ef 60-65%  . History of exercise stress test (03-22-2019 per pt had not any cardiac s&s since )   04-10-2016 (cardiology evaluation for precordial pain by dr t. Tresa Endo)  Low risk w/ low specificity (mild positive ekg changes at very high workload and brisk return to normal)  . Hyperlipidemia   . Hypogonadism male   . Rotator cuff tear, right   . Wears glasses    Past Surgical History:  Procedure Laterality Date  . ARTHOSCOPIC ROTAOR CUFF REPAIR Right 03/23/2019   Procedure: ARTHROSCOPIC ROTATOR CUFF REPAIR; SUBACROMIAL DECOMPRESSION; BICEP TENODISIS;   Surgeon: Jones Broom, MD;  Location: [facility];  Service: Orthopedics;  Laterality: Right;  90 MIN  . COLONOSCOPY  2013  . LUMBAR DISC SURGERY  2002   L4 -- L5  . MASS EXCISION Left 06/08/2017   Procedure: EXCISION CYST BUTTOCK;  Surgeon: Harriette Bouillon, MD;  Location: [facility];  Service: General;  Laterality: Left;  . WISDOM TOOTH EXTRACTION      reports that he has never smoked. He has never used smokeless tobacco. He reports current alcohol use. He reports that he does not use drugs. family history includes ALS in his mother; Arthritis in his mother; Colon cancer in his paternal grandmother; Heart disease in his father and paternal grandfather; Stroke (age of onset: 47) in his father. No Known Allergies   Review of Systems  Constitutional: Negative for activity change, appetite change, fatigue and fever.  HENT: Negative for congestion, ear pain and trouble swallowing.   Eyes: Negative for pain and visual disturbance.  Respiratory: Negative for cough, shortness of breath and wheezing.   Cardiovascular: Negative for chest pain and palpitations.  Gastrointestinal: Negative for abdominal distention, abdominal pain, blood in stool, constipation, diarrhea, nausea, rectal pain and vomiting.  Endocrine: Negative for polydipsia and polyuria.  Genitourinary: Negative for dysuria, hematuria and testicular pain.  Musculoskeletal: Negative for arthralgias and joint swelling.  Skin: Negative for rash.  Neurological: Negative for dizziness, syncope  and headaches.  Hematological: Negative for adenopathy.  Psychiatric/Behavioral: Negative for confusion and dysphoric mood.       Objective:   Physical Exam Constitutional:      General: He is not in acute distress.    Appearance: He is well-developed.  HENT:     Head: Normocephalic and atraumatic.     Right Ear: External ear normal.     Left Ear: External ear normal.  Eyes:     Conjunctiva/sclera:  Conjunctivae normal.     Pupils: Pupils are equal, round, and reactive to light.  Neck:     Thyroid: No thyromegaly.  Cardiovascular:     Rate and Rhythm: Normal rate and regular rhythm.     Heart sounds: Normal heart sounds. No murmur.  Pulmonary:     Effort: No respiratory distress.     Breath sounds: No wheezing or rales.  Abdominal:     General: Bowel sounds are normal. There is no distension.     Palpations: Abdomen is soft. There is no mass.     Tenderness: There is no abdominal tenderness. There is no guarding or rebound.  Musculoskeletal:     Cervical back: Normal range of motion and neck supple.     Right lower leg: No edema.     Left lower leg: No edema.  Lymphadenopathy:     Cervical: No cervical adenopathy.  Skin:    Findings: No rash.  Neurological:     Mental Status: He is alert and oriented to person, place, and time.     Cranial Nerves: No cranial nerve deficit.     Deep Tendon Reflexes: Reflexes normal.  Psychiatric:        Mood and Affect: Mood normal.        Assessment:     Physical exam.  We discussed the following health maintenance issues.  He has history of low testosterone with recent low level but he had been out of his medication for some time.  Hematocrit and PSA stable.  Chronic insomnia has been on chronic Ambien    Plan:     -We discussed trying trazodone 50 mg nightly and may titrate up by 25 mg every few days to 100 mg if not effective at 50 mg dose.  -If he responds well this will leave off the Ambien  -He will get repeat testosterone through his workplace soon  -We discussed Shingrix vaccine and he will look at scheduling this possibly on a Friday  -Repeat colonoscopy in 2 years  -Continue with annual flu vaccine  Eulas Post MD [facility]

## 2019-12-21 ENCOUNTER — Other Ambulatory Visit: Payer: Self-pay

## 2019-12-22 ENCOUNTER — Ambulatory Visit (INDEPENDENT_AMBULATORY_CARE_PROVIDER_SITE_OTHER): Payer: BC Managed Care – PPO

## 2019-12-22 DIAGNOSIS — Z23 Encounter for immunization: Secondary | ICD-10-CM | POA: Diagnosis not present

## 2019-12-25 ENCOUNTER — Other Ambulatory Visit: Payer: Self-pay | Admitting: Family Medicine

## 2019-12-25 ENCOUNTER — Encounter: Payer: Self-pay | Admitting: Family Medicine

## 2019-12-25 DIAGNOSIS — R42 Dizziness and giddiness: Secondary | ICD-10-CM

## 2019-12-26 DIAGNOSIS — J3089 Other allergic rhinitis: Secondary | ICD-10-CM | POA: Diagnosis not present

## 2019-12-26 DIAGNOSIS — J3081 Allergic rhinitis due to animal (cat) (dog) hair and dander: Secondary | ICD-10-CM | POA: Diagnosis not present

## 2019-12-26 DIAGNOSIS — J301 Allergic rhinitis due to pollen: Secondary | ICD-10-CM | POA: Diagnosis not present

## 2019-12-26 MED ORDER — LOVASTATIN 20 MG PO TABS: 20.0000 mg | ORAL_TABLET | Freq: Every day | ORAL | 1 refills | Status: DC

## 2019-12-26 NOTE — Telephone Encounter (Signed)
He sees Dr. Caryl Never

## 2020-01-01 DIAGNOSIS — F4323 Adjustment disorder with mixed anxiety and depressed mood: Secondary | ICD-10-CM | POA: Diagnosis not present

## 2020-01-02 DIAGNOSIS — J301 Allergic rhinitis due to pollen: Secondary | ICD-10-CM | POA: Diagnosis not present

## 2020-01-02 DIAGNOSIS — J3081 Allergic rhinitis due to animal (cat) (dog) hair and dander: Secondary | ICD-10-CM | POA: Diagnosis not present

## 2020-01-02 DIAGNOSIS — L738 Other specified follicular disorders: Secondary | ICD-10-CM | POA: Diagnosis not present

## 2020-01-02 DIAGNOSIS — L821 Other seborrheic keratosis: Secondary | ICD-10-CM | POA: Diagnosis not present

## 2020-01-02 DIAGNOSIS — J3089 Other allergic rhinitis: Secondary | ICD-10-CM | POA: Diagnosis not present

## 2020-01-02 DIAGNOSIS — L812 Freckles: Secondary | ICD-10-CM | POA: Diagnosis not present

## 2020-01-04 ENCOUNTER — Encounter: Payer: Self-pay | Admitting: Family Medicine

## 2020-01-09 DIAGNOSIS — J301 Allergic rhinitis due to pollen: Secondary | ICD-10-CM | POA: Diagnosis not present

## 2020-01-09 DIAGNOSIS — J3081 Allergic rhinitis due to animal (cat) (dog) hair and dander: Secondary | ICD-10-CM | POA: Diagnosis not present

## 2020-01-09 DIAGNOSIS — J3089 Other allergic rhinitis: Secondary | ICD-10-CM | POA: Diagnosis not present

## 2020-01-10 ENCOUNTER — Other Ambulatory Visit: Payer: Self-pay | Admitting: Family Medicine

## 2020-01-10 DIAGNOSIS — K140 Glossitis: Secondary | ICD-10-CM | POA: Insufficient documentation

## 2020-01-11 DIAGNOSIS — J301 Allergic rhinitis due to pollen: Secondary | ICD-10-CM | POA: Diagnosis not present

## 2020-01-12 DIAGNOSIS — J3089 Other allergic rhinitis: Secondary | ICD-10-CM | POA: Diagnosis not present

## 2020-01-12 DIAGNOSIS — J3081 Allergic rhinitis due to animal (cat) (dog) hair and dander: Secondary | ICD-10-CM | POA: Diagnosis not present

## 2020-01-14 ENCOUNTER — Other Ambulatory Visit: Payer: Self-pay | Admitting: Family Medicine

## 2020-01-15 NOTE — Telephone Encounter (Signed)
Please advise 

## 2020-01-17 DIAGNOSIS — J301 Allergic rhinitis due to pollen: Secondary | ICD-10-CM | POA: Diagnosis not present

## 2020-01-17 DIAGNOSIS — J3089 Other allergic rhinitis: Secondary | ICD-10-CM | POA: Diagnosis not present

## 2020-01-17 DIAGNOSIS — J3081 Allergic rhinitis due to animal (cat) (dog) hair and dander: Secondary | ICD-10-CM | POA: Diagnosis not present

## 2020-01-20 ENCOUNTER — Other Ambulatory Visit: Payer: Self-pay | Admitting: Family Medicine

## 2020-01-24 DIAGNOSIS — J3081 Allergic rhinitis due to animal (cat) (dog) hair and dander: Secondary | ICD-10-CM | POA: Diagnosis not present

## 2020-01-24 DIAGNOSIS — J301 Allergic rhinitis due to pollen: Secondary | ICD-10-CM | POA: Diagnosis not present

## 2020-01-24 DIAGNOSIS — J3089 Other allergic rhinitis: Secondary | ICD-10-CM | POA: Diagnosis not present

## 2020-01-29 DIAGNOSIS — J3089 Other allergic rhinitis: Secondary | ICD-10-CM | POA: Diagnosis not present

## 2020-01-29 DIAGNOSIS — J3081 Allergic rhinitis due to animal (cat) (dog) hair and dander: Secondary | ICD-10-CM | POA: Diagnosis not present

## 2020-01-29 DIAGNOSIS — J301 Allergic rhinitis due to pollen: Secondary | ICD-10-CM | POA: Diagnosis not present

## 2020-01-31 DIAGNOSIS — J301 Allergic rhinitis due to pollen: Secondary | ICD-10-CM | POA: Diagnosis not present

## 2020-01-31 DIAGNOSIS — J3081 Allergic rhinitis due to animal (cat) (dog) hair and dander: Secondary | ICD-10-CM | POA: Diagnosis not present

## 2020-01-31 DIAGNOSIS — J3089 Other allergic rhinitis: Secondary | ICD-10-CM | POA: Diagnosis not present

## 2020-02-05 DIAGNOSIS — J3089 Other allergic rhinitis: Secondary | ICD-10-CM | POA: Diagnosis not present

## 2020-02-05 DIAGNOSIS — J301 Allergic rhinitis due to pollen: Secondary | ICD-10-CM | POA: Diagnosis not present

## 2020-02-05 DIAGNOSIS — J3081 Allergic rhinitis due to animal (cat) (dog) hair and dander: Secondary | ICD-10-CM | POA: Diagnosis not present

## 2020-02-07 DIAGNOSIS — J3089 Other allergic rhinitis: Secondary | ICD-10-CM | POA: Diagnosis not present

## 2020-02-07 DIAGNOSIS — J3081 Allergic rhinitis due to animal (cat) (dog) hair and dander: Secondary | ICD-10-CM | POA: Diagnosis not present

## 2020-02-07 DIAGNOSIS — J301 Allergic rhinitis due to pollen: Secondary | ICD-10-CM | POA: Diagnosis not present

## 2020-02-12 DIAGNOSIS — J3081 Allergic rhinitis due to animal (cat) (dog) hair and dander: Secondary | ICD-10-CM | POA: Diagnosis not present

## 2020-02-12 DIAGNOSIS — J301 Allergic rhinitis due to pollen: Secondary | ICD-10-CM | POA: Diagnosis not present

## 2020-02-12 DIAGNOSIS — J3089 Other allergic rhinitis: Secondary | ICD-10-CM | POA: Diagnosis not present

## 2020-02-19 DIAGNOSIS — J3089 Other allergic rhinitis: Secondary | ICD-10-CM | POA: Diagnosis not present

## 2020-02-19 DIAGNOSIS — J301 Allergic rhinitis due to pollen: Secondary | ICD-10-CM | POA: Diagnosis not present

## 2020-02-19 DIAGNOSIS — J3081 Allergic rhinitis due to animal (cat) (dog) hair and dander: Secondary | ICD-10-CM | POA: Diagnosis not present

## 2020-02-27 ENCOUNTER — Ambulatory Visit: Payer: BC Managed Care – PPO | Admitting: Cardiovascular Disease

## 2020-02-27 ENCOUNTER — Other Ambulatory Visit: Payer: Self-pay

## 2020-02-27 ENCOUNTER — Encounter: Payer: Self-pay | Admitting: Family Medicine

## 2020-02-27 ENCOUNTER — Encounter: Payer: Self-pay | Admitting: Cardiovascular Disease

## 2020-02-27 DIAGNOSIS — R42 Dizziness and giddiness: Secondary | ICD-10-CM

## 2020-02-27 DIAGNOSIS — I440 Atrioventricular block, first degree: Secondary | ICD-10-CM | POA: Diagnosis not present

## 2020-02-27 DIAGNOSIS — I2510 Atherosclerotic heart disease of native coronary artery without angina pectoris: Secondary | ICD-10-CM | POA: Diagnosis not present

## 2020-02-27 DIAGNOSIS — E785 Hyperlipidemia, unspecified: Secondary | ICD-10-CM

## 2020-02-27 DIAGNOSIS — R7989 Other specified abnormal findings of blood chemistry: Secondary | ICD-10-CM

## 2020-02-27 NOTE — Telephone Encounter (Signed)
Lab results noted. 

## 2020-02-27 NOTE — Patient Instructions (Signed)
Medication Instructions:  CONTINUE WITH CURRENT MEDICATIONS. NO CHANGES.  *If you need a refill on your cardiac medications before your next appointment, please call your pharmacy*   Testing/Procedures: Your physician has requested that you have a carotid duplex. This test is an ultrasound of the carotid arteries in your neck. It looks at blood flow through these arteries that supply the brain with blood. Allow one hour for this exam. There are no restrictions or special instructions.   Follow-Up: AS NEEDED BASED ON RESULTS FROM TEST

## 2020-02-29 DIAGNOSIS — J3081 Allergic rhinitis due to animal (cat) (dog) hair and dander: Secondary | ICD-10-CM | POA: Diagnosis not present

## 2020-02-29 DIAGNOSIS — J301 Allergic rhinitis due to pollen: Secondary | ICD-10-CM | POA: Diagnosis not present

## 2020-02-29 DIAGNOSIS — J3089 Other allergic rhinitis: Secondary | ICD-10-CM | POA: Diagnosis not present

## 2020-03-03 ENCOUNTER — Encounter: Payer: Self-pay | Admitting: Cardiovascular Disease

## 2020-03-03 NOTE — Progress Notes (Signed)
Cardiology Office Note    Date:  03/03/2020   ID:  Kenneth Knapp, DOB 1961/01/01, MRN [id number]  PCP:  Eulas Post, MD  Cardiologist:  Shelva Majestic, MD   Chief Complaint  Patient presents with  . Dizziness     Reestablishment of cardiology care.  History of Present Illness:  Kenneth Knapp is a 59 y.o. male who I had seen in September 2017 when he was self-referred for evaluation of chest and left arm tingling with intermittent dizzy sensation.  Kenneth Knapp denies any known personal history of cardiac disease.  He has a family history for heart disease and his father had undergone 2 surgical operations for CABG revascularization and ultimately died at 57 following a hemorrhagic stroke.  Patient has a history of mild hyperlipidemia and when I saw him in 2017 he was on lovastatin 20 mg.  He had noticed rare intermittent episodes of a tingling sensation in his left chest and arm associated with transient dizziness.  He was able to exercise without any chest pain, palpitations, or visual blurring.  In 2006 he had undergone carotid duplex imaging which was normal.  During that evaluation, he was not orthostatic.  His symptom complex was atypical for CAD and he was unaware of any dysrhythmic or bradycardic episodes.  He underwent a 2D echo Doppler study on April 15, 2016 which showed an EF of 60 to 65% with mild LVH without wall motion abnormalities.  There was no defect or patent foramen ovale identified at the atrial septum.  He underwent a routine treadmill test where he developed asymptomatic upsloping ST depression during stage V of exercise with immediate recovery.  He had excellent exercise capacity at 16.4 METS.  Over the last 4 years, he has felt fairly well.  He continues to be active and exercises regularly.  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  Recently, he has again noticed occasional episodes of dizziness.  Initially had noticed perhaps some slight increase incidence  with excessive caffeine but he has significantly decreased caffeine use.  He denies any definitive constant trigger.  He is unaware of any pulse change.  His episode may last 10 minutes and then resolved.  Episode did not seem to be associated dehydration, EtOH use, or low blood sugar.  Since it may have been associated with mild dehydration and alcohol use.  He exercises regularly and denies any exertional precipitation.  He now presents to reestablish care and for evaluation of these recent symptoms.   Past Medical History:  Diagnosis Date  . AR (allergic rhinitis)   . BPH (benign prostatic hyperplasia)   . ED (erectile dysfunction)   . History of echocardiogram    04-15-2016  mild LVH, ef 60-65%  . History of exercise stress test (03-22-2019 per pt had not any cardiac s&s since )   04-10-2016 (cardiology evaluation for precordial pain by dr t. Claiborne Billings)  Low risk w/ low specificity (mild positive ekg changes at very high workload and brisk return to normal)  . Hyperlipidemia   . Hypogonadism male   . Rotator cuff tear, right   . Wears glasses     Past Surgical History:  Procedure Laterality Date  . ARTHOSCOPIC ROTAOR CUFF REPAIR Right 03/23/2019   Procedure: ARTHROSCOPIC ROTATOR CUFF REPAIR; SUBACROMIAL DECOMPRESSION; BICEP TENODISIS;  Surgeon: Tania Ade, MD;  Location: [facility];  Service: Orthopedics;  Laterality: Right;  90 MIN  . COLONOSCOPY  2013  . [place] SURGERY  2002  L4 -- L5  . MASS EXCISION Left 06/08/2017   Procedure: EXCISION CYST BUTTOCK;  Surgeon: Erroll Luna, MD;  Location: [facility];  Service: General;  Laterality: Left;  . WISDOM TOOTH EXTRACTION      Current Medications: Outpatient Medications Prior to Visit  Medication Sig Dispense Refill  . cyclobenzaprine (FLEXERIL) 10 MG tablet Take 10 mg by mouth as needed for muscle spasms.    . diclofenac (VOLTAREN) 75 MG EC tablet TAKE 1 TABLET BY MOUTH TWICE A DAY 180 tablet  0  . EPINEPHrine 0.3 mg/0.3 mL IJ SOAJ injection Use as needed    . fexofenadine (ALLEGRA) 180 MG tablet Take 180 mg by mouth at bedtime.     . lovastatin (MEVACOR) 20 MG tablet Take 1 tablet (20 mg total) by mouth at bedtime. 90 tablet 1  . Multiple Vitamin (MULTIVITAMIN) tablet Take 1 tablet by mouth daily.    Marland Kitchen PRESCRIPTION MEDICATION ALLERGY INJECTIONS WEEKLY    . sildenafil (VIAGRA) 50 MG tablet TAKE 1 TABLET BY MOUTH 1 HOUR PRIOR TO SEXUAL ACTIVITY 10 tablet 5  . testosterone cypionate (DEPOTESTOSTERONE CYPIONATE) 200 MG/ML injection INJECT 1.5 MILLILITERS INTO MUSCLE EVERY 10 DAYS 10 mL 0  . traZODone (DESYREL) 50 MG tablet TAKE 0.5-1 TABLETS (25-50 MG TOTAL) BY MOUTH AT BEDTIME AS NEEDED FOR SLEEP. 90 tablet 1  . zolpidem (AMBIEN) 5 MG tablet TAKE 1 TO 2 TABLETS BY MOUTH AT BEDTIME AS NEEDED FOR INSOMNIA 60 tablet 1  . cyclobenzaprine (FLEXERIL) 10 MG tablet Take 1 tablet (10 mg total) by mouth 3 (three) times daily. (Patient taking differently: Take 10 mg by mouth as needed. ) 30 tablet 0  . oxyCODONE-acetaminophen (PERCOCET) 5-325 MG tablet Take 1 tablet by mouth every 4 (four) hours as needed for severe pain. 20 tablet 0   No facility-administered medications prior to visit.     Allergies:   Patient has no known allergies.   Social History   Socioeconomic History  . Marital status: Married    Spouse name: Not on file  . Number of children: Not on file  . Years of education: Not on file  . Highest education level: Not on file  Occupational History  . Not on file  Tobacco Use  . Smoking status: Never Smoker  . Smokeless tobacco: Never Used  Vaping Use  . Vaping Use: Never used  Substance and Sexual Activity  . Alcohol use: Yes    Comment: 1 DRINK PER DAY  . Drug use: Never  . Sexual activity: Not on file  Other Topics Concern  . Not on file  Social History Narrative  . Not on file   Social Determinants of Health   Financial Resource Strain:   . Difficulty of  Paying Living Expenses:   Food Insecurity:   . Worried About Charity fundraiser in the Last Year:   . Arboriculturist in the Last Year:   Transportation Needs:   . Film/video editor (Medical):   Marland Kitchen Lack of Transportation (Non-Medical):   Physical Activity:   . Days of Exercise per Week:   . Minutes of Exercise per Session:   Stress:   . Feeling of Stress :   Social Connections:   . Frequency of Communication with Friends and Family:   . Frequency of Social Gatherings with Friends and Family:   . Attends Religious Services:   . Active Member of Clubs or Organizations:   . Attends Archivist  Meetings:   Marland Kitchen Marital Status:     He is married for over 35 years and has 2 children.  His sister is known to neuro who works at [employer].  Family History:  The patient's  family history includes ALS in his mother; Arthritis in his mother; Colon cancer in his paternal grandmother; Heart disease in his father and paternal grandfather; Stroke (age of onset: 64) in his father.  His mother died at age 26 with ALS.  Father died at age 14 after hemorrhagic stroke and had undergone 2 prior CABG revascularization surgeries with initial bypass at age 23.  ROS General: Negative; No fevers, chills, or night sweats;  HEENT: Negative; No changes in vision or hearing, sinus congestion, difficulty swallowing Pulmonary: Negative; No cough, wheezing, shortness of breath, hemoptysis Cardiovascular: Negative; No chest pain, presyncope, syncope, palpitations GI: Negative; No nausea, vomiting, diarrhea, or abdominal pain GU: Negative; No dysuria, hematuria, or difficulty voiding Musculoskeletal: Negative; no myalgias, joint pain, or weakness Hematologic/Oncology: Negative; no easy bruising, bleeding Endocrine: Negative; no heat/cold intolerance; no diabetes Neuro: Negative; no changes in balance, headaches; transient episodes of dizziness Skin: Negative; No rashes or skin  lesions Psychiatric: Negative; No behavioral problems, depression Sleep: Negative; No snoring, daytime sleepiness, hypersomnolence, bruxism, restless legs, hypnogognic hallucinations, no cataplexy Other comprehensive 14 point system review is negative.   PHYSICAL EXAM:   VS:  BP 102/70   Pulse 62   Ht '5\' 10"'  (1.778 m)   Wt 174 lb 6.4 oz (79.1 kg)   BMI 25.02 kg/m     When taken by the nurse, supine blood pressure 117/66 with a pulse of 63. Sitting blood pressure 110/63 with a pulse of 61 Standing blood pressure 111/62 with a pulse of 64 After 3 minutes, standing blood pressure 113/71 with a pulse of 69  Repeat blood pressure by me 116/72 supine and 116/70 standing  Wt Readings from Last 3 Encounters:  02/27/20 174 lb 6.4 oz (79.1 kg)  12/19/19 172 lb 6.4 oz (78.2 kg)  03/23/19 171 lb (77.6 kg)    General: Alert, oriented, no distress.  Skin: normal turgor, no rashes, warm and dry HEENT: Normocephalic, atraumatic. Pupils equal round and reactive to light; sclera anicteric; extraocular muscles intact;  Nose without nasal septal hypertrophy Mouth/Parynx benign; Mallinpatti scale 2 Neck: No JVD, no carotid bruits; normal carotid upstroke Lungs: clear to ausculatation and percussion; no wheezing or rales Chest wall: without tenderness to palpitation Heart: PMI not displaced, RRR, s1 s2 normal, 1/6 systolic murmur, no diastolic murmur, no rubs, gallops, thrills, or heaves Abdomen: soft, nontender; no hepatosplenomehaly, BS+; abdominal aorta nontender and not dilated by palpation. Back: no CVA tenderness Pulses 2+ Musculoskeletal: full range of motion, normal strength, no joint deformities Extremities: no clubbing cyanosis or edema, Homan's sign negative  Neurologic: grossly nonfocal; Cranial nerves grossly wnl Psychologic: Normal mood and affect   Studies/Labs Reviewed:   EKG:  EKG is ordered today.  ECG (independently read by me): Normal sinus rhythm at 62 bpm with mild  first-degree AV block PR interval at 257m.  No ectopy.  No ST segment changes.  QTc interval 365 seconds.  Recent Labs: BMP Latest Ref Rng & Units 08/27/2017 06/10/2016 04/06/2016  Glucose 70 - 99 mg/dL 111(H) 92 99  BUN 6 - 23 mg/dL '15 15 14  ' Creatinine 0.40 - 1.50 mg/dL 0.92 0.89 0.81  Sodium 135 - 145 mEq/L 138 140 140  Potassium 3.5 - 5.1 mEq/L 3.9 4.5 5.2  Chloride  96 - 112 mEq/L 103 104 104  CO2 19 - 32 mEq/L '28 28 31  ' Calcium 8.4 - 10.5 mg/dL 8.6 9.2 9.2     Hepatic Function Latest Ref Rng & Units 08/27/2017 06/10/2016 04/06/2016  Total Protein 6.0 - 8.3 g/dL 6.4 6.7 6.6  Albumin 3.5 - 5.2 g/dL 3.9 4.4 4.2  AST 0 - 37 U/L '21 25 31  ' ALT 0 - 53 U/L '21 31 31  ' Alk Phosphatase 39 - 117 U/L 61 69 62  Total Bilirubin 0.2 - 1.2 mg/dL 0.8 0.9 0.9  Bilirubin, Direct 0.0 - 0.3 mg/dL 0.2 0.1 -    CBC Latest Ref Rng & Units 08/04/2018 08/27/2017 06/10/2016  WBC 4.0 - 10.5 K/uL 7.2 8.4 7.8  Hemoglobin 13.0 - 17.0 g/dL 16.6 16.4 16.6  Hematocrit 39 - 52 % 48.5 47.2 48.8  Platelets 150 - 400 K/uL 235.0 229 233.0   Lab Results  Component Value Date   MCV 94.5 08/04/2018   MCV 90.1 08/27/2017   MCV 91.4 06/10/2016   Lab Results  Component Value Date   TSH 2.73 08/27/2017   No results found for: HGBA1C   BNP No results found for: BNP  ProBNP No results found for: PROBNP   Lipid Panel     Component Value Date/Time   CHOL 120 08/27/2017 0811   TRIG 68.0 08/27/2017 0811   HDL 41.10 08/27/2017 0811   CHOLHDL 3 08/27/2017 0811   VLDL 13.6 08/27/2017 0811   LDLCALC 66 08/27/2017 0811     RADIOLOGY: No results found.   Additional studies/ records that were reviewed today include:  I reviewed laboratory from February 20, 2020 done by [facility] at [facility]: Free testosterone 15.3 pg/mL and testosterone 708 ng/dl. Hemoglobin 13.6/hematocrit 44.1.  Glucose 59, creatinine 1.28.  At the time the patient was using meloxicam and which he had discontinued.  Prior glucoses were  intolerance, 88 in January, February, and April 2021 LFTs normal.   ASSESSMENT:    1. Dizziness   2. Hyperlipidemia, unspecified hyperlipidemia type   3. First degree AV block   4. Family history for CAD in father   57. Low testosterone: on replacement     PLAN:  Kenneth Knapp is a 59 year old gentleman who I had seen on 1 occasion in 2017 for evaluation of chest and left arm tingling with intermittent episodes of dizziness.  At that time an echo Doppler study showed an EF of 50 to 65% with mild H.  He had normal diastolic parameters and normal valves.  There was no evidence for any atrial septal defect.  Patent foramen ovale.  A routine treadmill test revealed excellent exercise tolerance he was able to exercise into stage V and achieved 61.4 METS.  There were nondiagnostic ST changes with immediate resolution.  Subsequently, he has continued to be active and exercises regularly without chest pain, exertional dyspnea and he denies any change in exercise capacity.  On exam today he is not orthostatic.  Blood pressure is normal and excellent by most recent guideline criteria.  He has experienced some transient episodes of dizziness which are not consistently precipitated by caffeine, not eating, dehydration or alcohol.  I did review recent laboratory.  Most labs have shown normal fasting glucose but his most recent laboratory on February 20, 2020 did show a low glucose at 59.  He sees Dr. Jearl Klinefelter for his primary care.  Presently, I have suggested perhaps that he undergo carotid duplex imaging to make  certain he has normal vertebral artery antegrade flow and no evidence for carotid disease.  His ECG shows stable sinus rhythm without ectopy or sinus arrhythmia.  There is very mild first-degree AV block with a PR interval at 214 ms. At this point, I do not believe he needs a monitor and he is unaware of any palpitations or heart rate irregularity.  In the future may be worthwhile to obtain a baseline  coronary calcium score in light of his family history for CAD with his father.  Presently he is entirely asymptomatic.  He is statin therapy with lovastatin. I have recommended he monitor his blood pressure heart rate fairly regularly and make certain he stays hydrated.  I will see him as needed in the future if problems arise.    Medication Adjustments/Labs and Tests Ordered: Current medicines are reviewed at length with the patient today.  Concerns regarding medicines are outlined above.  Medication changes, Labs and Tests ordered today are listed in the Patient Instructions below. Patient Instructions  Medication Instructions:  CONTINUE WITH CURRENT MEDICATIONS. NO CHANGES.  *If you need a refill on your cardiac medications before your next appointment, please call your pharmacy*   Testing/Procedures: Your physician has requested that you have a carotid duplex. This test is an ultrasound of the carotid arteries in your neck. It looks at blood flow through these arteries that supply the brain with blood. Allow one hour for this exam. There are no restrictions or special instructions.   Follow-Up: AS NEEDED BASED ON RESULTS FROM TEST      Signed, Shelva Majestic, MD  03/03/2020 10:35 AM    [facility] [address] [place], [place], [place]  [address] Phone: [phone]

## 2020-03-04 DIAGNOSIS — J301 Allergic rhinitis due to pollen: Secondary | ICD-10-CM | POA: Diagnosis not present

## 2020-03-04 DIAGNOSIS — J3089 Other allergic rhinitis: Secondary | ICD-10-CM | POA: Diagnosis not present

## 2020-03-04 DIAGNOSIS — J3081 Allergic rhinitis due to animal (cat) (dog) hair and dander: Secondary | ICD-10-CM | POA: Diagnosis not present

## 2020-03-11 DIAGNOSIS — J3081 Allergic rhinitis due to animal (cat) (dog) hair and dander: Secondary | ICD-10-CM | POA: Diagnosis not present

## 2020-03-11 DIAGNOSIS — J301 Allergic rhinitis due to pollen: Secondary | ICD-10-CM | POA: Diagnosis not present

## 2020-03-11 DIAGNOSIS — J3089 Other allergic rhinitis: Secondary | ICD-10-CM | POA: Diagnosis not present

## 2020-03-12 ENCOUNTER — Encounter: Payer: Self-pay | Admitting: Family Medicine

## 2020-03-14 DIAGNOSIS — Z20822 Contact with and (suspected) exposure to covid-19: Secondary | ICD-10-CM | POA: Diagnosis not present

## 2020-03-14 MED ORDER — TRAZODONE HCL 50 MG PO TABS: ORAL_TABLET | ORAL | 5 refills | Status: DC

## 2020-03-18 ENCOUNTER — Other Ambulatory Visit: Payer: Self-pay

## 2020-03-18 ENCOUNTER — Ambulatory Visit (HOSPITAL_COMMUNITY)
Admission: RE | Admit: 2020-03-18 | Discharge: 2020-03-18 | Disposition: A | Discharge status: Home / Self Care | Payer: BC Managed Care – PPO | Source: Ambulatory Visit | Attending: Cardiology | Admitting: Cardiology

## 2020-03-18 DIAGNOSIS — J3081 Allergic rhinitis due to animal (cat) (dog) hair and dander: Secondary | ICD-10-CM | POA: Diagnosis not present

## 2020-03-18 DIAGNOSIS — I2510 Atherosclerotic heart disease of native coronary artery without angina pectoris: Secondary | ICD-10-CM

## 2020-03-18 DIAGNOSIS — J301 Allergic rhinitis due to pollen: Secondary | ICD-10-CM | POA: Diagnosis not present

## 2020-03-18 DIAGNOSIS — J3089 Other allergic rhinitis: Secondary | ICD-10-CM | POA: Diagnosis not present

## 2020-03-27 ENCOUNTER — Other Ambulatory Visit: Payer: Self-pay | Admitting: Family Medicine

## 2020-03-27 DIAGNOSIS — J3081 Allergic rhinitis due to animal (cat) (dog) hair and dander: Secondary | ICD-10-CM | POA: Diagnosis not present

## 2020-03-27 DIAGNOSIS — J3089 Other allergic rhinitis: Secondary | ICD-10-CM | POA: Diagnosis not present

## 2020-03-27 DIAGNOSIS — J301 Allergic rhinitis due to pollen: Secondary | ICD-10-CM | POA: Diagnosis not present

## 2020-04-01 DIAGNOSIS — J3081 Allergic rhinitis due to animal (cat) (dog) hair and dander: Secondary | ICD-10-CM | POA: Diagnosis not present

## 2020-04-01 DIAGNOSIS — J301 Allergic rhinitis due to pollen: Secondary | ICD-10-CM | POA: Diagnosis not present

## 2020-04-01 DIAGNOSIS — J3089 Other allergic rhinitis: Secondary | ICD-10-CM | POA: Diagnosis not present

## 2020-04-04 DIAGNOSIS — Z20822 Contact with and (suspected) exposure to covid-19: Secondary | ICD-10-CM | POA: Diagnosis not present

## 2020-04-06 ENCOUNTER — Other Ambulatory Visit: Payer: Self-pay | Admitting: Family Medicine

## 2020-04-08 DIAGNOSIS — J3081 Allergic rhinitis due to animal (cat) (dog) hair and dander: Secondary | ICD-10-CM | POA: Diagnosis not present

## 2020-04-08 DIAGNOSIS — J301 Allergic rhinitis due to pollen: Secondary | ICD-10-CM | POA: Diagnosis not present

## 2020-04-08 DIAGNOSIS — J3089 Other allergic rhinitis: Secondary | ICD-10-CM | POA: Diagnosis not present

## 2020-04-16 DIAGNOSIS — J3089 Other allergic rhinitis: Secondary | ICD-10-CM | POA: Diagnosis not present

## 2020-04-16 DIAGNOSIS — J301 Allergic rhinitis due to pollen: Secondary | ICD-10-CM | POA: Diagnosis not present

## 2020-04-16 DIAGNOSIS — J3081 Allergic rhinitis due to animal (cat) (dog) hair and dander: Secondary | ICD-10-CM | POA: Diagnosis not present

## 2020-04-17 ENCOUNTER — Encounter: Payer: Self-pay | Admitting: Family Medicine

## 2020-04-22 DIAGNOSIS — J3081 Allergic rhinitis due to animal (cat) (dog) hair and dander: Secondary | ICD-10-CM | POA: Diagnosis not present

## 2020-04-22 DIAGNOSIS — J301 Allergic rhinitis due to pollen: Secondary | ICD-10-CM | POA: Diagnosis not present

## 2020-04-22 DIAGNOSIS — J3089 Other allergic rhinitis: Secondary | ICD-10-CM | POA: Diagnosis not present

## 2020-05-01 DIAGNOSIS — J301 Allergic rhinitis due to pollen: Secondary | ICD-10-CM | POA: Diagnosis not present

## 2020-05-01 DIAGNOSIS — J3089 Other allergic rhinitis: Secondary | ICD-10-CM | POA: Diagnosis not present

## 2020-05-01 DIAGNOSIS — J3081 Allergic rhinitis due to animal (cat) (dog) hair and dander: Secondary | ICD-10-CM | POA: Diagnosis not present

## 2020-05-06 DIAGNOSIS — J3081 Allergic rhinitis due to animal (cat) (dog) hair and dander: Secondary | ICD-10-CM | POA: Diagnosis not present

## 2020-05-06 DIAGNOSIS — J3089 Other allergic rhinitis: Secondary | ICD-10-CM | POA: Diagnosis not present

## 2020-05-06 DIAGNOSIS — J301 Allergic rhinitis due to pollen: Secondary | ICD-10-CM | POA: Diagnosis not present

## 2020-05-14 DIAGNOSIS — J3089 Other allergic rhinitis: Secondary | ICD-10-CM | POA: Diagnosis not present

## 2020-05-14 DIAGNOSIS — J301 Allergic rhinitis due to pollen: Secondary | ICD-10-CM | POA: Diagnosis not present

## 2020-05-14 DIAGNOSIS — J3081 Allergic rhinitis due to animal (cat) (dog) hair and dander: Secondary | ICD-10-CM | POA: Diagnosis not present

## 2020-05-16 ENCOUNTER — Other Ambulatory Visit: Payer: Self-pay

## 2020-05-16 ENCOUNTER — Telehealth (INDEPENDENT_AMBULATORY_CARE_PROVIDER_SITE_OTHER): Payer: BC Managed Care – PPO | Admitting: Family Medicine

## 2020-05-16 DIAGNOSIS — R0981 Nasal congestion: Secondary | ICD-10-CM

## 2020-05-16 DIAGNOSIS — R059 Cough, unspecified: Secondary | ICD-10-CM

## 2020-05-16 DIAGNOSIS — R519 Headache, unspecified: Secondary | ICD-10-CM | POA: Diagnosis not present

## 2020-05-16 MED ORDER — ALBUTEROL SULFATE HFA 108 (90 BASE) MCG/ACT IN AERS: 2.0000 | INHALATION_SPRAY | Freq: Four times a day (QID) | RESPIRATORY_TRACT | 0 refills | Status: DC | PRN

## 2020-05-16 MED ORDER — DOXYCYCLINE HYCLATE 100 MG PO TABS: 100.0000 mg | ORAL_TABLET | Freq: Two times a day (BID) | ORAL | 0 refills | Status: DC

## 2020-05-16 MED ORDER — BENZONATATE 100 MG PO CAPS: 100.0000 mg | ORAL_CAPSULE | Freq: Three times a day (TID) | ORAL | 0 refills | Status: DC | PRN

## 2020-05-16 NOTE — Patient Instructions (Signed)
   It was nice to meet you today, and I really hope you are feeling better soon. I help [facility] out with telemedicine visits on Tuesdays and Thursdays and am available for visits on those days. If you have any concerns or questions following this visit please schedule a follow up visit with your Primary Care doctor or seek care at a local urgent care clinic to avoid delays in care.    Seek in person care promptly if your symptoms worsen, new concerns arise or you are not improving with treatment. Call 911 and/or seek emergency care if you symptoms are severe or life threatening.   -stay home while sick, and if you have COVID19 please stay home for a full 10 days since the onset of symptoms PLUS one day of no fever and feeling better  -[facility] COVID19 testing information: [email or web address] OR [phone]  -I sent the medication(s) we discussed to your pharmacy: Meds ordered this encounter  Medications  . albuterol (PROAIR HFA) 108 (90 Base) MCG/ACT inhaler    Sig: Inhale 2 puffs into the lungs every 6 (six) hours as needed for wheezing or shortness of breath.    Dispense:  1 each    Refill:  0  . doxycycline (VIBRA-TABS) 100 MG tablet    Sig: Take 1 tablet (100 mg total) by mouth 2 (two) times daily.    Dispense:  20 tablet    Refill:  0  . benzonatate (TESSALON PERLES) 100 MG capsule    Sig: Take 1 capsule (100 mg total) by mouth 3 (three) times daily as needed.    Dispense:  20 capsule    Refill:  0    -call outpatient COVID19 treatment center [phone] if covid test is positive and you wish to do treatment -this is available if within 10 days of illness, you have risk factors and COVID test is positive  -can use tylenol or aleve if needed for fevers, aches and pains per instructions  -can use nasal saline a few times per day if nasal congestion  -stay hydrated, drink plenty of fluids and eat small healthy meals - avoid  dairy  -follow up with your doctor in 2-3 days unless improving and feeling better

## 2020-05-16 NOTE — Progress Notes (Signed)
Virtual Visit via Telephone Note  I connected with Kenneth Knapp on 05/16/20 at 11:00 AM EDT by telephone and verified that I am speaking with the correct person using two identifiers.   I discussed the limitations, risks, security and privacy concerns of performing an evaluation and management service by telephone and the availability of in person appointments. I also discussed with the patient that there may be a patient responsible charge related to this service. The patient expressed understanding and agreed to proceed.  Location patient: home Location provider: work or home office Participants present for the call: patient, provider Patient did not have a visit in the prior 7 days to address this/these issue(s).   History of Present Illness:  Acute telemedicine visit for : -Onset: about 7 days ago and getting worse -Symptoms include: nasal congestion, chest congestion, cough, thick yellow mucus, sore throat, glands in neck, sinus congestion, mild HAs sinus pain, some mild SOB on and off which he feels like is in the upper chest -has not tested for covid -Denies: fever, CP, wheezing, hemoptysis, vomiting, diarrhea, body aches, inability to get out of bed, eat or drink -Has tried: musinex -Pertinent past medical history: history of sinus infection and he is worried about this -Pertinent medication allergies: does have underlying allergies, on allergy shots, allergy medications -COVID-19 vaccine status: fully vaccinated for covid and flu   Observations/Objective: Patient sounds cheerful and well on the phone. I do not appreciate any SOB. Speech and thought processing are grossly intact. Patient reported vitals:  Assessment and Plan:  Cough  Nasal congestion  Facial discomfort  -we discussed possible serious and likely etiologies, options for evaluation and workup, limitations of telemedicine visit vs in person visit, treatment, treatment risks and precautions. Pt prefers  to treat via telemedicine empirically rather than in person at this moment.  Discussed numerous potential causes for his symptoms including but not limited to mild influenza, viral upper respiratory infection, bronchitis, lower respiratory infection, possible developing sinusitis or bacterial infection versus other.  Discussed possibility of breakthrough Covid case.  He feels strongly that this is a bacterial sinus infection, as he has had this in the past.  Discussed nasal saline, albuterol if any further shortness of breath or bronchitis type symptoms, cough medication and provided antibiotic for delayed use as well if any worsening or if not improving over the next several days - doxycycline 100 mg twice daily for 7 to 10 days.  Discussed Covid testing and treatment, potential complications, precautions and staying home while sick. Work/School slipped offered:declined Scheduled follow up with PCP offered: He agrees to follow-up if needed. Advised to seek prompt in person care if worsening, new symptoms arise, or if is not improving with treatment. Advised of options for inperson care in case PCP office not available. Did let the patient know that I only do telemedicine shifts for [facility] on Tuesdays and Thursdays and advised a follow up visit with PCP or at an [facility] if has further questions or concerns.   Follow Up Instructions:  I did not refer this patient for an OV in the next 24 hours for this/these issue(s).  I discussed the assessment and treatment plan with the patient. The patient was provided an opportunity to ask questions and all were answered. The patient agreed with the plan and demonstrated an understanding of the instructions.   Over 20 minutes was spent on this encounter.  Terressa Koyanagi, DO

## 2020-05-17 ENCOUNTER — Other Ambulatory Visit: Payer: Self-pay

## 2020-05-17 ENCOUNTER — Ambulatory Visit (INDEPENDENT_AMBULATORY_CARE_PROVIDER_SITE_OTHER): Payer: BC Managed Care – PPO | Admitting: *Deleted

## 2020-05-17 DIAGNOSIS — Z23 Encounter for immunization: Secondary | ICD-10-CM | POA: Diagnosis not present

## 2020-05-17 NOTE — Progress Notes (Signed)
Patient in for Shingrix dose #2. Vaccine administered with no immediate reaction.

## 2020-05-20 DIAGNOSIS — J3081 Allergic rhinitis due to animal (cat) (dog) hair and dander: Secondary | ICD-10-CM | POA: Diagnosis not present

## 2020-05-20 DIAGNOSIS — J3089 Other allergic rhinitis: Secondary | ICD-10-CM | POA: Diagnosis not present

## 2020-05-20 DIAGNOSIS — J301 Allergic rhinitis due to pollen: Secondary | ICD-10-CM | POA: Diagnosis not present

## 2020-05-21 ENCOUNTER — Other Ambulatory Visit: Payer: Self-pay | Admitting: Family Medicine

## 2020-05-28 ENCOUNTER — Other Ambulatory Visit: Payer: Self-pay | Admitting: Family Medicine

## 2020-05-28 DIAGNOSIS — J3081 Allergic rhinitis due to animal (cat) (dog) hair and dander: Secondary | ICD-10-CM | POA: Diagnosis not present

## 2020-05-28 DIAGNOSIS — J3089 Other allergic rhinitis: Secondary | ICD-10-CM | POA: Diagnosis not present

## 2020-05-28 DIAGNOSIS — J301 Allergic rhinitis due to pollen: Secondary | ICD-10-CM | POA: Diagnosis not present

## 2020-06-06 ENCOUNTER — Other Ambulatory Visit: Payer: Self-pay | Admitting: Family Medicine

## 2020-06-06 DIAGNOSIS — J3089 Other allergic rhinitis: Secondary | ICD-10-CM | POA: Diagnosis not present

## 2020-06-06 DIAGNOSIS — J3081 Allergic rhinitis due to animal (cat) (dog) hair and dander: Secondary | ICD-10-CM | POA: Diagnosis not present

## 2020-06-06 DIAGNOSIS — J301 Allergic rhinitis due to pollen: Secondary | ICD-10-CM | POA: Diagnosis not present

## 2020-06-12 DIAGNOSIS — J3081 Allergic rhinitis due to animal (cat) (dog) hair and dander: Secondary | ICD-10-CM | POA: Diagnosis not present

## 2020-06-12 DIAGNOSIS — J3089 Other allergic rhinitis: Secondary | ICD-10-CM | POA: Diagnosis not present

## 2020-06-12 DIAGNOSIS — J301 Allergic rhinitis due to pollen: Secondary | ICD-10-CM | POA: Diagnosis not present

## 2020-06-17 ENCOUNTER — Other Ambulatory Visit: Payer: Self-pay | Admitting: Family Medicine

## 2020-06-21 DIAGNOSIS — J3081 Allergic rhinitis due to animal (cat) (dog) hair and dander: Secondary | ICD-10-CM | POA: Diagnosis not present

## 2020-06-21 DIAGNOSIS — J3089 Other allergic rhinitis: Secondary | ICD-10-CM | POA: Diagnosis not present

## 2020-06-21 DIAGNOSIS — J301 Allergic rhinitis due to pollen: Secondary | ICD-10-CM | POA: Diagnosis not present

## 2020-06-24 DIAGNOSIS — J301 Allergic rhinitis due to pollen: Secondary | ICD-10-CM | POA: Diagnosis not present

## 2020-06-24 DIAGNOSIS — J3089 Other allergic rhinitis: Secondary | ICD-10-CM | POA: Diagnosis not present

## 2020-06-24 DIAGNOSIS — J3081 Allergic rhinitis due to animal (cat) (dog) hair and dander: Secondary | ICD-10-CM | POA: Diagnosis not present

## 2020-06-26 DIAGNOSIS — J301 Allergic rhinitis due to pollen: Secondary | ICD-10-CM | POA: Diagnosis not present

## 2020-06-26 DIAGNOSIS — J3081 Allergic rhinitis due to animal (cat) (dog) hair and dander: Secondary | ICD-10-CM | POA: Diagnosis not present

## 2020-06-26 DIAGNOSIS — J3089 Other allergic rhinitis: Secondary | ICD-10-CM | POA: Diagnosis not present

## 2020-07-01 DIAGNOSIS — J3089 Other allergic rhinitis: Secondary | ICD-10-CM | POA: Diagnosis not present

## 2020-07-01 DIAGNOSIS — J301 Allergic rhinitis due to pollen: Secondary | ICD-10-CM | POA: Diagnosis not present

## 2020-07-01 DIAGNOSIS — J3081 Allergic rhinitis due to animal (cat) (dog) hair and dander: Secondary | ICD-10-CM | POA: Diagnosis not present

## 2020-07-03 DIAGNOSIS — J301 Allergic rhinitis due to pollen: Secondary | ICD-10-CM | POA: Diagnosis not present

## 2020-07-03 DIAGNOSIS — J3081 Allergic rhinitis due to animal (cat) (dog) hair and dander: Secondary | ICD-10-CM | POA: Diagnosis not present

## 2020-07-03 DIAGNOSIS — J3089 Other allergic rhinitis: Secondary | ICD-10-CM | POA: Diagnosis not present

## 2020-07-09 DIAGNOSIS — J3089 Other allergic rhinitis: Secondary | ICD-10-CM | POA: Diagnosis not present

## 2020-07-09 DIAGNOSIS — J301 Allergic rhinitis due to pollen: Secondary | ICD-10-CM | POA: Diagnosis not present

## 2020-07-09 DIAGNOSIS — J3081 Allergic rhinitis due to animal (cat) (dog) hair and dander: Secondary | ICD-10-CM | POA: Diagnosis not present

## 2020-07-15 DIAGNOSIS — J301 Allergic rhinitis due to pollen: Secondary | ICD-10-CM | POA: Diagnosis not present

## 2020-07-15 DIAGNOSIS — J3081 Allergic rhinitis due to animal (cat) (dog) hair and dander: Secondary | ICD-10-CM | POA: Diagnosis not present

## 2020-07-15 DIAGNOSIS — J3089 Other allergic rhinitis: Secondary | ICD-10-CM | POA: Diagnosis not present

## 2020-07-25 DIAGNOSIS — J3089 Other allergic rhinitis: Secondary | ICD-10-CM | POA: Diagnosis not present

## 2020-07-25 DIAGNOSIS — J301 Allergic rhinitis due to pollen: Secondary | ICD-10-CM | POA: Diagnosis not present

## 2020-07-25 DIAGNOSIS — J3081 Allergic rhinitis due to animal (cat) (dog) hair and dander: Secondary | ICD-10-CM | POA: Diagnosis not present

## 2020-07-29 DIAGNOSIS — J3081 Allergic rhinitis due to animal (cat) (dog) hair and dander: Secondary | ICD-10-CM | POA: Diagnosis not present

## 2020-07-29 DIAGNOSIS — J301 Allergic rhinitis due to pollen: Secondary | ICD-10-CM | POA: Diagnosis not present

## 2020-07-29 DIAGNOSIS — J3089 Other allergic rhinitis: Secondary | ICD-10-CM | POA: Diagnosis not present

## 2020-08-08 DIAGNOSIS — J3089 Other allergic rhinitis: Secondary | ICD-10-CM | POA: Diagnosis not present

## 2020-08-08 DIAGNOSIS — J3081 Allergic rhinitis due to animal (cat) (dog) hair and dander: Secondary | ICD-10-CM | POA: Diagnosis not present

## 2020-08-08 DIAGNOSIS — J301 Allergic rhinitis due to pollen: Secondary | ICD-10-CM | POA: Diagnosis not present

## 2020-08-12 DIAGNOSIS — J3089 Other allergic rhinitis: Secondary | ICD-10-CM | POA: Diagnosis not present

## 2020-08-12 DIAGNOSIS — J3081 Allergic rhinitis due to animal (cat) (dog) hair and dander: Secondary | ICD-10-CM | POA: Diagnosis not present

## 2020-08-12 DIAGNOSIS — J301 Allergic rhinitis due to pollen: Secondary | ICD-10-CM | POA: Diagnosis not present

## 2020-08-15 ENCOUNTER — Other Ambulatory Visit: Payer: Self-pay | Admitting: Family Medicine

## 2020-08-20 DIAGNOSIS — J301 Allergic rhinitis due to pollen: Secondary | ICD-10-CM | POA: Diagnosis not present

## 2020-08-20 DIAGNOSIS — J3089 Other allergic rhinitis: Secondary | ICD-10-CM | POA: Diagnosis not present

## 2020-08-20 DIAGNOSIS — J3081 Allergic rhinitis due to animal (cat) (dog) hair and dander: Secondary | ICD-10-CM | POA: Diagnosis not present

## 2020-08-26 DIAGNOSIS — J301 Allergic rhinitis due to pollen: Secondary | ICD-10-CM | POA: Diagnosis not present

## 2020-08-26 DIAGNOSIS — J3089 Other allergic rhinitis: Secondary | ICD-10-CM | POA: Diagnosis not present

## 2020-08-26 DIAGNOSIS — J3081 Allergic rhinitis due to animal (cat) (dog) hair and dander: Secondary | ICD-10-CM | POA: Diagnosis not present

## 2020-08-31 IMAGING — MR MRI OF THE RIGHT SHOULDER WITHOUT CONTRAST
4 of 5 series · 19 of 40 positions shown · non-contrast
Comparison: None.

CLINICAL DATA: Right shoulder pain with decreased range of motion
for 3 months. Temporary relief from shoulder injection. No acute
injury or prior relevant surgery.

EXAM:
MRI OF THE RIGHT SHOULDER WITHOUT CONTRAST
TECHNIQUE: Multiplanar, multisequence MR imaging of the shoulder was performed.
No intravenous contrast was administered.

[Series 6: PD fat-sat · axial · right · 4.0mm · 0.44mm/px · z∈[-9,+81]mm · 6 of 20 slices shown (1 of 2)]
[im 1/20]
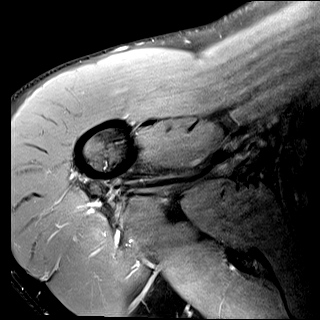
[im 4/20]
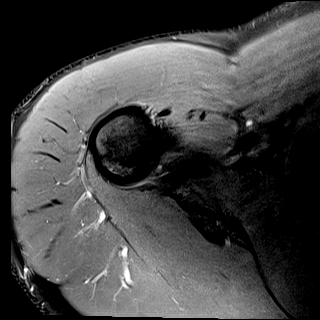
[im 8/20]
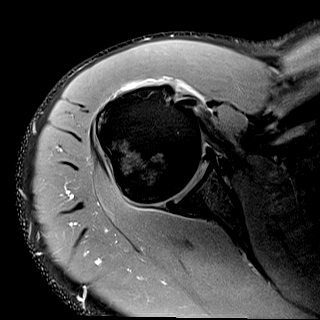
[im 12/20]
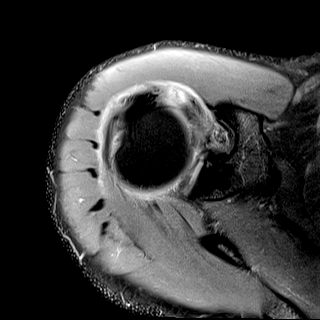
[im 16/20]
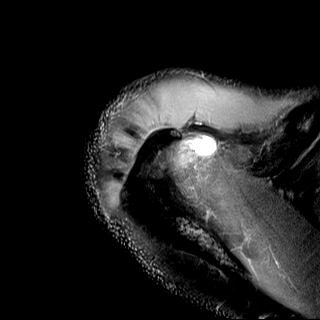
[im 20/20]
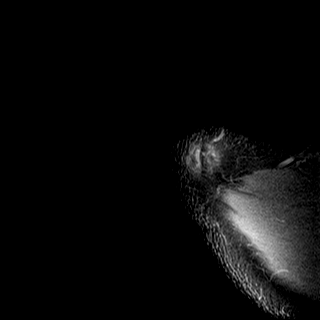

[Series 7: T2 fat-sat · oblique · right · 4.0mm · 0.22mm/px · 3 of 21 slices shown (1 of 2)]
[im 3/21]
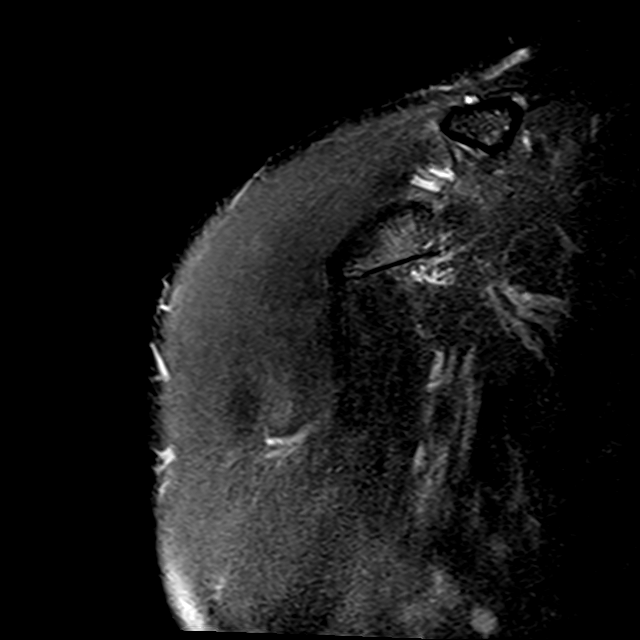
[im 12/21]
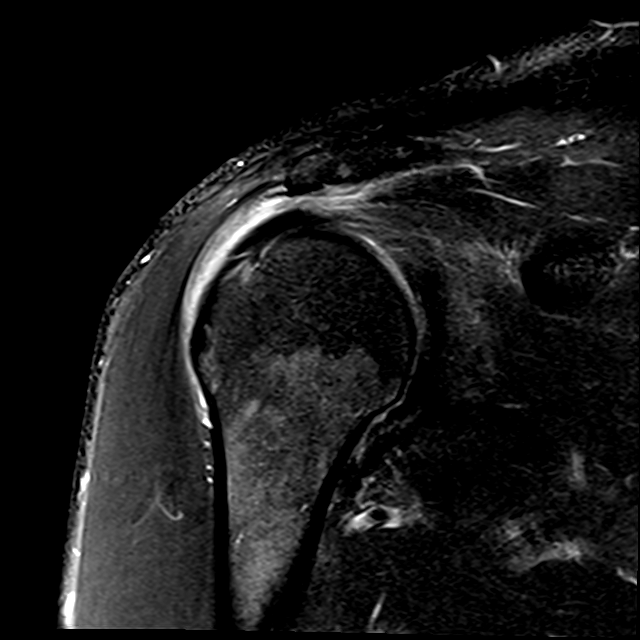
[im 18/21]
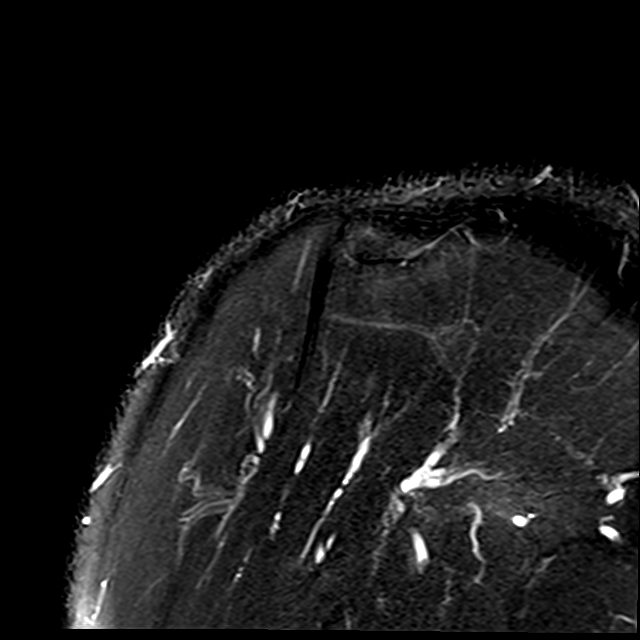

[Series 8: PD fat-sat · oblique · right · 4.0mm · 0.22mm/px · 7 of 21 slices shown (2 of 2)]
[im 1/21]
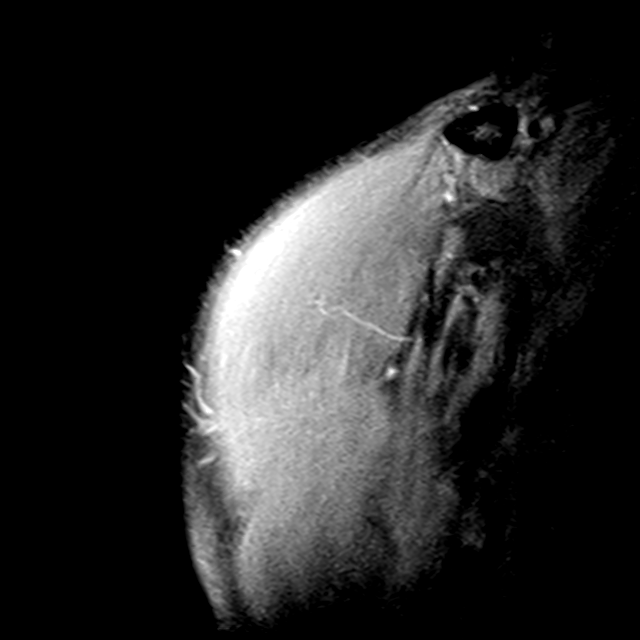
[im 3/21]
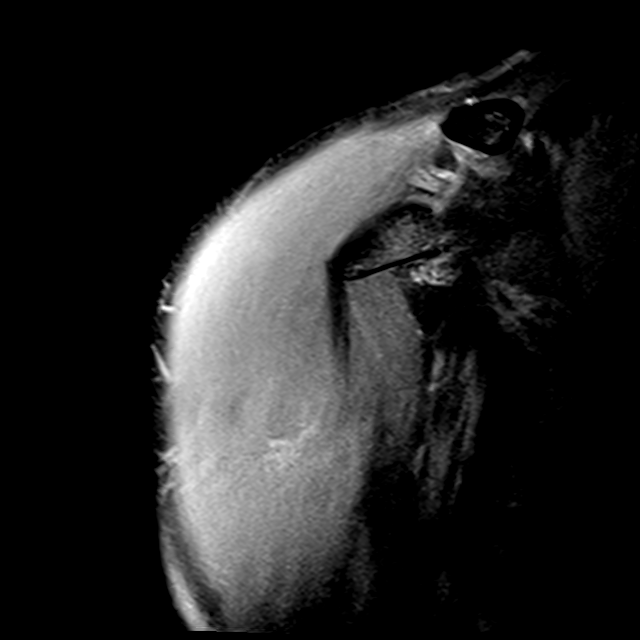
[im 6/21]
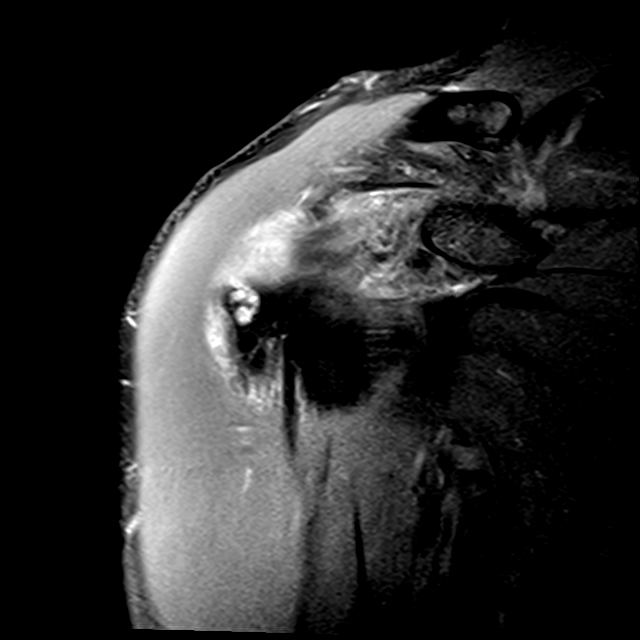
[im 9/21]
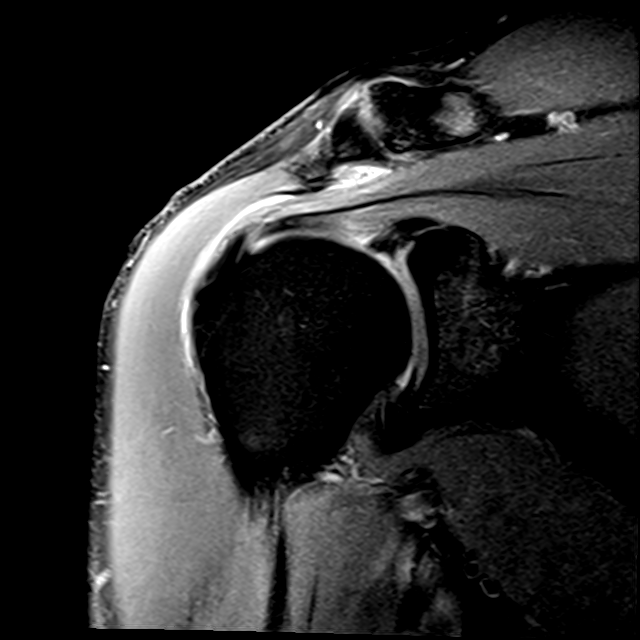
[im 12/21]
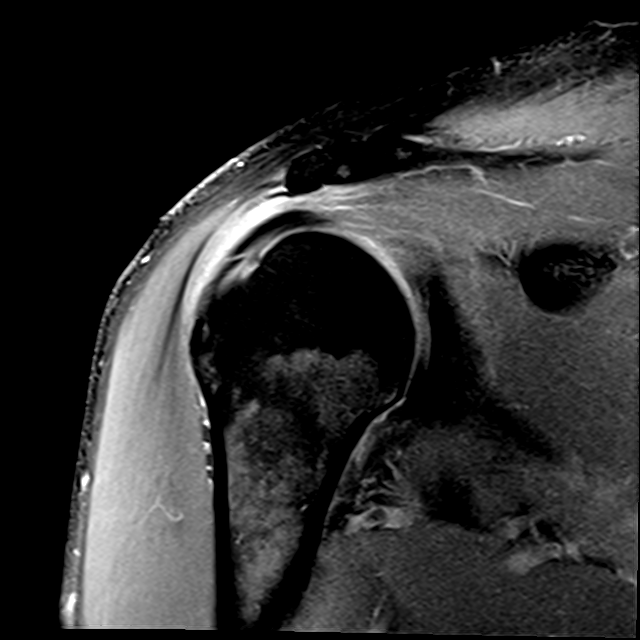
[im 15/21]
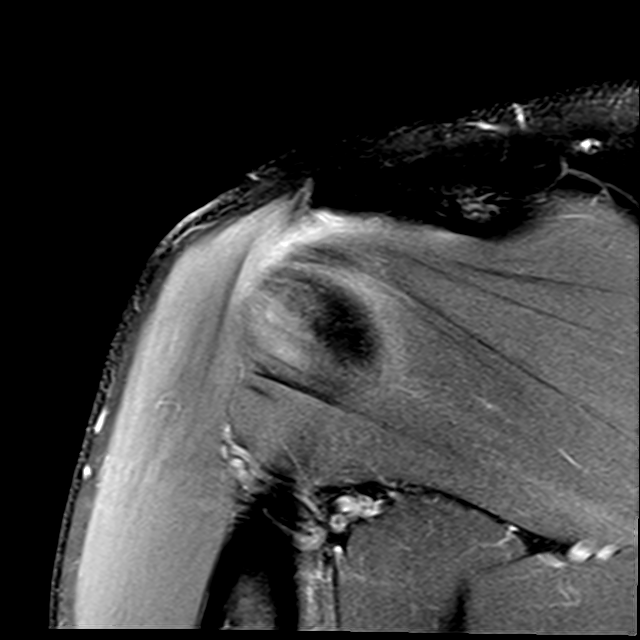
[im 18/21]
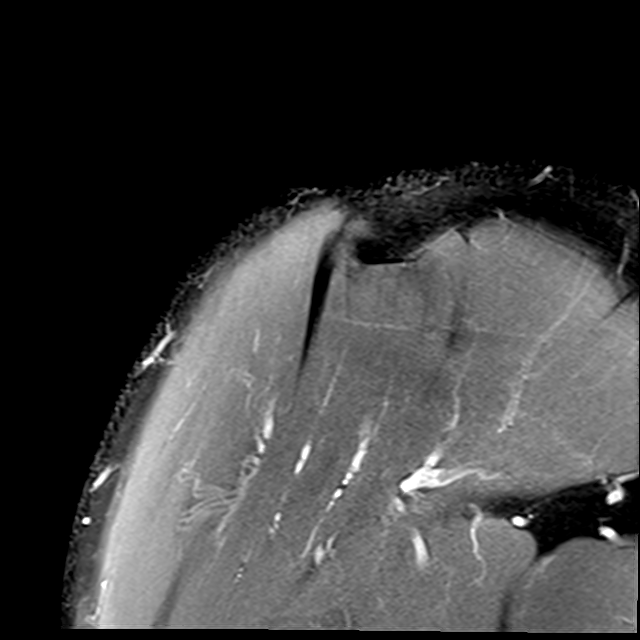

[Series 9: T2 fat-sat · oblique · right · 4.0mm · 0.44mm/px · 3 of 23 slices shown (2 of 2)]
[im 3/23]
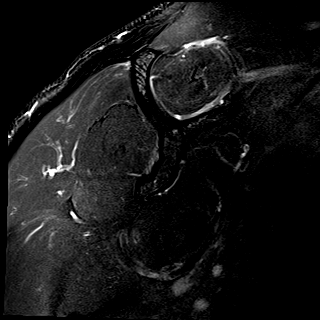
[im 12/23]
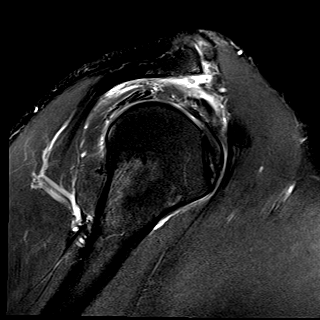
[im 20/23]
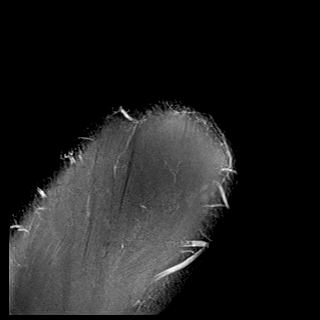

[19 of 40 positions shown; findings below may reference images not displayed]

FINDINGS: Rotator cuff: There is supraspinatus and subscapularis tendinosis
with bursal surface fraying. There is a possible small
full-thickness insertional tear along the anterior aspect of the
supraspinatus tendon, best seen on coronal image [DATE]. There is no
tendon retraction. There is also partial tearing of the
subscapularis tendon, allowing medial subluxation of the biceps
tendon into its substance. The infraspinatus and teres minor tendons
are intact.

Muscles:  No focal muscular atrophy or edema.

Biceps long head: Intact. As above, the tendon is slightly subluxed
medially from the superior aspect of the bicipital groove.

Acromioclavicular Joint: The acromion is type 2. There are moderate
acromioclavicular degenerative changes. There is a small to moderate
amount of complex fluid anteriorly in the subacromial-subdeltoid
bursa.

Glenohumeral Joint: No significant shoulder joint effusion or
glenohumeral arthropathy.

Labrum: No evidence of labral tear or paralabral cyst. Possible mild
synovial thickening along the anterior inferior glenoid.

Bones: No acute or significant extra-articular osseous findings.
There is subcortical cyst formation in the greater tuberosity at the
supraspinatus insertion.

Other: No significant soft tissue findings.
IMPRESSION: 1. Anterior rotator cuff disease with moderate supraspinatus
tendinosis, bursal surface fraying and probable small full-thickness
insertional tear anteriorly. No tendon retraction or muscular
atrophy.
2. Subscapularis tendinosis with partial articular surface tearing
allowing slight medial subluxation of the biceps tendon from the
bicipital groove.
3. Moderate acromioclavicular degenerative changes and a small to
moderate amount of complex fluid in the subacromial-subdeltoid
bursa.
4. The biceps tendon and labrum appear intact.

## 2020-09-03 ENCOUNTER — Telehealth: Payer: BC Managed Care – PPO | Admitting: Physician Assistant

## 2020-09-03 DIAGNOSIS — J301 Allergic rhinitis due to pollen: Secondary | ICD-10-CM | POA: Diagnosis not present

## 2020-09-03 DIAGNOSIS — J3081 Allergic rhinitis due to animal (cat) (dog) hair and dander: Secondary | ICD-10-CM | POA: Diagnosis not present

## 2020-09-03 DIAGNOSIS — J019 Acute sinusitis, unspecified: Secondary | ICD-10-CM

## 2020-09-03 DIAGNOSIS — J3089 Other allergic rhinitis: Secondary | ICD-10-CM | POA: Diagnosis not present

## 2020-09-03 MED ORDER — DOXYCYCLINE HYCLATE 100 MG PO CAPS: 100.0000 mg | ORAL_CAPSULE | Freq: Two times a day (BID) | ORAL | 0 refills | Status: DC

## 2020-09-03 NOTE — Progress Notes (Signed)

## 2020-09-11 DIAGNOSIS — J301 Allergic rhinitis due to pollen: Secondary | ICD-10-CM | POA: Diagnosis not present

## 2020-09-11 DIAGNOSIS — J3081 Allergic rhinitis due to animal (cat) (dog) hair and dander: Secondary | ICD-10-CM | POA: Diagnosis not present

## 2020-09-11 DIAGNOSIS — J3089 Other allergic rhinitis: Secondary | ICD-10-CM | POA: Diagnosis not present

## 2020-09-13 ENCOUNTER — Other Ambulatory Visit: Payer: Self-pay | Admitting: Family Medicine

## 2020-09-17 ENCOUNTER — Encounter: Payer: Self-pay | Admitting: Family Medicine

## 2020-09-17 ENCOUNTER — Other Ambulatory Visit: Payer: Self-pay | Admitting: Family Medicine

## 2020-09-17 DIAGNOSIS — J3089 Other allergic rhinitis: Secondary | ICD-10-CM | POA: Diagnosis not present

## 2020-09-17 DIAGNOSIS — J301 Allergic rhinitis due to pollen: Secondary | ICD-10-CM | POA: Diagnosis not present

## 2020-09-17 DIAGNOSIS — J3081 Allergic rhinitis due to animal (cat) (dog) hair and dander: Secondary | ICD-10-CM | POA: Diagnosis not present

## 2020-09-17 NOTE — Telephone Encounter (Signed)
Last refill- 06/18/2020 No future appointment scheduled

## 2020-09-18 MED ORDER — TESTOSTERONE CYPIONATE 200 MG/ML IM SOLN: INTRAMUSCULAR | 1 refills | Status: DC

## 2020-09-18 NOTE — Telephone Encounter (Signed)
Please send in refill and prior auth will be initiated.

## 2020-09-18 NOTE — Telephone Encounter (Signed)
Please initiate prior authorization.

## 2020-09-21 ENCOUNTER — Other Ambulatory Visit: Payer: Self-pay | Admitting: Family Medicine

## 2020-09-24 DIAGNOSIS — J3081 Allergic rhinitis due to animal (cat) (dog) hair and dander: Secondary | ICD-10-CM | POA: Diagnosis not present

## 2020-09-24 DIAGNOSIS — J3089 Other allergic rhinitis: Secondary | ICD-10-CM | POA: Diagnosis not present

## 2020-09-24 DIAGNOSIS — J301 Allergic rhinitis due to pollen: Secondary | ICD-10-CM | POA: Diagnosis not present

## 2020-10-01 DIAGNOSIS — J3089 Other allergic rhinitis: Secondary | ICD-10-CM | POA: Diagnosis not present

## 2020-10-01 DIAGNOSIS — J301 Allergic rhinitis due to pollen: Secondary | ICD-10-CM | POA: Diagnosis not present

## 2020-10-01 DIAGNOSIS — J3081 Allergic rhinitis due to animal (cat) (dog) hair and dander: Secondary | ICD-10-CM | POA: Diagnosis not present

## 2020-10-07 DIAGNOSIS — J3089 Other allergic rhinitis: Secondary | ICD-10-CM | POA: Diagnosis not present

## 2020-10-07 DIAGNOSIS — J3081 Allergic rhinitis due to animal (cat) (dog) hair and dander: Secondary | ICD-10-CM | POA: Diagnosis not present

## 2020-10-07 DIAGNOSIS — J301 Allergic rhinitis due to pollen: Secondary | ICD-10-CM | POA: Diagnosis not present

## 2020-10-14 DIAGNOSIS — J301 Allergic rhinitis due to pollen: Secondary | ICD-10-CM | POA: Diagnosis not present

## 2020-10-14 DIAGNOSIS — J3081 Allergic rhinitis due to animal (cat) (dog) hair and dander: Secondary | ICD-10-CM | POA: Diagnosis not present

## 2020-10-14 DIAGNOSIS — J3089 Other allergic rhinitis: Secondary | ICD-10-CM | POA: Diagnosis not present

## 2020-10-24 DIAGNOSIS — J301 Allergic rhinitis due to pollen: Secondary | ICD-10-CM | POA: Diagnosis not present

## 2020-10-24 DIAGNOSIS — J3089 Other allergic rhinitis: Secondary | ICD-10-CM | POA: Diagnosis not present

## 2020-10-24 DIAGNOSIS — J3081 Allergic rhinitis due to animal (cat) (dog) hair and dander: Secondary | ICD-10-CM | POA: Diagnosis not present

## 2020-10-28 ENCOUNTER — Other Ambulatory Visit: Payer: Self-pay | Admitting: Family Medicine

## 2020-10-28 DIAGNOSIS — J3081 Allergic rhinitis due to animal (cat) (dog) hair and dander: Secondary | ICD-10-CM | POA: Diagnosis not present

## 2020-10-28 DIAGNOSIS — J301 Allergic rhinitis due to pollen: Secondary | ICD-10-CM | POA: Diagnosis not present

## 2020-10-28 DIAGNOSIS — J3089 Other allergic rhinitis: Secondary | ICD-10-CM | POA: Diagnosis not present

## 2020-10-28 NOTE — Telephone Encounter (Signed)
Last filled 09/18/2020 Last OV 12/19/2019  Ok to fill?

## 2020-11-01 ENCOUNTER — Encounter: Payer: Self-pay | Admitting: Family Medicine

## 2020-11-04 DIAGNOSIS — J301 Allergic rhinitis due to pollen: Secondary | ICD-10-CM | POA: Diagnosis not present

## 2020-11-04 DIAGNOSIS — J3081 Allergic rhinitis due to animal (cat) (dog) hair and dander: Secondary | ICD-10-CM | POA: Diagnosis not present

## 2020-11-04 DIAGNOSIS — J3089 Other allergic rhinitis: Secondary | ICD-10-CM | POA: Diagnosis not present

## 2020-11-16 ENCOUNTER — Other Ambulatory Visit: Payer: Self-pay | Admitting: Family Medicine

## 2020-11-18 DIAGNOSIS — J3081 Allergic rhinitis due to animal (cat) (dog) hair and dander: Secondary | ICD-10-CM | POA: Diagnosis not present

## 2020-11-18 DIAGNOSIS — J3089 Other allergic rhinitis: Secondary | ICD-10-CM | POA: Diagnosis not present

## 2020-11-18 DIAGNOSIS — J301 Allergic rhinitis due to pollen: Secondary | ICD-10-CM | POA: Diagnosis not present

## 2020-11-25 DIAGNOSIS — J301 Allergic rhinitis due to pollen: Secondary | ICD-10-CM | POA: Diagnosis not present

## 2020-11-25 DIAGNOSIS — J3081 Allergic rhinitis due to animal (cat) (dog) hair and dander: Secondary | ICD-10-CM | POA: Diagnosis not present

## 2020-11-25 DIAGNOSIS — J3089 Other allergic rhinitis: Secondary | ICD-10-CM | POA: Diagnosis not present

## 2020-12-02 DIAGNOSIS — J3081 Allergic rhinitis due to animal (cat) (dog) hair and dander: Secondary | ICD-10-CM | POA: Diagnosis not present

## 2020-12-02 DIAGNOSIS — J301 Allergic rhinitis due to pollen: Secondary | ICD-10-CM | POA: Diagnosis not present

## 2020-12-02 DIAGNOSIS — J3089 Other allergic rhinitis: Secondary | ICD-10-CM | POA: Diagnosis not present

## 2020-12-03 DIAGNOSIS — Z03818 Encounter for observation for suspected exposure to other biological agents ruled out: Secondary | ICD-10-CM | POA: Diagnosis not present

## 2020-12-10 DIAGNOSIS — J3089 Other allergic rhinitis: Secondary | ICD-10-CM | POA: Diagnosis not present

## 2020-12-10 DIAGNOSIS — J3081 Allergic rhinitis due to animal (cat) (dog) hair and dander: Secondary | ICD-10-CM | POA: Diagnosis not present

## 2020-12-10 DIAGNOSIS — J301 Allergic rhinitis due to pollen: Secondary | ICD-10-CM | POA: Diagnosis not present

## 2020-12-11 ENCOUNTER — Encounter: Payer: Self-pay | Admitting: Family Medicine

## 2020-12-18 DIAGNOSIS — J301 Allergic rhinitis due to pollen: Secondary | ICD-10-CM | POA: Diagnosis not present

## 2020-12-18 DIAGNOSIS — J3081 Allergic rhinitis due to animal (cat) (dog) hair and dander: Secondary | ICD-10-CM | POA: Diagnosis not present

## 2020-12-18 DIAGNOSIS — J3089 Other allergic rhinitis: Secondary | ICD-10-CM | POA: Diagnosis not present

## 2020-12-23 ENCOUNTER — Ambulatory Visit: Payer: BC Managed Care – PPO | Admitting: Family Medicine

## 2020-12-23 ENCOUNTER — Encounter: Payer: Self-pay | Admitting: Family Medicine

## 2020-12-23 ENCOUNTER — Other Ambulatory Visit: Payer: Self-pay

## 2020-12-23 VITALS — BP 130/70 | HR 80 | Temp 98.1°F | Wt 177.3 lb

## 2020-12-23 DIAGNOSIS — J3081 Allergic rhinitis due to animal (cat) (dog) hair and dander: Secondary | ICD-10-CM | POA: Diagnosis not present

## 2020-12-23 DIAGNOSIS — W57XXXA Bitten or stung by nonvenomous insect and other nonvenomous arthropods, initial encounter: Secondary | ICD-10-CM

## 2020-12-23 DIAGNOSIS — S70362A Insect bite (nonvenomous), left thigh, initial encounter: Secondary | ICD-10-CM

## 2020-12-23 DIAGNOSIS — J3089 Other allergic rhinitis: Secondary | ICD-10-CM | POA: Diagnosis not present

## 2020-12-23 DIAGNOSIS — J301 Allergic rhinitis due to pollen: Secondary | ICD-10-CM | POA: Diagnosis not present

## 2020-12-23 MED ORDER — DOXYCYCLINE HYCLATE 100 MG PO TABS: 100.0000 mg | ORAL_TABLET | Freq: Two times a day (BID) | ORAL | 0 refills | Status: DC

## 2020-12-23 NOTE — Progress Notes (Signed)
Established Patient Office Visit  Subjective:  Patient ID: Kenneth Knapp, male    DOB: Nov 13, 1960  Age: 60 y.o. MRN: [id number]  CC:  Chief Complaint  Patient presents with  . Tick Removal    Found may 29th, inside of L thigh, rash and itching    HPI DEVINE KLINGEL presents for recent tick bite left thigh.  He first noticed this on May 29.  He states this was a very small tick with what sounds like a deer tick.  He did not notice any rash initially but after a few days noticed some redness and a small ring around the site of the bite.  He has some itching but no pain.  He does have some headaches intermittently but these are chronic.  No arthralgias.  No myalgias.  No other new symptoms.  Past Medical History:  Diagnosis Date  . AR (allergic rhinitis)   . BPH (benign prostatic hyperplasia)   . ED (erectile dysfunction)   . History of echocardiogram    04-15-2016  mild LVH, ef 60-65%  . History of exercise stress test (03-22-2019 per pt had not any cardiac s&s since )   04-10-2016 (cardiology evaluation for precordial pain by dr t. Tresa Endo)  Low risk w/ low specificity (mild positive ekg changes at very high workload and brisk return to normal)  . Hyperlipidemia   . Hypogonadism male   . Rotator cuff tear, right   . Wears glasses     Past Surgical History:  Procedure Laterality Date  . ARTHOSCOPIC ROTAOR CUFF REPAIR Right 03/23/2019   Procedure: ARTHROSCOPIC ROTATOR CUFF REPAIR; SUBACROMIAL DECOMPRESSION; BICEP TENODISIS;  Surgeon: Jones Broom, MD;  Location: [facility] [place];  Service: Orthopedics;  Laterality: Right;  90 MIN  . COLONOSCOPY  2013  . LUMBAR DISC SURGERY  2002   L4 -- L5  . MASS EXCISION Left 06/08/2017   Procedure: EXCISION CYST BUTTOCK;  Surgeon: Harriette Bouillon, MD;  Location: [facility];  Service: General;  Laterality: Left;  . WISDOM TOOTH EXTRACTION      Family History  Problem Relation Age of Onset  . ALS Mother    . Arthritis Mother   . Heart disease Father   . Stroke Father 80       hemorrhagic  . Colon cancer Paternal Grandmother   . Heart disease Paternal Grandfather     Social History   Socioeconomic History  . Marital status: Married    Spouse name: Not on file  . Number of children: Not on file  . Years of education: Not on file  . Highest education level: Not on file  Occupational History  . Not on file  Tobacco Use  . Smoking status: Never Smoker  . Smokeless tobacco: Never Used  Vaping Use  . Vaping Use: Never used  Substance and Sexual Activity  . Alcohol use: Yes    Comment: 1 DRINK PER DAY  . Drug use: Never  . Sexual activity: Not on file  Other Topics Concern  . Not on file  Social History Narrative  . Not on file   Social Determinants of Health   Financial Resource Strain: Not on file  Food Insecurity: Not on file  Transportation Needs: Not on file  Physical Activity: Not on file  Stress: Not on file  Social Connections: Not on file  Intimate Partner Violence: Not on file    Outpatient Medications Prior to Visit  Medication Sig Dispense Refill  .  albuterol (PROAIR HFA) 108 (90 Base) MCG/ACT inhaler Inhale 2 puffs into the lungs every 6 (six) hours as needed for wheezing or shortness of breath. 1 each 0  . benzonatate (TESSALON PERLES) 100 MG capsule Take 1 capsule (100 mg total) by mouth 3 (three) times daily as needed. 20 capsule 0  . cyclobenzaprine (FLEXERIL) 10 MG tablet Take 10 mg by mouth as needed for muscle spasms.    . diclofenac (VOLTAREN) 75 MG EC tablet TAKE 1 TABLET BY MOUTH TWICE A DAY 180 tablet 0  . doxycycline (VIBRAMYCIN) 100 MG capsule Take 1 capsule (100 mg total) by mouth 2 (two) times daily. 20 capsule 0  . EPINEPHrine 0.3 mg/0.3 mL IJ SOAJ injection Use as needed    . fexofenadine (ALLEGRA) 180 MG tablet Take 180 mg by mouth at bedtime.    . lovastatin (MEVACOR) 20 MG tablet TAKE 1 TABLET BY MOUTH EVERYDAY AT BEDTIME 90 tablet 1  .  Multiple Vitamin (MULTIVITAMIN) tablet Take 1 tablet by mouth daily.    Marland Kitchen PRESCRIPTION MEDICATION ALLERGY INJECTIONS WEEKLY    . sildenafil (VIAGRA) 50 MG tablet TAKE 1 TABLET BY MOUTH 1 HOUR PRIOR TO SEXUAL ACTIVITY 10 tablet 5  . testosterone cypionate (DEPOTESTOSTERONE CYPIONATE) 200 MG/ML injection INJECT 1.5 MILLILITERS INTO MUSCLE EVERY 10 DAYS 10 mL 0  . traZODone (DESYREL) 50 MG tablet TAKE 0.5-1 TABLETS (25-50 MG TOTAL) BY MOUTH AT BEDTIME AS NEEDED FOR SLEEP. 90 tablet 1  . traZODone (DESYREL) 50 MG tablet TAKE 1 TO 2 TABLETS BY MOUTH AT BEDTIME AS NEEDED FOR INSOMNIA 180 tablet 2  . zolpidem (AMBIEN) 5 MG tablet TAKE 1 TO 2 TABLETS BY MOUTH AT BEDTIME AS NEEDED FOR INSOMNIA 60 tablet 1   No facility-administered medications prior to visit.    Allergies  Allergen Reactions  . No Known Allergies     ROS Review of Systems  Constitutional: Negative for chills and fever.  Respiratory: Negative for cough and shortness of breath.   Cardiovascular: Negative for chest pain.  Skin: Positive for rash.  Hematological: Negative for adenopathy.      Objective:    Physical Exam Vitals reviewed.  Constitutional:      Appearance: Normal appearance.  Cardiovascular:     Rate and Rhythm: Normal rate and regular rhythm.  Skin:    Comments: He has small punctate area left upper inner thigh.  This is surrounded by approximately 1 and half centimeter border of erythematous ring.  Nonscaly.  No visible retained tick parts.  Neurological:     Mental Status: He is alert.     BP 130/70 (BP Location: Left Arm, Patient Position: Sitting, Cuff Size: Normal)   Pulse 80   Temp 98.1 F (36.7 C) (Oral)   Wt 177 lb 4.8 oz (80.4 kg)   SpO2 97%   BMI 25.44 kg/m  Wt Readings from Last 3 Encounters:  12/23/20 177 lb 4.8 oz (80.4 kg)  02/27/20 174 lb 6.4 oz (79.1 kg)  12/19/19 172 lb 6.4 oz (78.2 kg)     Health Maintenance Due  Topic Date Due  . Pneumococcal Vaccine 68-14 Years old (1 of  2 - PPSV23) Never done  . HIV Screening  Never done  . COVID-19 Vaccine (2 - Moderna 3-dose series) 07/19/2020    There are no preventive care reminders to display for this patient.  Lab Results  Component Value Date   TSH 2.73 08/27/2017   Lab Results  Component Value Date   WBC  7.2 08/04/2018   HGB 16.6 08/04/2018   HCT 48.5 08/04/2018   MCV 94.5 08/04/2018   PLT 235.0 08/04/2018   Lab Results  Component Value Date   NA 138 08/27/2017   K 3.9 08/27/2017   CO2 28 08/27/2017   GLUCOSE 111 (H) 08/27/2017   BUN 15 08/27/2017   CREATININE 0.92 08/27/2017   BILITOT 0.8 08/27/2017   ALKPHOS 61 08/27/2017   AST 21 08/27/2017   ALT 21 08/27/2017   PROT 6.4 08/27/2017   ALBUMIN 3.9 08/27/2017   CALCIUM 8.6 08/27/2017   GFR 90.32 08/27/2017   Lab Results  Component Value Date   CHOL 120 08/27/2017   Lab Results  Component Value Date   HDL 41.10 08/27/2017   Lab Results  Component Value Date   LDLCALC 66 08/27/2017   Lab Results  Component Value Date   TRIG 68.0 08/27/2017   Lab Results  Component Value Date   CHOLHDL 3 08/27/2017   No results found for: HGBA1C    Assessment & Plan:   Problem List Items Addressed This Visit   None   Visit Diagnoses    Tick bite of left thigh, initial encounter    -  Primary    Patient has surrounding rash which appeared a few days after the bite.  Suspect most likely this is allergic reaction and less likely erythema migrans.  He denies any other worrisome symptoms such as fever, progressive headache, myalgias, arthralgias, etc.  He is very concerned especially because of a couple of acquaintances that have had severe complications of Lyme disease. -We discussed pros and cons of antibiotic therapy.  We also discussed limitations of antibody testing.  We elected to go and cover with doxycycline 100 mg twice daily for 10 days and follow-up promptly for any fever, progressive headaches, arthralgias, or any other new  symptoms.  Meds ordered this encounter  Medications  . doxycycline (VIBRA-TABS) 100 MG tablet    Sig: Take 1 tablet (100 mg total) by mouth 2 (two) times daily.    Dispense:  20 tablet    Refill:  0    Follow-up: No follow-ups on file.    Evelena Peat, MD

## 2020-12-23 NOTE — Patient Instructions (Signed)
Tick Bite Information, Adult Ticks are insects that draw blood for food. Most ticks live in shrubs and grassy and wooded areas. They climb onto people and animals that brush against the leaves and grasses that they rest on. Then they bite, attaching themselves to the skin. Most ticks are harmless, but some ticks may carry germs that can spread to a person through a bite and cause a disease. To reduce your risk of getting a disease from a tick bite, make sure you:  Take steps to prevent tick bites.  Check for ticks after being outdoors where ticks live.  Watch for symptoms of disease if a tick attached to you or if you suspect a tick bite. How can I prevent tick bites? Take these steps to help prevent tick bites when you go outdoors in an area where ticks live: Use insect repellent  Use insect repellent that has DEET (20% or higher), picaridin, or IR3535 in it. Follow the instructions on the label. Use these products on: ? Bare skin. ? The top of your boots. ? Your pant legs. ? Your sleeve cuffs.  For insect repellent that contains permethrin, follow the instructions on the label. Use these products on: ? Clothing. ? Boots. ? Outdoor gear. ? Tents. When you are outside  Wear protective clothing. Long sleeves and long pants offer the best protection from ticks.  Wear light-colored clothing so you can see ticks more easily.  Tuck your pant legs into your socks.  If you go walking on a trail, stay in the middle of the trail so your skin, hair, and clothing do not touch the bushes.  Avoid walking through areas with long grass.  Check for ticks on your clothing, hair, and skin often while you are outside, and check again before you go inside. Make sure to check the scalp, neck, armpits, waist, groin, and joint areas. These are the spots where ticks attach themselves most often. When you go indoors  Check your clothing for ticks. Tumble dry clothes in a dryer on high heat for at least  10 minutes. If clothes are damp, additional time may be needed. If clothes require washing, use hot water.  Examine gear and pets.  Shower soon after being outdoors.  Check your body for ticks. Conduct a full body check using a mirror. What is the proper way to remove a tick? If you find a tick on your body, remove it as soon as possible. Removing a tick sooner can prevent germs from passing to your body. Do not remove the tick with your bare fingers. To remove a tick that is crawling on your skin but has not bitten, use either of these methods:  Go outdoors and brush the tick off.  Remove the tick with tape or a lint roller. To remove a tick that is attached to your skin: 1. Wash your hands. If you have latex gloves, put them on. 2. Use fine-tipped tweezers, curved forceps, or a tick-removal tool to gently grasp the tick as close to your skin and the tick's head as possible. 3. Gently pull with a steady, upward, even pressure until the tick lets go. 4. When removing the tick: ? Take care to keep the tick's head attached to its body. ? Do not twist or jerk the tick. This can make the tick's head or mouth parts break off and remain in the skin. ? Do not squeeze or crush the tick's body. This could force disease-carrying fluids from the tick   into your body. Do not try to remove a tick with heat, alcohol, petroleum jelly, or fingernail polish. Using these methods can cause the tick to salivate and regurgitate into your bloodstream, increasing your risk of getting a disease.   What should I do after removing a tick?  Dispose of the tick. Do not crush a tick with your fingers.  Clean the bite area and your hands with soap and water, rubbing alcohol, or an iodine scrub.  If an antiseptic cream or ointment is available, apply a small amount to the bite site.  Wash and disinfect any instruments that you used to remove the tick. How should I dispose of a tick? To dispose of a live tick, use  one of these methods:  Place it in rubbing alcohol.  Place it in a sealed bag or container.  Wrap it tightly in tape.  Flush it down the toilet. Contact a health care provider if:  You have symptoms of a disease after a tick bite. Symptoms of a tick-borne disease can occur from moments after the tick bites to 30 days after a tick is removed. Symptoms include: ? Fever or chills. ? Any of these signs in the bite area:  A red rash that makes a circle (bull's-eye rash) in the bite area.  Redness and swelling. ? Headache. ? Muscle, joint, or bone pain. ? Abnormal tiredness. ? Numbness in your legs or difficulty walking or moving your legs. ? Tender, swollen lymph glands.  A part of a tick breaks off and gets stuck in your skin. Get help right away if:  You are not able to remove a tick.  You experience muscle weakness or paralysis.  Your symptoms get worse or you experience new symptoms.  You find an engorged tick on your skin and you are in an area where disease from ticks is a high risk. Summary  Ticks may carry germs that can spread to a person through a bite and cause a disease.  Wear protective clothing and use insect repellent to prevent tick bites. Follow the instructions on the label.  If you find a tick on your body, remove it as soon as possible. If the tick is attached, do not try to remove with heat, alcohol, petroleum jelly, or fingernail polish.  Remove the attached tick using fine-tipped tweezers, curved forceps, or a tick-removal tool. Gently pull with steady, upward, even pressure until the tick lets go. Do not twist or jerk the tick. Do not squeeze or crush the tick's body.  If you have symptoms of a disease after being bitten by a tick, contact a health care provider. This information is not intended to replace advice given to you by your health care provider. Make sure you discuss any questions you have with your health care provider. Document Revised:  07/03/2019 Document Reviewed: 07/03/2019 Elsevier Patient Education  2021 Elsevier Inc.  

## 2020-12-26 ENCOUNTER — Encounter: Payer: Self-pay | Admitting: Family Medicine

## 2021-01-01 ENCOUNTER — Other Ambulatory Visit: Payer: Self-pay | Admitting: Family Medicine

## 2021-01-02 DIAGNOSIS — J3089 Other allergic rhinitis: Secondary | ICD-10-CM | POA: Diagnosis not present

## 2021-01-02 DIAGNOSIS — J301 Allergic rhinitis due to pollen: Secondary | ICD-10-CM | POA: Diagnosis not present

## 2021-01-03 ENCOUNTER — Other Ambulatory Visit (HOSPITAL_BASED_OUTPATIENT_CLINIC_OR_DEPARTMENT_OTHER): Payer: Self-pay

## 2021-01-03 ENCOUNTER — Ambulatory Visit: Payer: BC Managed Care – PPO | Attending: Internal Medicine

## 2021-01-03 DIAGNOSIS — Z23 Encounter for immunization: Secondary | ICD-10-CM

## 2021-01-03 MED ORDER — COVID-19 AD26 VACCINE(JANSSEN) 0.5 ML IM SUSP
INTRAMUSCULAR | 0 refills | Status: DC
  Filled 2021-01-03: qty 0.5, 1d supply, fill #0

## 2021-01-03 NOTE — Progress Notes (Signed)
   Covid-19 Vaccination Clinic  Name:  TAIGE HOUSMAN    MRN: [id number] DOB: 12-31-1960  01/03/2021  Mr. Blackwelder was observed post Covid-19 immunization for 15 minutes without incident. He was provided with Vaccine Information Sheet and instruction to access the V-Safe system.   Mr. Schliep was instructed to call 911 with any severe reactions post vaccine: Difficulty breathing  Swelling of face and throat  A fast heartbeat  A bad rash all over body  Dizziness and weakness   Immunizations Administered     Name Date Dose VIS Date Route   JANSSEN COVID-19 VACCINE 01/03/2021  9:20 AM 0.5 mL 05/08/2020 Intramuscular   Manufacturer: [employer]   Lot: [id number]   NDC: [phone]

## 2021-01-06 DIAGNOSIS — J3081 Allergic rhinitis due to animal (cat) (dog) hair and dander: Secondary | ICD-10-CM | POA: Diagnosis not present

## 2021-01-06 DIAGNOSIS — J3089 Other allergic rhinitis: Secondary | ICD-10-CM | POA: Diagnosis not present

## 2021-01-06 DIAGNOSIS — J301 Allergic rhinitis due to pollen: Secondary | ICD-10-CM | POA: Diagnosis not present

## 2021-01-17 DIAGNOSIS — J301 Allergic rhinitis due to pollen: Secondary | ICD-10-CM | POA: Diagnosis not present

## 2021-01-17 DIAGNOSIS — J3081 Allergic rhinitis due to animal (cat) (dog) hair and dander: Secondary | ICD-10-CM | POA: Diagnosis not present

## 2021-01-17 DIAGNOSIS — J3089 Other allergic rhinitis: Secondary | ICD-10-CM | POA: Diagnosis not present

## 2021-01-21 DIAGNOSIS — J3081 Allergic rhinitis due to animal (cat) (dog) hair and dander: Secondary | ICD-10-CM | POA: Diagnosis not present

## 2021-01-21 DIAGNOSIS — J3089 Other allergic rhinitis: Secondary | ICD-10-CM | POA: Diagnosis not present

## 2021-01-21 DIAGNOSIS — J301 Allergic rhinitis due to pollen: Secondary | ICD-10-CM | POA: Diagnosis not present

## 2021-01-24 DIAGNOSIS — J3089 Other allergic rhinitis: Secondary | ICD-10-CM | POA: Diagnosis not present

## 2021-01-24 DIAGNOSIS — J3081 Allergic rhinitis due to animal (cat) (dog) hair and dander: Secondary | ICD-10-CM | POA: Diagnosis not present

## 2021-01-24 DIAGNOSIS — J301 Allergic rhinitis due to pollen: Secondary | ICD-10-CM | POA: Diagnosis not present

## 2021-02-03 DIAGNOSIS — J301 Allergic rhinitis due to pollen: Secondary | ICD-10-CM | POA: Diagnosis not present

## 2021-02-03 DIAGNOSIS — J3089 Other allergic rhinitis: Secondary | ICD-10-CM | POA: Diagnosis not present

## 2021-02-03 DIAGNOSIS — J3081 Allergic rhinitis due to animal (cat) (dog) hair and dander: Secondary | ICD-10-CM | POA: Diagnosis not present

## 2021-02-06 DIAGNOSIS — J3089 Other allergic rhinitis: Secondary | ICD-10-CM | POA: Diagnosis not present

## 2021-02-06 DIAGNOSIS — J3081 Allergic rhinitis due to animal (cat) (dog) hair and dander: Secondary | ICD-10-CM | POA: Diagnosis not present

## 2021-02-06 DIAGNOSIS — J301 Allergic rhinitis due to pollen: Secondary | ICD-10-CM | POA: Diagnosis not present

## 2021-02-11 DIAGNOSIS — J3081 Allergic rhinitis due to animal (cat) (dog) hair and dander: Secondary | ICD-10-CM | POA: Diagnosis not present

## 2021-02-11 DIAGNOSIS — J301 Allergic rhinitis due to pollen: Secondary | ICD-10-CM | POA: Diagnosis not present

## 2021-02-11 DIAGNOSIS — J3089 Other allergic rhinitis: Secondary | ICD-10-CM | POA: Diagnosis not present

## 2021-02-16 ENCOUNTER — Encounter: Payer: Self-pay | Admitting: Family Medicine

## 2021-02-17 ENCOUNTER — Telehealth: Payer: Self-pay | Admitting: Internal Medicine

## 2021-02-17 NOTE — Telephone Encounter (Signed)
The pt has been advised that the first available appt with anyone is 8/25.  He tells me that he has gotten an appt with his PCP for now and will keep that. I did offer to schedule him a new appt with Dr Rhea Belton since he has not been seen since 2013.  Appt made for 10/10 at 950 am.  He will call back if his PCP feels he needs to see GI prior to that appt.

## 2021-02-17 NOTE — Telephone Encounter (Signed)
Pt has been experiencing mucus in his stool about 2 weeks ago. He reports to have been constipation but has no pain. He would like to be seen sooner that first available with APP on 03/13/21. Pt not seen since 2013. Due for colon in 07/2021. Pls call him.

## 2021-02-18 DIAGNOSIS — J3081 Allergic rhinitis due to animal (cat) (dog) hair and dander: Secondary | ICD-10-CM | POA: Diagnosis not present

## 2021-02-18 DIAGNOSIS — J3089 Other allergic rhinitis: Secondary | ICD-10-CM | POA: Diagnosis not present

## 2021-02-18 DIAGNOSIS — J301 Allergic rhinitis due to pollen: Secondary | ICD-10-CM | POA: Diagnosis not present

## 2021-02-24 DIAGNOSIS — J301 Allergic rhinitis due to pollen: Secondary | ICD-10-CM | POA: Diagnosis not present

## 2021-02-24 DIAGNOSIS — J3089 Other allergic rhinitis: Secondary | ICD-10-CM | POA: Diagnosis not present

## 2021-02-24 DIAGNOSIS — J3081 Allergic rhinitis due to animal (cat) (dog) hair and dander: Secondary | ICD-10-CM | POA: Diagnosis not present

## 2021-02-25 DIAGNOSIS — J3081 Allergic rhinitis due to animal (cat) (dog) hair and dander: Secondary | ICD-10-CM | POA: Diagnosis not present

## 2021-02-25 DIAGNOSIS — J3089 Other allergic rhinitis: Secondary | ICD-10-CM | POA: Diagnosis not present

## 2021-03-03 DIAGNOSIS — J301 Allergic rhinitis due to pollen: Secondary | ICD-10-CM | POA: Diagnosis not present

## 2021-03-03 DIAGNOSIS — J3081 Allergic rhinitis due to animal (cat) (dog) hair and dander: Secondary | ICD-10-CM | POA: Diagnosis not present

## 2021-03-03 DIAGNOSIS — J3089 Other allergic rhinitis: Secondary | ICD-10-CM | POA: Diagnosis not present

## 2021-03-10 DIAGNOSIS — J3089 Other allergic rhinitis: Secondary | ICD-10-CM | POA: Diagnosis not present

## 2021-03-10 DIAGNOSIS — J301 Allergic rhinitis due to pollen: Secondary | ICD-10-CM | POA: Diagnosis not present

## 2021-03-10 DIAGNOSIS — J3081 Allergic rhinitis due to animal (cat) (dog) hair and dander: Secondary | ICD-10-CM | POA: Diagnosis not present

## 2021-03-14 DIAGNOSIS — J3081 Allergic rhinitis due to animal (cat) (dog) hair and dander: Secondary | ICD-10-CM | POA: Diagnosis not present

## 2021-03-14 DIAGNOSIS — J3089 Other allergic rhinitis: Secondary | ICD-10-CM | POA: Diagnosis not present

## 2021-03-14 DIAGNOSIS — J301 Allergic rhinitis due to pollen: Secondary | ICD-10-CM | POA: Diagnosis not present

## 2021-03-17 DIAGNOSIS — J3081 Allergic rhinitis due to animal (cat) (dog) hair and dander: Secondary | ICD-10-CM | POA: Diagnosis not present

## 2021-03-17 DIAGNOSIS — J3089 Other allergic rhinitis: Secondary | ICD-10-CM | POA: Diagnosis not present

## 2021-03-17 DIAGNOSIS — J301 Allergic rhinitis due to pollen: Secondary | ICD-10-CM | POA: Diagnosis not present

## 2021-03-19 DIAGNOSIS — L812 Freckles: Secondary | ICD-10-CM | POA: Diagnosis not present

## 2021-03-19 DIAGNOSIS — J3089 Other allergic rhinitis: Secondary | ICD-10-CM | POA: Diagnosis not present

## 2021-03-19 DIAGNOSIS — L821 Other seborrheic keratosis: Secondary | ICD-10-CM | POA: Diagnosis not present

## 2021-03-19 DIAGNOSIS — L309 Dermatitis, unspecified: Secondary | ICD-10-CM | POA: Diagnosis not present

## 2021-03-19 DIAGNOSIS — J3081 Allergic rhinitis due to animal (cat) (dog) hair and dander: Secondary | ICD-10-CM | POA: Diagnosis not present

## 2021-03-19 DIAGNOSIS — D225 Melanocytic nevi of trunk: Secondary | ICD-10-CM | POA: Diagnosis not present

## 2021-03-19 DIAGNOSIS — J301 Allergic rhinitis due to pollen: Secondary | ICD-10-CM | POA: Diagnosis not present

## 2021-03-25 DIAGNOSIS — J3081 Allergic rhinitis due to animal (cat) (dog) hair and dander: Secondary | ICD-10-CM | POA: Diagnosis not present

## 2021-03-25 DIAGNOSIS — J3089 Other allergic rhinitis: Secondary | ICD-10-CM | POA: Diagnosis not present

## 2021-03-25 DIAGNOSIS — J301 Allergic rhinitis due to pollen: Secondary | ICD-10-CM | POA: Diagnosis not present

## 2021-03-27 DIAGNOSIS — J3081 Allergic rhinitis due to animal (cat) (dog) hair and dander: Secondary | ICD-10-CM | POA: Diagnosis not present

## 2021-03-27 DIAGNOSIS — J301 Allergic rhinitis due to pollen: Secondary | ICD-10-CM | POA: Diagnosis not present

## 2021-03-27 DIAGNOSIS — J3089 Other allergic rhinitis: Secondary | ICD-10-CM | POA: Diagnosis not present

## 2021-03-28 ENCOUNTER — Other Ambulatory Visit: Payer: Self-pay | Admitting: Family Medicine

## 2021-03-28 NOTE — Telephone Encounter (Signed)
Last filled 10/28/2020 Last OV 12/23/2020 Ok to fill?

## 2021-03-31 ENCOUNTER — Other Ambulatory Visit: Payer: Self-pay | Admitting: Family Medicine

## 2021-03-31 DIAGNOSIS — J3081 Allergic rhinitis due to animal (cat) (dog) hair and dander: Secondary | ICD-10-CM | POA: Diagnosis not present

## 2021-03-31 DIAGNOSIS — J301 Allergic rhinitis due to pollen: Secondary | ICD-10-CM | POA: Diagnosis not present

## 2021-03-31 DIAGNOSIS — J3089 Other allergic rhinitis: Secondary | ICD-10-CM | POA: Diagnosis not present

## 2021-04-08 DIAGNOSIS — J3089 Other allergic rhinitis: Secondary | ICD-10-CM | POA: Diagnosis not present

## 2021-04-08 DIAGNOSIS — J3081 Allergic rhinitis due to animal (cat) (dog) hair and dander: Secondary | ICD-10-CM | POA: Diagnosis not present

## 2021-04-08 DIAGNOSIS — J301 Allergic rhinitis due to pollen: Secondary | ICD-10-CM | POA: Diagnosis not present

## 2021-04-14 DIAGNOSIS — J301 Allergic rhinitis due to pollen: Secondary | ICD-10-CM | POA: Diagnosis not present

## 2021-04-14 DIAGNOSIS — J3081 Allergic rhinitis due to animal (cat) (dog) hair and dander: Secondary | ICD-10-CM | POA: Diagnosis not present

## 2021-04-14 DIAGNOSIS — J3089 Other allergic rhinitis: Secondary | ICD-10-CM | POA: Diagnosis not present

## 2021-04-24 DIAGNOSIS — J3089 Other allergic rhinitis: Secondary | ICD-10-CM | POA: Diagnosis not present

## 2021-04-24 DIAGNOSIS — J301 Allergic rhinitis due to pollen: Secondary | ICD-10-CM | POA: Diagnosis not present

## 2021-04-24 DIAGNOSIS — J3081 Allergic rhinitis due to animal (cat) (dog) hair and dander: Secondary | ICD-10-CM | POA: Diagnosis not present

## 2021-04-28 ENCOUNTER — Encounter: Payer: Self-pay | Admitting: Internal Medicine

## 2021-04-28 ENCOUNTER — Ambulatory Visit: Payer: BC Managed Care – PPO | Admitting: Internal Medicine

## 2021-04-28 VITALS — BP 120/60 | HR 73 | Ht 70.0 in | Wt 180.2 lb

## 2021-04-28 DIAGNOSIS — E611 Iron deficiency: Secondary | ICD-10-CM

## 2021-04-28 DIAGNOSIS — J3081 Allergic rhinitis due to animal (cat) (dog) hair and dander: Secondary | ICD-10-CM | POA: Diagnosis not present

## 2021-04-28 DIAGNOSIS — K5900 Constipation, unspecified: Secondary | ICD-10-CM | POA: Diagnosis not present

## 2021-04-28 DIAGNOSIS — J301 Allergic rhinitis due to pollen: Secondary | ICD-10-CM | POA: Diagnosis not present

## 2021-04-28 DIAGNOSIS — R198 Other specified symptoms and signs involving the digestive system and abdomen: Secondary | ICD-10-CM | POA: Diagnosis not present

## 2021-04-28 DIAGNOSIS — J3089 Other allergic rhinitis: Secondary | ICD-10-CM | POA: Diagnosis not present

## 2021-04-28 MED ORDER — SUTAB 1479-225-188 MG PO TABS: ORAL_TABLET | ORAL | 0 refills | Status: DC

## 2021-04-28 NOTE — Progress Notes (Signed)
Patient ID: MCGWIRE DASARO, male   DOB: 1960/08/08, 60 y.o.   MRN: [id number] HPI: Kenneth Knapp is a 60 year old male with a history of hyperlipidemia, hypogonadism and erectile dysfunction, BPH who is seen in consult at the request of Dr. Caryl Never to evaluate iron deficiency and change in bowel habit.  He is here alone today.  He reports that he developed constipation 2 to 3 months ago.  Initially this was associated with mucus and red blood with stool.  The red blood lasted about 2 days and he has not seen blood since.  He has continued to have issues with constipation and not regular bowel movements.  Previous to 2 to 3 months ago he would have daily bowel movements.  Now he will go 1 to 3 days between bowel movements or bowel movements will be small and incomplete.  He reports he feels that this is "annoying rather than alarming".  He has had no diet or medication change.  He did try probiotic without much benefit.  He tried MiraLAX but only for about 5 days which he did seem to help.  He has had a bloating sensation.  No abdominal pain.  No heartburn.  No nausea or vomiting.  Good appetite.  No dysphagia.  He does report that he rarely eats red meat and he has been a blood donor several times per year.  He started oral iron and his iron studies normalized.  Family history notable for colon cancer in paternal grandmother and a recent diagnosis and a paternal cousin. His son has gluten intolerance.  He works as a Pharmacist, hospital.  He does drink alcohol roughly 1 drink per day on average.  No tobacco use.  His last colonoscopy was in January 2013 which was normal with good prep.   Past Medical History:  Diagnosis Date   AR (allergic rhinitis)    BPH (benign prostatic hyperplasia)    ED (erectile dysfunction)    History of echocardiogram    04-15-2016  mild LVH, ef 60-65%   History of exercise stress test (03-22-2019 per pt had not any cardiac s&s since )    04-10-2016 (cardiology evaluation for precordial pain by dr t. Tresa Endo)  Low risk w/ low specificity (mild positive ekg changes at very high workload and brisk return to normal)   Hyperlipidemia    Hypogonadism male    Rotator cuff tear, right    Wears glasses     Past Surgical History:  Procedure Laterality Date   ARTHOSCOPIC ROTAOR CUFF REPAIR Right 03/23/2019   Procedure: ARTHROSCOPIC ROTATOR CUFF REPAIR; SUBACROMIAL DECOMPRESSION; BICEP TENODISIS;  Surgeon: Jones Broom, MD;  Location: [facility];  Service: Orthopedics;  Laterality: Right;  90 MIN   COLONOSCOPY  2013   LUMBAR DISC SURGERY  2002   L4 -- L5   MASS EXCISION Left 06/08/2017   Procedure: EXCISION CYST BUTTOCK;  Surgeon: Harriette Bouillon, MD;  Location: [facility];  Service: General;  Laterality: Left;   WISDOM TOOTH EXTRACTION      Outpatient Medications Prior to Visit  Medication Sig Dispense Refill   albuterol (PROAIR HFA) 108 (90 Base) MCG/ACT inhaler Inhale 2 puffs into the lungs every 6 (six) hours as needed for wheezing or shortness of breath. 1 each 0   benzonatate (TESSALON PERLES) 100 MG capsule Take 1 capsule (100 mg total) by mouth 3 (three) times daily as needed. 20 capsule 0   COVID-19 Ad26 vaccine, JANSSEN/J&J, 0.5 ML  injection Inject into the muscle. 0.5 mL 0   cyclobenzaprine (FLEXERIL) 10 MG tablet Take 10 mg by mouth as needed for muscle spasms.     diclofenac (VOLTAREN) 75 MG EC tablet TAKE 1 TABLET BY MOUTH TWICE A DAY 180 tablet 0   doxycycline (VIBRA-TABS) 100 MG tablet Take 1 tablet (100 mg total) by mouth 2 (two) times daily. 20 tablet 0   EPINEPHrine 0.3 mg/0.3 mL IJ SOAJ injection Use as needed     fexofenadine (ALLEGRA) 180 MG tablet Take 180 mg by mouth at bedtime.     fluocinonide cream (LIDEX) 0.05 % Apply 1 application topically 2 (two) times daily.     hydrocortisone 2.5 % cream Apply 1 application topically 2 (two) times daily.     lovastatin (MEVACOR) 20 MG  tablet TAKE 1 TABLET BY MOUTH EVERYDAY AT BEDTIME 90 tablet 1   Multiple Vitamin (MULTIVITAMIN) tablet Take 1 tablet by mouth daily.     PRESCRIPTION MEDICATION ALLERGY INJECTIONS WEEKLY     sildenafil (VIAGRA) 50 MG tablet TAKE 1 TABLET BY MOUTH 1 HOUR PRIOR TO SEXUAL ACTIVITY 10 tablet 5   testosterone cypionate (DEPOTESTOSTERONE CYPIONATE) 200 MG/ML injection INJECT 1.5 ML (300 MG) INTRAMUSCULAR EVERY 10 DAYS 10 mL 0   traZODone (DESYREL) 50 MG tablet TAKE 0.5-1 TABLETS (25-50 MG TOTAL) BY MOUTH AT BEDTIME AS NEEDED FOR SLEEP. 90 tablet 1   traZODone (DESYREL) 50 MG tablet TAKE 1 TO 2 TABLETS BY MOUTH AT BEDTIME AS NEEDED FOR INSOMNIA 180 tablet 0   zolpidem (AMBIEN) 5 MG tablet TAKE 1 TO 2 TABLETS BY MOUTH AT BEDTIME AS NEEDED FOR INSOMNIA 60 tablet 1   sildenafil (VIAGRA) 50 MG tablet sildenafil 50 mg tablet  TAKE 1 TABLET BY MOUTH 1 HOUR PRIOR TO SEXUAL ACTIVITY     testosterone cypionate (DEPOTESTOSTERONE CYPIONATE) 200 MG/ML injection 300 mg every 14 (fourteen) days.     zolpidem (AMBIEN) 5 MG tablet as needed.     doxycycline (VIBRAMYCIN) 100 MG capsule Take 1 capsule (100 mg total) by mouth 2 (two) times daily. 20 capsule 0   No facility-administered medications prior to visit.    Allergies  Allergen Reactions   No Known Allergies     Family History  Problem Relation Age of Onset   ALS Mother    Arthritis Mother    Heart disease Father    Stroke Father 60       hemorrhagic   Colon cancer Paternal Grandmother    Heart disease Paternal Grandfather     Social History   Tobacco Use   Smoking status: Never   Smokeless tobacco: Never  Vaping Use   Vaping Use: Never used  Substance Use Topics   Alcohol use: Yes    Comment: 1 DRINK PER DAY   Drug use: Never    ROS: As per history of present illness, otherwise negative  BP 120/60   Pulse 73   Ht 5\' 10"  (1.778 m)   Wt 180 lb 4 oz (81.8 kg)   BMI 25.86 kg/m  Constitutional: Well-developed and well-nourished. No  distress. HEENT: Normocephalic and atraumatic.  No scleral icterus. Cardiovascular: Normal rate, regular rhythm and intact distal pulses. No M/R/G Pulmonary/chest: Effort normal and breath sounds normal. No wheezing, rales or rhonchi. Abdominal: Soft, nontender, nondistended. Bowel sounds active throughout. There are no masses palpable. No hepatosplenomegaly. Extremities: no clubbing, cyanosis, or edema Neurological: Alert and oriented to person place and time. Skin: Skin is warm and dry.  Psychiatric: Normal mood and affect. Behavior is normal.  RELEVANT LABS AND IMAGING: Lab work to be scanned in to record Labs as of May 2022 hemoglobin 17.1, white count normal.  Albumin 4.5.  AST, ALT total bilirubin alkaline phosphatase normal.  Iron saturation 22, TIBC 393.  It should be noted that these numbers were after iron replacement.  Ferritin 52 (was low at 20 prior to iron supplementation). TTG normal.   ASSESSMENT/PLAN:  60 year old male with a history of hyperlipidemia, hypogonadism and erectile dysfunction, BPH who is seen in consult at the request of Dr. Caryl Never to evaluate iron deficiency and change in bowel habit.  Iron deficiency/change in bowel habit --colonoscopy is recommended.  We have discussed this today.  I have also recommended that we consider upper endoscopy in the event the colonoscopy is normal.  This is the normal evaluation of iron deficiency.  We reviewed the risk, benefits and alternatives and he is agreeable and wishes to proceed.  It should be noted that his son has at minimum gluten intolerance and possibly celiac disease though his personal TTG was normal earlier this year. --EGD and colonoscopy --Stop oral iron 1 week before procedures  2.  Mild constipation --I reviewed with him that MiraLAX is safe and effective and can be used daily if needed while we complete the evaluation of change in bowel habit and iron deficiency.  Oral iron can also cause or worsen  constipation.  It should be noted that his symptoms began prior to iron supplementation.   [id number], Elberta Fortis, Md [address] [place],  [place] [address]

## 2021-04-28 NOTE — Patient Instructions (Addendum)
You have been scheduled for an endoscopy and colonoscopy. Please follow the written instructions given to you at your visit today. Please pick up your prep supplies at the pharmacy within the next 1-3 days. If you use inhalers (even only as needed), please bring them with you on the day of your procedure.  Please purchase the following medications over the counter and take as directed: Miralax 17 grams dissolved in at least 8 ounces water/juice once daily as needed for constipation.  If you are age 79 or older, your body mass index should be between 23-30. Your Body mass index is 25.86 kg/m. If this is out of the aforementioned range listed, please consider follow up with your Primary Care Provider.  If you are age 42 or younger, your body mass index should be between 19-25. Your Body mass index is 25.86 kg/m. If this is out of the aformentioned range listed, please consider follow up with your Primary Care Provider.   ________________________________________________________  The [facility] providers would like to encourage you to use [facility] to communicate with providers for non-urgent requests or questions.  Due to long hold times on the telephone, sending your provider a message by [facility] may be a faster and more efficient way to get a response.  Please allow 48 business hours for a response.  Please remember that this is for non-urgent requests.   Due to recent changes in healthcare laws, you may see the results of your imaging and laboratory studies on MyChart before your provider has had a chance to review them.  We understand that in some cases there may be results that are confusing or concerning to you. Not all laboratory results come back in the same time frame and the provider may be waiting for multiple results in order to interpret others.  Please give [place] 48 hours in order for your provider to thoroughly review all the results before contacting the office for clarification of your  results.

## 2021-04-29 ENCOUNTER — Encounter: Payer: Self-pay | Admitting: Family Medicine

## 2021-04-30 ENCOUNTER — Other Ambulatory Visit: Payer: Self-pay | Admitting: Family Medicine

## 2021-05-01 ENCOUNTER — Other Ambulatory Visit (HOSPITAL_BASED_OUTPATIENT_CLINIC_OR_DEPARTMENT_OTHER): Payer: Self-pay

## 2021-05-01 ENCOUNTER — Ambulatory Visit: Payer: BC Managed Care – PPO | Attending: Internal Medicine

## 2021-05-01 DIAGNOSIS — Z23 Encounter for immunization: Secondary | ICD-10-CM

## 2021-05-01 MED ORDER — COVID-19 MRNA VACC (MODERNA) 100 MCG/0.5ML IM SUSP
INTRAMUSCULAR | 0 refills | Status: DC
  Filled 2021-05-01: qty 0.5, 1d supply, fill #0

## 2021-05-01 NOTE — Progress Notes (Signed)
   Covid-19 Vaccination Clinic  Name:  Kenneth Knapp    MRN: [id number] DOB: 08-15-1960  05/01/2021  [name] was observed post Covid-19 immunization for 15 minutes without incident. He was provided with Vaccine Information Sheet and instruction to access the V-Safe system.   [name] was instructed to call 911 with any severe reactions post vaccine: Difficulty breathing  Swelling of face and throat  A fast heartbeat  A bad rash all over body  Dizziness and weakness

## 2021-05-12 DIAGNOSIS — J301 Allergic rhinitis due to pollen: Secondary | ICD-10-CM | POA: Diagnosis not present

## 2021-05-12 DIAGNOSIS — J3081 Allergic rhinitis due to animal (cat) (dog) hair and dander: Secondary | ICD-10-CM | POA: Diagnosis not present

## 2021-05-12 DIAGNOSIS — J3089 Other allergic rhinitis: Secondary | ICD-10-CM | POA: Diagnosis not present

## 2021-05-18 ENCOUNTER — Other Ambulatory Visit: Payer: Self-pay | Admitting: Family Medicine

## 2021-05-19 DIAGNOSIS — J301 Allergic rhinitis due to pollen: Secondary | ICD-10-CM | POA: Diagnosis not present

## 2021-05-19 DIAGNOSIS — J3081 Allergic rhinitis due to animal (cat) (dog) hair and dander: Secondary | ICD-10-CM | POA: Diagnosis not present

## 2021-05-19 DIAGNOSIS — J3089 Other allergic rhinitis: Secondary | ICD-10-CM | POA: Diagnosis not present

## 2021-05-30 ENCOUNTER — Encounter: Payer: BC Managed Care – PPO | Admitting: Internal Medicine

## 2021-05-31 ENCOUNTER — Other Ambulatory Visit: Payer: Self-pay | Admitting: Family Medicine

## 2021-06-02 ENCOUNTER — Encounter: Payer: Self-pay | Admitting: Internal Medicine

## 2021-06-02 NOTE — Telephone Encounter (Signed)
Last filled 03/28/2021 Last OV 12/23/2020  Ok to fill?

## 2021-06-03 DIAGNOSIS — J3089 Other allergic rhinitis: Secondary | ICD-10-CM | POA: Diagnosis not present

## 2021-06-03 DIAGNOSIS — J3081 Allergic rhinitis due to animal (cat) (dog) hair and dander: Secondary | ICD-10-CM | POA: Diagnosis not present

## 2021-06-03 DIAGNOSIS — J301 Allergic rhinitis due to pollen: Secondary | ICD-10-CM | POA: Diagnosis not present

## 2021-06-09 ENCOUNTER — Ambulatory Visit (AMBULATORY_SURGERY_CENTER): Payer: BC Managed Care – PPO | Admitting: Internal Medicine

## 2021-06-09 ENCOUNTER — Other Ambulatory Visit: Payer: Self-pay | Admitting: Internal Medicine

## 2021-06-09 ENCOUNTER — Encounter: Payer: Self-pay | Admitting: Internal Medicine

## 2021-06-09 VITALS — BP 94/54 | HR 66 | Temp 98.0°F | Resp 13 | Ht 70.0 in | Wt 180.0 lb

## 2021-06-09 DIAGNOSIS — D509 Iron deficiency anemia, unspecified: Secondary | ICD-10-CM | POA: Diagnosis not present

## 2021-06-09 DIAGNOSIS — K2970 Gastritis, unspecified, without bleeding: Secondary | ICD-10-CM | POA: Diagnosis not present

## 2021-06-09 DIAGNOSIS — K5900 Constipation, unspecified: Secondary | ICD-10-CM

## 2021-06-09 DIAGNOSIS — K319 Disease of stomach and duodenum, unspecified: Secondary | ICD-10-CM | POA: Diagnosis not present

## 2021-06-09 DIAGNOSIS — K2100 Gastro-esophageal reflux disease with esophagitis, without bleeding: Secondary | ICD-10-CM | POA: Diagnosis not present

## 2021-06-09 DIAGNOSIS — E611 Iron deficiency: Secondary | ICD-10-CM | POA: Diagnosis not present

## 2021-06-09 HISTORY — PX: COLONOSCOPY WITH ESOPHAGOGASTRODUODENOSCOPY (EGD): SHX5779

## 2021-06-09 MED ORDER — PANTOPRAZOLE SODIUM 40 MG PO TBEC: DELAYED_RELEASE_TABLET | ORAL | 2 refills | Status: DC

## 2021-06-09 MED ORDER — SODIUM CHLORIDE 0.9 % IV SOLN: 500.0000 mL | Freq: Once | INTRAVENOUS | Status: DC

## 2021-06-09 NOTE — Op Note (Signed)
[name] Procedure Date: 06/09/2021 2:38 PM MRN: [id number] Endoscopist: Beverley Fiedler , MD Age: 60 Referring MD:  Date of Birth: 1960/08/11 Gender: Male Account #: [id number] Procedure:                Colonoscopy Indications:              Iron deficiency anemia, normal colonoscopy Jan                            2013; IDA improved with oral iron Medicines:                Monitored Anesthesia Care Procedure:                Pre-Anesthesia Assessment:                           - Prior to the procedure, a History and Physical                            was performed, and patient medications and                            allergies were reviewed. The patient's tolerance of                            previous anesthesia was also reviewed. The risks                            and benefits of the procedure and the sedation                            options and risks were discussed with the patient.                            All questions were answered, and informed consent                            was obtained. Prior Anticoagulants: The patient has                            taken no previous anticoagulant or antiplatelet                            agents. ASA Grade Assessment: II - A patient with                            mild systemic disease. After reviewing the risks                            and benefits, the patient was deemed in                            satisfactory condition to undergo the procedure.  After obtaining informed consent, the colonoscope                            was passed under direct vision. Throughout the                            procedure, the patient's blood pressure, pulse, and                            oxygen saturations were monitored continuously. The                            Olympus CF-[id number]) Colonoscope was                            introduced through the anus and  advanced to the                            cecum, identified by appendiceal orifice and                            ileocecal valve. The colonoscopy was performed                            without difficulty. The patient tolerated the                            procedure well. The quality of the bowel                            preparation was good. The ileocecal valve,                            appendiceal orifice, and rectum were photographed. Scope In: 3:04:00 PM Scope Out: 3:18:22 PM Scope Withdrawal Time: 0 hours 11 minutes 31 seconds  Total Procedure Duration: 0 hours 14 minutes 22 seconds  Findings:                 The digital rectal exam was normal.                           The entire examined colon appeared normal on direct                            and retroflexion views. Complications:            No immediate complications. Estimated Blood Loss:     Estimated blood loss: none. Impression:               - The entire examined colon is normal on direct and                            retroflexion views.                           - No specimens collected. Recommendation:           -  Patient has a contact number available for                            emergencies. The signs and symptoms of potential                            delayed complications were discussed with the                            patient. Return to normal activities tomorrow.                            Written discharge instructions were provided to the                            patient.                           - Resume previous diet.                           - Continue present medications.                           - See other procedure/EGD report from today.                           - Repeat colonoscopy in 10 years for screening                            purposes. Beverley Fiedler, MD 06/09/2021 3:27:11 PM This report has been signed electronically.

## 2021-06-09 NOTE — Op Note (Signed)
[facility] Patient Name: Kenneth Knapp Procedure Date: 06/09/2021 2:39 PM MRN: [id number] Endoscopist: Beverley Fiedler , MD Age: 60 Referring MD:  Date of Birth: 1961/02/17 Gender: Male Account #: [id number] Procedure:                Upper GI endoscopy Indications:              Iron deficiency anemia improved with oral iron Medicines:                Monitored Anesthesia Care Procedure:                Pre-Anesthesia Assessment:                           - Prior to the procedure, a History and Physical                            was performed, and patient medications and                            allergies were reviewed. The patient's tolerance of                            previous anesthesia was also reviewed. The risks                            and benefits of the procedure and the sedation                            options and risks were discussed with the patient.                            All questions were answered, and informed consent                            was obtained. Prior Anticoagulants: The patient has                            taken no previous anticoagulant or antiplatelet                            agents. ASA Grade Assessment: II - A patient with                            mild systemic disease. After reviewing the risks                            and benefits, the patient was deemed in                            satisfactory condition to undergo the procedure.                           After obtaining informed consent, the endoscope was  passed under direct vision. Throughout the                            procedure, the patient's blood pressure, pulse, and                            oxygen saturations were monitored continuously. The                            #[id number] Olympus Endoscope was introduced through the                            mouth, and advanced to the second part of duodenum.                            The  upper GI endoscopy was accomplished without                            difficulty. The patient tolerated the procedure                            well. Scope In: Scope Out: Findings:                 LA Grade A (one or more mucosal breaks less than 5                            mm, not extending between tops of 2 mucosal folds)                            esophagitis with no bleeding was found at the                            gastroesophageal junction.                           A 2 cm hiatal hernia was present.                           Severe inflammation characterized by congestion                            (edema), erosions, erythema and shallow ulcerations                            was found in the gastric antrum. Biopsies were                            taken with a cold forceps for histology and                            Helicobacter pylori testing.                           Moderate inflammation characterized by erosions and  erythema was found in the duodenal bulb.                           The second portion of the duodenum was normal.                            Biopsies for histology were taken with a cold                            forceps for evaluation of celiac disease. Complications:            No immediate complications. Estimated Blood Loss:     Estimated blood loss was minimal. Impression:               - LA Grade A reflux esophagitis with no bleeding.                           - 2 cm hiatal hernia.                           - Gastritis. Biopsied.                           - Duodenitis.                           - Normal second portion of the duodenum. Biopsied. Recommendation:           - Patient has a contact number available for                            emergencies. The signs and symptoms of potential                            delayed complications were discussed with the                            patient. Return to normal activities  tomorrow.                            Written discharge instructions were provided to the                            patient.                           - Resume previous diet.                           - Continue present medications.                           - Pantoprazole 40 mg once daily (best taken 30 min                            before 1st meal of the day) x 8 weeks. NSAID  avoidance.                           - Await pathology results. Beverley Fiedler, MD 06/09/2021 3:25:16 PM This report has been signed electronically.

## 2021-06-09 NOTE — Patient Instructions (Addendum)
YOU HAD AN ENDOSCOPIC PROCEDURE TODAY AT [facility]:   Refer to the procedure report that was given to you for any specific questions about what was found during the examination.  If the procedure report does not answer your questions, please call your gastroenterologist to clarify.  If you requested that your care partner not be given the details of your procedure findings, then the procedure report has been included in a sealed envelope for you to review at your convenience later.  YOU SHOULD EXPECT: Some feelings of bloating in the abdomen. Passage of more gas than usual.  Walking can help get rid of the air that was put into your GI tract during the procedure and reduce the bloating. If you had a lower endoscopy (such as a colonoscopy or flexible sigmoidoscopy) you may notice spotting of blood in your stool or on the toilet paper. If you underwent a bowel prep for your procedure, you may not have a normal bowel movement for a few days.  Please Note:  You might notice some irritation and congestion in your nose or some drainage.  This is from the oxygen used during your procedure.  There is no need for concern and it should clear up in a day or so.  SYMPTOMS TO REPORT IMMEDIATELY:  Following lower endoscopy (colonoscopy or flexible sigmoidoscopy):  Excessive amounts of blood in the stool  Significant tenderness or worsening of abdominal pains  Swelling of the abdomen that is new, acute  Fever of 100F or higher  Following upper endoscopy (EGD)  Vomiting of blood or coffee ground material  New chest pain or pain under the shoulder blades  Painful or persistently difficult swallowing  New shortness of breath  Fever of 100F or higher  Black, tarry-looking stools  For urgent or emergent issues, a gastroenterologist can be reached at any hour by calling [phone]. Do not use MyChart messaging for urgent concerns.    DIET:  We do recommend a small meal at first, but  then you may proceed to your regular diet.  Drink plenty of fluids but you should avoid alcoholic beverages for 24 hours.  ACTIVITY:  You should plan to take it easy for the rest of today and you should NOT DRIVE or use heavy machinery until tomorrow (because of the sedation medicines used during the test).    FOLLOW UP: Our staff will call the number listed on your records 48-72 hours following your procedure to check on you and address any questions or concerns that you may have regarding the information given to you following your procedure. If we do not reach you, we will leave a message.  We will attempt to reach you two times.  During this call, we will ask if you have developed any symptoms of COVID 19. If you develop any symptoms (ie: fever, flu-like symptoms, shortness of breath, cough etc.) before then, please call [phone].  If you test positive for Covid 19 in the 2 weeks post procedure, please call and report this information to [place].    If any biopsies were taken you will be contacted by phone or by letter within the next 1-3 weeks.  Please call us at [phone] if you have not heard about the biopsies in 3 weeks.    SIGNATURES/CONFIDENTIALITY: You and/or your care partner have signed paperwork which will be entered into your electronic medical record.  These signatures attest to the fact that that the information above on your After  Visit Summary has been reviewed and is understood.  Full responsibility of the confidentiality of this discharge information lies with you and/or your care-partner.    AVOID NSAIDS,RESUME REMAINDER OF MEDICATIONS. INFORMATION GIVEN ON GASTRITIS AND ESOPHAGITIS.

## 2021-06-09 NOTE — Progress Notes (Signed)
Called to room to assist during endoscopic procedure.  Patient ID and intended procedure confirmed with present staff. Received instructions for my participation in the procedure from the performing physician.  

## 2021-06-09 NOTE — Progress Notes (Signed)
GASTROENTEROLOGY PROCEDURE H&P NOTE   Primary Care Physician: Kristian Covey, MD    Reason for Procedure:   Kenneth Knapp, change in bowel habits  Plan:    Upper endoscopy and colonoscopy  Patient is appropriate for endoscopic procedure(s) in the ambulatory ([facility]) setting.  The nature of the procedure, as well as the risks, benefits, and alternatives were carefully and thoroughly reviewed with the patient. Ample time for discussion and questions allowed. The patient understood, was satisfied, and agreed to proceed.     HPI: Kenneth Knapp is a 60 y.o. male who presents for EGD and colonoscopy to evaluate change in bowel habits and iron deficiency anemia.  Tolerated the prep.  Seen in the office on 04/28/2021.  No significant changes to medical history since that time.  No chest pain or shortness of breath.  Past Medical History:  Diagnosis Date   AR (allergic rhinitis)    BPH (benign prostatic hyperplasia)    ED (erectile dysfunction)    History of echocardiogram    04-15-2016  mild LVH, ef 60-65%   History of exercise stress test (03-22-2019 per pt had not any cardiac s&s since )   04-10-2016 (cardiology evaluation for precordial pain by dr t. Tresa Endo)  Low risk w/ low specificity (mild positive ekg changes at very high workload and brisk return to normal)   Hyperlipidemia    Hypogonadism male    Rotator cuff tear, right    Wears glasses     Past Surgical History:  Procedure Laterality Date   ARTHOSCOPIC ROTAOR CUFF REPAIR Right 03/23/2019   Procedure: ARTHROSCOPIC ROTATOR CUFF REPAIR; SUBACROMIAL DECOMPRESSION; BICEP TENODISIS;  Surgeon: Jones Broom, MD;  Location: [facility] [place];  Service: Orthopedics;  Laterality: Right;  90 MIN   COLONOSCOPY  2013   LUMBAR DISC SURGERY  2002   L4 -- L5   MASS EXCISION Left 06/08/2017   Procedure: EXCISION CYST BUTTOCK;  Surgeon: Harriette Bouillon, MD;  Location: [facility];  Service: General;  Laterality:  Left;   WISDOM TOOTH EXTRACTION      Prior to Admission medications   Medication Sig Start Date End Date Taking? Authorizing Provider  diclofenac (VOLTAREN) 75 MG EC tablet TAKE 1 TABLET BY MOUTH TWICE A DAY 05/19/21  Yes Burchette, Elberta Fortis, MD  fexofenadine (ALLEGRA) 180 MG tablet Take 180 mg by mouth at bedtime.   Yes [provider]  fluocinonide cream (LIDEX) 0.05 % Apply 1 application topically 2 (two) times daily. 04/15/21  Yes [provider]  hydrocortisone 2.5 % cream Apply 1 application topically 2 (two) times daily. 01/14/21  Yes [provider]  lovastatin (MEVACOR) 20 MG tablet TAKE 1 TABLET BY MOUTH EVERYDAY AT BEDTIME 05/19/21  Yes Burchette, Elberta Fortis, MD  PRESCRIPTION MEDICATION ALLERGY INJECTIONS WEEKLY   Yes [provider]  traZODone (DESYREL) 50 MG tablet TAKE 0.5-1 TABLETS (25-50 MG TOTAL) BY MOUTH AT BEDTIME AS NEEDED FOR SLEEP. 01/10/20  Yes Burchette, Elberta Fortis, MD  traZODone (DESYREL) 50 MG tablet TAKE 1 TO 2 TABLETS BY MOUTH AT BEDTIME AS NEEDED FOR INSOMNIA 05/19/21  Yes Burchette, Elberta Fortis, MD  zolpidem (AMBIEN) 5 MG tablet TAKE 1 TO 2 TABLETS BY MOUTH AT BEDTIME AS NEEDED FOR INSOMNIA 11/22/19  Yes Burchette, Elberta Fortis, MD  albuterol (PROAIR HFA) 108 (90 Base) MCG/ACT inhaler Inhale 2 puffs into the lungs every 6 (six) hours as needed for wheezing or shortness of breath. 05/16/20   Terressa Koyanagi, DO  benzonatate (TESSALON PERLES) 100 MG capsule Take 1 capsule (100 mg total) by mouth 3 (three) times daily as needed. 05/16/20   Terressa Koyanagi, DO  COVID-19 Ad26 vaccine, JANSSEN/J&J, 0.5 ML injection Inject into the muscle. 01/03/21   Judyann Munson, MD  COVID-19 mRNA vaccine, Moderna, 100 MCG/0.5ML injection Inject into the muscle. 05/01/21   Judyann Munson, MD  cyclobenzaprine (FLEXERIL) 10 MG tablet Take 10 mg by mouth as needed for muscle spasms.    [provider]  EPINEPHrine 0.3 mg/0.3 mL IJ SOAJ injection Use as needed 03/24/16    [provider]  Multiple Vitamin (MULTIVITAMIN) tablet Take 1 tablet by mouth daily.    [provider]  sildenafil (VIAGRA) 50 MG tablet TAKE 1 TABLET BY MOUTH 1 HOUR PRIOR TO SEXUAL ACTIVITY 07/11/18   Burchette, Elberta Fortis, MD  testosterone cypionate (DEPOTESTOSTERONE CYPIONATE) 200 MG/ML injection INJECT 1.5 ML (300 MG) INTRAMUSCULAR EVERY 10 DAYS 06/02/21   Burchette, Elberta Fortis, MD    Current Outpatient Medications  Medication Sig Dispense Refill   diclofenac (VOLTAREN) 75 MG EC tablet TAKE 1 TABLET BY MOUTH TWICE A DAY 180 tablet 0   fexofenadine (ALLEGRA) 180 MG tablet Take 180 mg by mouth at bedtime.     fluocinonide cream (LIDEX) 0.05 % Apply 1 application topically 2 (two) times daily.     hydrocortisone 2.5 % cream Apply 1 application topically 2 (two) times daily.     lovastatin (MEVACOR) 20 MG tablet TAKE 1 TABLET BY MOUTH EVERYDAY AT BEDTIME 90 tablet 1   PRESCRIPTION MEDICATION ALLERGY INJECTIONS WEEKLY     traZODone (DESYREL) 50 MG tablet TAKE 0.5-1 TABLETS (25-50 MG TOTAL) BY MOUTH AT BEDTIME AS NEEDED FOR SLEEP. 90 tablet 1   traZODone (DESYREL) 50 MG tablet TAKE 1 TO 2 TABLETS BY MOUTH AT BEDTIME AS NEEDED FOR INSOMNIA 180 tablet 0   zolpidem (AMBIEN) 5 MG tablet TAKE 1 TO 2 TABLETS BY MOUTH AT BEDTIME AS NEEDED FOR INSOMNIA 60 tablet 1   albuterol (PROAIR HFA) 108 (90 Base) MCG/ACT inhaler Inhale 2 puffs into the lungs every 6 (six) hours as needed for wheezing or shortness of breath. 1 each 0   benzonatate (TESSALON PERLES) 100 MG capsule Take 1 capsule (100 mg total) by mouth 3 (three) times daily as needed. 20 capsule 0   COVID-19 Ad26 vaccine, JANSSEN/J&J, 0.5 ML injection Inject into the muscle. 0.5 mL 0   COVID-19 mRNA vaccine, Moderna, 100 MCG/0.5ML injection Inject into the muscle. 0.5 mL 0   cyclobenzaprine (FLEXERIL) 10 MG tablet Take 10 mg by mouth as needed for muscle spasms.     EPINEPHrine 0.3 mg/0.3 mL IJ SOAJ injection Use as needed      Multiple Vitamin (MULTIVITAMIN) tablet Take 1 tablet by mouth daily.     sildenafil (VIAGRA) 50 MG tablet TAKE 1 TABLET BY MOUTH 1 HOUR PRIOR TO SEXUAL ACTIVITY 10 tablet 5   testosterone cypionate (DEPOTESTOSTERONE CYPIONATE) 200 MG/ML injection INJECT 1.5 ML (300 MG) INTRAMUSCULAR EVERY 10 DAYS 10 mL 0   Current Facility-Administered Medications  Medication Dose Route Frequency Provider Last Rate Last Admin   0.9 %  sodium chloride infusion  500 mL Intravenous Once Taleeya Blondin, Carie Caddy, MD        Allergies as of 06/09/2021 - Review Complete 06/09/2021  Allergen Reaction Noted   No known allergies  09/03/2020    Family History  Problem Relation Age of Onset   ALS Mother    Arthritis Mother  Heart disease Father    Stroke Father 20       hemorrhagic   Colon cancer Paternal Grandmother    Heart disease Paternal Grandfather    Stomach cancer Neg Hx    Rectal cancer Neg Hx    Esophageal cancer Neg Hx     Social History   Socioeconomic History   Marital status: Divorced    Spouse name: Not on file   Number of children: Not on file   Years of education: Not on file   Highest education level: Not on file  Occupational History   Not on file  Tobacco Use   Smoking status: Never   Smokeless tobacco: Never  Vaping Use   Vaping Use: Never used  Substance and Sexual Activity   Alcohol use: Yes    Comment: 1 DRINK PER DAY   Drug use: Never   Sexual activity: Not on file  Other Topics Concern   Not on file  Social History Narrative   Not on file   Social Determinants of Health   Financial Resource Strain: Not on file  Food Insecurity: Not on file  Transportation Needs: Not on file  Physical Activity: Not on file  Stress: Not on file  Social Connections: Not on file  Intimate Partner Violence: Not on file    Physical Exam: Vital signs in last 24 hours: @BP  128/82   Pulse 69   Temp 98 F (36.7 C) (Temporal)   Ht 5\' 10"  (1.778 m)   Wt 180 lb (81.6 kg)   SpO2 97%    BMI 25.83 kg/m  GEN: NAD EYE: Sclerae anicteric ENT: MMM CV: Non-tachycardic Pulm: CTA b/l GI: Soft, NT/ND NEURO:  Alert & Oriented x 3   , MD [facility]  06/09/2021 2:41 PM

## 2021-06-09 NOTE — Progress Notes (Signed)
Pt's states no medical or surgical changes since previsit or office visit. 

## 2021-06-09 NOTE — Progress Notes (Signed)
Sedate, gd SR, tolerated procedure well, VSS, report to RN 

## 2021-06-10 DIAGNOSIS — J3089 Other allergic rhinitis: Secondary | ICD-10-CM | POA: Diagnosis not present

## 2021-06-10 DIAGNOSIS — J301 Allergic rhinitis due to pollen: Secondary | ICD-10-CM | POA: Diagnosis not present

## 2021-06-10 DIAGNOSIS — J3081 Allergic rhinitis due to animal (cat) (dog) hair and dander: Secondary | ICD-10-CM | POA: Diagnosis not present

## 2021-06-11 ENCOUNTER — Telehealth: Payer: Self-pay

## 2021-06-11 NOTE — Telephone Encounter (Signed)
  Follow up Call-  Call back number 06/09/2021  Post procedure Call Back phone  # [phone]  Permission to leave phone message Yes  Some recent data might be hidden    Left message

## 2021-06-16 DIAGNOSIS — J3089 Other allergic rhinitis: Secondary | ICD-10-CM | POA: Diagnosis not present

## 2021-06-16 DIAGNOSIS — J301 Allergic rhinitis due to pollen: Secondary | ICD-10-CM | POA: Diagnosis not present

## 2021-06-16 DIAGNOSIS — J3081 Allergic rhinitis due to animal (cat) (dog) hair and dander: Secondary | ICD-10-CM | POA: Diagnosis not present

## 2021-06-18 ENCOUNTER — Encounter: Payer: Self-pay | Admitting: Internal Medicine

## 2021-06-20 DIAGNOSIS — J3089 Other allergic rhinitis: Secondary | ICD-10-CM | POA: Diagnosis not present

## 2021-06-20 DIAGNOSIS — J3081 Allergic rhinitis due to animal (cat) (dog) hair and dander: Secondary | ICD-10-CM | POA: Diagnosis not present

## 2021-06-20 DIAGNOSIS — J301 Allergic rhinitis due to pollen: Secondary | ICD-10-CM | POA: Diagnosis not present

## 2021-06-23 DIAGNOSIS — J3081 Allergic rhinitis due to animal (cat) (dog) hair and dander: Secondary | ICD-10-CM | POA: Diagnosis not present

## 2021-06-23 DIAGNOSIS — J301 Allergic rhinitis due to pollen: Secondary | ICD-10-CM | POA: Diagnosis not present

## 2021-06-23 DIAGNOSIS — J3089 Other allergic rhinitis: Secondary | ICD-10-CM | POA: Diagnosis not present

## 2021-07-01 DIAGNOSIS — J3081 Allergic rhinitis due to animal (cat) (dog) hair and dander: Secondary | ICD-10-CM | POA: Diagnosis not present

## 2021-07-01 DIAGNOSIS — J3089 Other allergic rhinitis: Secondary | ICD-10-CM | POA: Diagnosis not present

## 2021-07-01 DIAGNOSIS — J301 Allergic rhinitis due to pollen: Secondary | ICD-10-CM | POA: Diagnosis not present

## 2021-07-07 DIAGNOSIS — J3089 Other allergic rhinitis: Secondary | ICD-10-CM | POA: Diagnosis not present

## 2021-07-07 DIAGNOSIS — J3081 Allergic rhinitis due to animal (cat) (dog) hair and dander: Secondary | ICD-10-CM | POA: Diagnosis not present

## 2021-07-07 DIAGNOSIS — J301 Allergic rhinitis due to pollen: Secondary | ICD-10-CM | POA: Diagnosis not present

## 2021-07-22 DIAGNOSIS — J3081 Allergic rhinitis due to animal (cat) (dog) hair and dander: Secondary | ICD-10-CM | POA: Diagnosis not present

## 2021-07-22 DIAGNOSIS — J301 Allergic rhinitis due to pollen: Secondary | ICD-10-CM | POA: Diagnosis not present

## 2021-07-22 DIAGNOSIS — J3089 Other allergic rhinitis: Secondary | ICD-10-CM | POA: Diagnosis not present

## 2021-07-28 DIAGNOSIS — J3081 Allergic rhinitis due to animal (cat) (dog) hair and dander: Secondary | ICD-10-CM | POA: Diagnosis not present

## 2021-07-28 DIAGNOSIS — J301 Allergic rhinitis due to pollen: Secondary | ICD-10-CM | POA: Diagnosis not present

## 2021-07-28 DIAGNOSIS — J3089 Other allergic rhinitis: Secondary | ICD-10-CM | POA: Diagnosis not present

## 2021-08-04 DIAGNOSIS — J301 Allergic rhinitis due to pollen: Secondary | ICD-10-CM | POA: Diagnosis not present

## 2021-08-04 DIAGNOSIS — J3081 Allergic rhinitis due to animal (cat) (dog) hair and dander: Secondary | ICD-10-CM | POA: Diagnosis not present

## 2021-08-05 DIAGNOSIS — J301 Allergic rhinitis due to pollen: Secondary | ICD-10-CM | POA: Diagnosis not present

## 2021-08-06 DIAGNOSIS — J3081 Allergic rhinitis due to animal (cat) (dog) hair and dander: Secondary | ICD-10-CM | POA: Diagnosis not present

## 2021-08-06 DIAGNOSIS — J3089 Other allergic rhinitis: Secondary | ICD-10-CM | POA: Diagnosis not present

## 2021-08-18 DIAGNOSIS — J301 Allergic rhinitis due to pollen: Secondary | ICD-10-CM | POA: Diagnosis not present

## 2021-08-18 DIAGNOSIS — J3089 Other allergic rhinitis: Secondary | ICD-10-CM | POA: Diagnosis not present

## 2021-08-18 DIAGNOSIS — J3081 Allergic rhinitis due to animal (cat) (dog) hair and dander: Secondary | ICD-10-CM | POA: Diagnosis not present

## 2021-08-27 DIAGNOSIS — J301 Allergic rhinitis due to pollen: Secondary | ICD-10-CM | POA: Diagnosis not present

## 2021-08-27 DIAGNOSIS — J3081 Allergic rhinitis due to animal (cat) (dog) hair and dander: Secondary | ICD-10-CM | POA: Diagnosis not present

## 2021-08-27 DIAGNOSIS — J3089 Other allergic rhinitis: Secondary | ICD-10-CM | POA: Diagnosis not present

## 2021-08-28 ENCOUNTER — Other Ambulatory Visit: Payer: Self-pay | Admitting: Family Medicine

## 2021-08-28 LAB — BASIC METABOLIC PANEL
BUN: 13 (ref 4–21)
CO2: 23 — AB (ref 13–22)
Chloride: 102 (ref 99–108)
Creatinine: 1.1 (ref 0.6–1.3)
Glucose: 99
Potassium: 4.5 (ref 3.4–5.3)
Sodium: 140 (ref 137–147)

## 2021-08-28 LAB — IRON,TIBC AND FERRITIN PANEL
Iron: 84
TIBC: 351
UIBC: 267

## 2021-08-28 LAB — CBC: RBC: 5.21 — AB (ref 3.87–5.11)

## 2021-08-28 LAB — LIPID PANEL
Cholesterol: 151 (ref 0–200)
HDL: 51 (ref 35–70)
LDL Cholesterol: 81
Triglycerides: 102 (ref 40–160)

## 2021-08-28 LAB — PSA: PSA: 1.4

## 2021-08-28 LAB — HEPATIC FUNCTION PANEL
ALT: 32 (ref 10–40)
AST: 30 (ref 14–40)
Alkaline Phosphatase: 73 (ref 25–125)
Bilirubin, Total: 0.4

## 2021-08-28 LAB — COMPREHENSIVE METABOLIC PANEL
Albumin: 4.4 (ref 3.5–5.0)
Calcium: 9.2 (ref 8.7–10.7)
Globulin: 2.1
eGFR: 76

## 2021-08-28 LAB — CBC AND DIFFERENTIAL
HCT: 49 (ref 41–53)
Hemoglobin: 17 (ref 13.5–17.5)
Platelets: 206 (ref 150–399)
WBC: 6.2

## 2021-08-28 LAB — TSH: TSH: 4.83 (ref 0.41–5.90)

## 2021-08-28 NOTE — Telephone Encounter (Signed)
Last filled 06/02/2021 Last OV 12/23/2020  Ok to fill?

## 2021-09-03 ENCOUNTER — Encounter: Payer: Self-pay | Admitting: Family Medicine

## 2021-09-03 ENCOUNTER — Other Ambulatory Visit: Payer: Self-pay | Admitting: Family Medicine

## 2021-09-03 DIAGNOSIS — J301 Allergic rhinitis due to pollen: Secondary | ICD-10-CM | POA: Diagnosis not present

## 2021-09-03 DIAGNOSIS — J3089 Other allergic rhinitis: Secondary | ICD-10-CM | POA: Diagnosis not present

## 2021-09-03 DIAGNOSIS — J3081 Allergic rhinitis due to animal (cat) (dog) hair and dander: Secondary | ICD-10-CM | POA: Diagnosis not present

## 2021-09-03 NOTE — Telephone Encounter (Signed)
Last filled 08/29/2021 Testosterone levels last checked 02/20/2020  Ok to fill?

## 2021-09-05 DIAGNOSIS — J3081 Allergic rhinitis due to animal (cat) (dog) hair and dander: Secondary | ICD-10-CM | POA: Diagnosis not present

## 2021-09-05 DIAGNOSIS — J301 Allergic rhinitis due to pollen: Secondary | ICD-10-CM | POA: Diagnosis not present

## 2021-09-05 DIAGNOSIS — J3089 Other allergic rhinitis: Secondary | ICD-10-CM | POA: Diagnosis not present

## 2021-09-08 DIAGNOSIS — J301 Allergic rhinitis due to pollen: Secondary | ICD-10-CM | POA: Diagnosis not present

## 2021-09-11 DIAGNOSIS — J3081 Allergic rhinitis due to animal (cat) (dog) hair and dander: Secondary | ICD-10-CM | POA: Diagnosis not present

## 2021-09-11 DIAGNOSIS — J301 Allergic rhinitis due to pollen: Secondary | ICD-10-CM | POA: Diagnosis not present

## 2021-09-11 DIAGNOSIS — J3089 Other allergic rhinitis: Secondary | ICD-10-CM | POA: Diagnosis not present

## 2021-09-15 ENCOUNTER — Encounter: Payer: Self-pay | Admitting: Family Medicine

## 2021-09-16 ENCOUNTER — Encounter: Payer: Self-pay | Admitting: Family Medicine

## 2021-09-16 DIAGNOSIS — J3089 Other allergic rhinitis: Secondary | ICD-10-CM | POA: Diagnosis not present

## 2021-09-16 DIAGNOSIS — J301 Allergic rhinitis due to pollen: Secondary | ICD-10-CM | POA: Diagnosis not present

## 2021-09-16 DIAGNOSIS — J3081 Allergic rhinitis due to animal (cat) (dog) hair and dander: Secondary | ICD-10-CM | POA: Diagnosis not present

## 2021-09-16 NOTE — Progress Notes (Signed)
ab 

## 2021-09-23 DIAGNOSIS — J3089 Other allergic rhinitis: Secondary | ICD-10-CM | POA: Diagnosis not present

## 2021-09-23 DIAGNOSIS — J301 Allergic rhinitis due to pollen: Secondary | ICD-10-CM | POA: Diagnosis not present

## 2021-09-23 DIAGNOSIS — J3081 Allergic rhinitis due to animal (cat) (dog) hair and dander: Secondary | ICD-10-CM | POA: Diagnosis not present

## 2021-09-29 ENCOUNTER — Encounter: Payer: Self-pay | Admitting: Podiatry

## 2021-09-29 ENCOUNTER — Ambulatory Visit (INDEPENDENT_AMBULATORY_CARE_PROVIDER_SITE_OTHER): Payer: BC Managed Care – PPO

## 2021-09-29 ENCOUNTER — Other Ambulatory Visit: Payer: Self-pay

## 2021-09-29 ENCOUNTER — Ambulatory Visit: Payer: BC Managed Care – PPO | Admitting: Podiatry

## 2021-09-29 DIAGNOSIS — M779 Enthesopathy, unspecified: Secondary | ICD-10-CM | POA: Diagnosis not present

## 2021-09-29 NOTE — Progress Notes (Signed)
Subjective:  ? ?Patient ID: Kenneth Knapp, male   DOB: 61 y.o.   MRN: [id number]  ? ?HPI ?Patient states he traumatized his right foot about 3 weeks ago and it has been sore and while it is improving he is concerned about some injury he may have obtained and he wants to make sure there is no fracture.  Patient does not smoke likes to be very active ? ? ?Review of Systems  ?All other systems reviewed and are negative. ? ? ?   ?Objective:  ?Physical Exam ?Vitals and nursing note reviewed.  ?Constitutional:   ?   Appearance: He is well-developed.  ?Pulmonary:  ?   Effort: Pulmonary effort is normal.  ?Musculoskeletal:     ?   General: Normal range of motion.  ?Skin: ?   General: Skin is warm.  ?Neurological:  ?   Mental Status: He is alert.  ?  ?Neurovascular status intact muscle strength found to be adequate range of motion adequate with patient found to have mild inflammation around the right forefoot medial side that is low-grade currently with no active swelling no range of motion loss of the first MPJ ? ?   ?Assessment:  ?Probability for injury to the right foot that does seem to be doing well cannot rule out fracture ? ?   ?Plan:  ?H&P reviewed condition careful final x-ray taken today and patient can resume normal activities in the next week or 2 but I did encourage anti-inflammatories as needed for several weeks along with ice therapy.  Reappoint as needed ? ?X-rays were negative for signs of fracture or any bony pathology associated with this condition ?   ? ? ?

## 2021-10-02 DIAGNOSIS — J3089 Other allergic rhinitis: Secondary | ICD-10-CM | POA: Diagnosis not present

## 2021-10-02 DIAGNOSIS — J301 Allergic rhinitis due to pollen: Secondary | ICD-10-CM | POA: Diagnosis not present

## 2021-10-02 DIAGNOSIS — J3081 Allergic rhinitis due to animal (cat) (dog) hair and dander: Secondary | ICD-10-CM | POA: Diagnosis not present

## 2021-10-07 DIAGNOSIS — J301 Allergic rhinitis due to pollen: Secondary | ICD-10-CM | POA: Diagnosis not present

## 2021-10-07 DIAGNOSIS — J3081 Allergic rhinitis due to animal (cat) (dog) hair and dander: Secondary | ICD-10-CM | POA: Diagnosis not present

## 2021-10-07 DIAGNOSIS — J3089 Other allergic rhinitis: Secondary | ICD-10-CM | POA: Diagnosis not present

## 2021-10-12 ENCOUNTER — Other Ambulatory Visit: Payer: Self-pay | Admitting: Family Medicine

## 2021-10-14 DIAGNOSIS — J3081 Allergic rhinitis due to animal (cat) (dog) hair and dander: Secondary | ICD-10-CM | POA: Diagnosis not present

## 2021-10-14 DIAGNOSIS — J301 Allergic rhinitis due to pollen: Secondary | ICD-10-CM | POA: Diagnosis not present

## 2021-10-14 DIAGNOSIS — J3089 Other allergic rhinitis: Secondary | ICD-10-CM | POA: Diagnosis not present

## 2021-10-20 DIAGNOSIS — J301 Allergic rhinitis due to pollen: Secondary | ICD-10-CM | POA: Diagnosis not present

## 2021-10-20 DIAGNOSIS — J3081 Allergic rhinitis due to animal (cat) (dog) hair and dander: Secondary | ICD-10-CM | POA: Diagnosis not present

## 2021-10-20 DIAGNOSIS — J3089 Other allergic rhinitis: Secondary | ICD-10-CM | POA: Diagnosis not present

## 2021-10-28 DIAGNOSIS — J3081 Allergic rhinitis due to animal (cat) (dog) hair and dander: Secondary | ICD-10-CM | POA: Diagnosis not present

## 2021-10-28 DIAGNOSIS — J3089 Other allergic rhinitis: Secondary | ICD-10-CM | POA: Diagnosis not present

## 2021-10-28 DIAGNOSIS — J301 Allergic rhinitis due to pollen: Secondary | ICD-10-CM | POA: Diagnosis not present

## 2021-10-29 ENCOUNTER — Ambulatory Visit: Payer: BC Managed Care – PPO | Admitting: Podiatry

## 2021-10-29 ENCOUNTER — Encounter: Payer: Self-pay | Admitting: Podiatry

## 2021-10-29 DIAGNOSIS — L600 Ingrowing nail: Secondary | ICD-10-CM

## 2021-10-29 NOTE — Patient Instructions (Signed)
Place 1/4 cup of epsom salts in a quart of warm tap water.  Submerge your foot or feet in the solution and soak for 20 minutes.  This soak should be done twice a day.  Next, remove your foot or feet from solution, blot dry the affected area. Apply ointment and cover if instructed by your doctor.  ? ?IF YOUR SKIN BECOMES IRRITATED WHILE USING THESE INSTRUCTIONS, IT IS OKAY TO SWITCH TO  WHITE VINEGAR AND WATER.  ?As another alternative soak, you may use antibacterial soap and water. ? ?Monitor for any signs/symptoms of infection. Call the office immediately if any occur or go directly to the emergency room. Call with any questions/concerns.  ? ?                                        Ingrown Toenail ?An ingrown toenail occurs when the corner or sides of a toenail grow into the surrounding skin. This causes discomfort and pain. The big toe is most commonly affected, but any of the toes can be affected. If an ingrown toenail is not treated, it can become infected. ?What are the causes? ?This condition may be caused by: ?Wearing shoes that are too small or tight. ?An injury, such as stubbing your toe or having your toe stepped on. ?Improper cutting or care of your toenails. ?Having nail or foot abnormalities that were present from birth (congenital abnormalities), such as having a nail that is too big for your toe. ?What increases the risk? ?The following factors may make you more likely to develop ingrown toenails: ?Age. Nails tend to get thicker with age, so ingrown nails are more common among older people. ?Cutting your toenails incorrectly, such as cutting them very short or cutting them unevenly. ?An ingrown toenail is more likely to get infected if you have: ?Diabetes. ?Blood flow (circulation) problems. ?What are the signs or symptoms? ?Symptoms of an ingrown toenail may include: ?Pain, soreness, or tenderness. ?Redness. ?Swelling. ?Hardening of the skin that surrounds the toenail. ?Signs that an ingrown toenail  may be infected include: ?Fluid or pus. ?Symptoms that get worse. ?How is this diagnosed? ?Ingrown toenails may be diagnosed based on: ?Your symptoms and medical history. ?A physical exam. ?Labs or tests. If you have fluid or blood coming from your toenail, a sample may be collected to test for the specific type of bacteria that is causing the infection. ?How is this treated? ?Treatment depends on the severity of your symptoms. You may be able to care for your toenail at home. ?If you have an infection, you may be prescribed antibiotic medicines. ?If you have fluid or pus draining from your toenail, your health care provider may drain it. ?If you have trouble walking, you may be given crutches to use. ?If you have a severe or infected ingrown toenail, you may need a procedure to remove part or all of the nail. ?Follow these instructions at home: ?Foot care ? ?Check your wound every day for signs of infection, or as often as told by your health care provider. Check for: ?More redness, swelling, or pain. ?More fluid or blood. ?Warmth. ?Pus or a bad smell. ?Do not pick at your toenail or try to remove it yourself. ?Soak your foot in warm, soapy water. Do this for 20 minutes, 3 times a day, or as often as told by your health care provider. This helps to   keep your toe clean and your skin soft. ?Wear shoes that fit well and are not too tight. Your health care provider may recommend that you wear open-toed shoes while you heal. ?Trim your toenails regularly and carefully. Cut your toenails straight across to prevent injury to the skin at the corners of the toenail. Do not cut your nails in a curved shape. ?Keep your feet clean and dry to help prevent infection. ?General instructions ?Take over-the-counter and prescription medicines only as told by your health care provider. ?If you were prescribed an antibiotic, take it as told by your health care provider. Do not stop taking the antibiotic even if you start to feel  better. ?If your health care provider told you to use crutches to help you move around, use them as instructed. ?Return to your normal activities as told by your health care provider. Ask your health care provider what activities are safe for you. ?Keep all follow-up visits. This is important. ?Contact a health care provider if: ?You have more redness, swelling, pain, or other symptoms that do not improve with treatment. ?You have fluid, blood, or pus coming from your toenail. ?You have a red streak on your skin that starts at your foot and spreads up your leg. ?You have a fever. ?Summary ?An ingrown toenail occurs when the corner or sides of a toenail grow into the surrounding skin. This causes discomfort and pain. The big toe is most commonly affected, but any of the toes can be affected. ?If an ingrown toenail is not treated, it can become infected. ?Fluid or pus draining from your toenail is a sign of infection. Your health care provider may need to drain it. You may be given antibiotics to treat the infection. ?Trimming your toenails regularly and properly can help you prevent an ingrown toenail. ?This information is not intended to replace advice given to you by your health care provider. Make sure you discuss any questions you have with your health care provider. ?Document Revised: 11/05/2020 Document Reviewed: 11/05/2020 ?Elsevier Patient Education ? 2022 [employer] ? ?

## 2021-10-30 NOTE — Progress Notes (Signed)
Subjective:  ? ?Patient ID: Kenneth Knapp, male   DOB: 61 y.o.   MRN: [id number]  ? ?HPI ?Patient presents stating his right foot is feeling good but he has an ingrown toenail on his left big toe that has been bothering him he forgot to mention and is sore especially at night ? ? ?ROS ? ? ?   ?Objective:  ?Physical Exam  ?Neurovascular status intact with right foot improved from previous treatment with incurvated medial border left hallux painful when pressed ? ?   ?Assessment:  ?Ingrown toenail deformity left hallux medial border with pain no redness no erythema edema noted and improved capsulitis right ? ?   ?Plan:  ?H&P reviewed condition recommended correction of ingrown and allowed him to read and sign consent form understanding risk.  Today I infiltrated the left hallux 60 mg Xylocaine Marcaine mixture sterile prep done and using sterile instrumentation I remove the medial border exposed matrix applied phenol 3 applications 30 seconds followed by alcohol lavage sterile dressing gave instructions for soaks and to leave dressing on 24 hours but take it off earlier if any throbbing were to occur.  Encouraged him to call with questions concerns which may arise ?   ? ? ?

## 2021-11-01 ENCOUNTER — Other Ambulatory Visit: Payer: Self-pay | Admitting: Family Medicine

## 2021-11-01 ENCOUNTER — Other Ambulatory Visit: Payer: Self-pay | Admitting: Internal Medicine

## 2021-11-01 DIAGNOSIS — K2970 Gastritis, unspecified, without bleeding: Secondary | ICD-10-CM

## 2021-11-10 DIAGNOSIS — J3081 Allergic rhinitis due to animal (cat) (dog) hair and dander: Secondary | ICD-10-CM | POA: Diagnosis not present

## 2021-11-10 DIAGNOSIS — J3089 Other allergic rhinitis: Secondary | ICD-10-CM | POA: Diagnosis not present

## 2021-11-10 DIAGNOSIS — J301 Allergic rhinitis due to pollen: Secondary | ICD-10-CM | POA: Diagnosis not present

## 2021-11-17 DIAGNOSIS — J3089 Other allergic rhinitis: Secondary | ICD-10-CM | POA: Diagnosis not present

## 2021-11-17 DIAGNOSIS — J301 Allergic rhinitis due to pollen: Secondary | ICD-10-CM | POA: Diagnosis not present

## 2021-11-17 DIAGNOSIS — J3081 Allergic rhinitis due to animal (cat) (dog) hair and dander: Secondary | ICD-10-CM | POA: Diagnosis not present

## 2021-11-19 ENCOUNTER — Other Ambulatory Visit: Payer: Self-pay | Admitting: Internal Medicine

## 2021-11-19 ENCOUNTER — Other Ambulatory Visit: Payer: Self-pay | Admitting: Family Medicine

## 2021-11-19 DIAGNOSIS — K2970 Gastritis, unspecified, without bleeding: Secondary | ICD-10-CM

## 2021-11-20 ENCOUNTER — Encounter: Payer: Self-pay | Admitting: Internal Medicine

## 2021-12-01 ENCOUNTER — Other Ambulatory Visit: Payer: Self-pay

## 2021-12-01 MED ORDER — PANTOPRAZOLE SODIUM 40 MG PO TBEC: 40.0000 mg | DELAYED_RELEASE_TABLET | Freq: Every day | ORAL | 3 refills | Status: DC

## 2021-12-01 NOTE — Telephone Encounter (Signed)
Please refill pantoprazole 40 mg daily, #90 with 1 year of refills ?Thanks ?JMP ? ?

## 2021-12-04 DIAGNOSIS — J3081 Allergic rhinitis due to animal (cat) (dog) hair and dander: Secondary | ICD-10-CM | POA: Diagnosis not present

## 2021-12-04 DIAGNOSIS — J3089 Other allergic rhinitis: Secondary | ICD-10-CM | POA: Diagnosis not present

## 2021-12-04 DIAGNOSIS — J301 Allergic rhinitis due to pollen: Secondary | ICD-10-CM | POA: Diagnosis not present

## 2021-12-08 DIAGNOSIS — J3081 Allergic rhinitis due to animal (cat) (dog) hair and dander: Secondary | ICD-10-CM | POA: Diagnosis not present

## 2021-12-08 DIAGNOSIS — J301 Allergic rhinitis due to pollen: Secondary | ICD-10-CM | POA: Diagnosis not present

## 2021-12-08 DIAGNOSIS — J3089 Other allergic rhinitis: Secondary | ICD-10-CM | POA: Diagnosis not present

## 2021-12-09 ENCOUNTER — Encounter: Payer: Self-pay | Admitting: Family Medicine

## 2021-12-09 MED ORDER — ZOLPIDEM TARTRATE 5 MG PO TABS: ORAL_TABLET | ORAL | 1 refills | Status: DC

## 2021-12-09 MED ORDER — SILDENAFIL CITRATE 50 MG PO TABS: ORAL_TABLET | ORAL | 5 refills | Status: DC

## 2021-12-09 NOTE — Telephone Encounter (Signed)
Refills sent

## 2021-12-16 DIAGNOSIS — J301 Allergic rhinitis due to pollen: Secondary | ICD-10-CM | POA: Diagnosis not present

## 2021-12-16 DIAGNOSIS — J3081 Allergic rhinitis due to animal (cat) (dog) hair and dander: Secondary | ICD-10-CM | POA: Diagnosis not present

## 2021-12-16 DIAGNOSIS — J3089 Other allergic rhinitis: Secondary | ICD-10-CM | POA: Diagnosis not present

## 2021-12-21 ENCOUNTER — Other Ambulatory Visit: Payer: Self-pay | Admitting: Family Medicine

## 2021-12-22 DIAGNOSIS — J3089 Other allergic rhinitis: Secondary | ICD-10-CM | POA: Diagnosis not present

## 2021-12-22 DIAGNOSIS — J3081 Allergic rhinitis due to animal (cat) (dog) hair and dander: Secondary | ICD-10-CM | POA: Diagnosis not present

## 2021-12-22 DIAGNOSIS — J301 Allergic rhinitis due to pollen: Secondary | ICD-10-CM | POA: Diagnosis not present

## 2022-01-05 DIAGNOSIS — J3081 Allergic rhinitis due to animal (cat) (dog) hair and dander: Secondary | ICD-10-CM | POA: Diagnosis not present

## 2022-01-05 DIAGNOSIS — J301 Allergic rhinitis due to pollen: Secondary | ICD-10-CM | POA: Diagnosis not present

## 2022-01-05 DIAGNOSIS — J3089 Other allergic rhinitis: Secondary | ICD-10-CM | POA: Diagnosis not present

## 2022-01-12 DIAGNOSIS — J301 Allergic rhinitis due to pollen: Secondary | ICD-10-CM | POA: Diagnosis not present

## 2022-01-12 DIAGNOSIS — J3089 Other allergic rhinitis: Secondary | ICD-10-CM | POA: Diagnosis not present

## 2022-01-12 DIAGNOSIS — J3081 Allergic rhinitis due to animal (cat) (dog) hair and dander: Secondary | ICD-10-CM | POA: Diagnosis not present

## 2022-01-23 ENCOUNTER — Other Ambulatory Visit: Payer: Self-pay | Admitting: Family Medicine

## 2022-01-23 DIAGNOSIS — J3089 Other allergic rhinitis: Secondary | ICD-10-CM | POA: Diagnosis not present

## 2022-01-23 DIAGNOSIS — J301 Allergic rhinitis due to pollen: Secondary | ICD-10-CM | POA: Diagnosis not present

## 2022-01-23 DIAGNOSIS — J3081 Allergic rhinitis due to animal (cat) (dog) hair and dander: Secondary | ICD-10-CM | POA: Diagnosis not present

## 2022-01-23 NOTE — Telephone Encounter (Signed)
Last OV- 12/23/2020* Last refill- 11/20/2021  No future OV scheduled.    Can this patient receive a refill?   Will contact to schedule appointment.

## 2022-01-27 ENCOUNTER — Other Ambulatory Visit: Payer: Self-pay | Admitting: Family Medicine

## 2022-02-02 DIAGNOSIS — J3081 Allergic rhinitis due to animal (cat) (dog) hair and dander: Secondary | ICD-10-CM | POA: Diagnosis not present

## 2022-02-02 DIAGNOSIS — J3089 Other allergic rhinitis: Secondary | ICD-10-CM | POA: Diagnosis not present

## 2022-02-02 DIAGNOSIS — J301 Allergic rhinitis due to pollen: Secondary | ICD-10-CM | POA: Diagnosis not present

## 2022-02-09 DIAGNOSIS — J3089 Other allergic rhinitis: Secondary | ICD-10-CM | POA: Diagnosis not present

## 2022-02-09 DIAGNOSIS — J3081 Allergic rhinitis due to animal (cat) (dog) hair and dander: Secondary | ICD-10-CM | POA: Diagnosis not present

## 2022-02-09 DIAGNOSIS — J301 Allergic rhinitis due to pollen: Secondary | ICD-10-CM | POA: Diagnosis not present

## 2022-02-12 LAB — CBC
RBC: 5.16 — AB (ref 3.87–5.11)
RBC: 5.16 — AB (ref 3.87–5.11)

## 2022-02-12 LAB — HEPATIC FUNCTION PANEL
ALT: 31 U/L (ref 10–40)
AST: 32 (ref 14–40)
Alkaline Phosphatase: 72 (ref 25–125)

## 2022-02-12 LAB — HEMOGLOBIN A1C: Hemoglobin A1C: 5.4

## 2022-02-12 LAB — CBC AND DIFFERENTIAL
HCT: 48 (ref 41–53)
Hemoglobin: 16.3 (ref 13.5–17.5)
Neutrophils Absolute: 3.8
Platelets: 246 10*3/uL (ref 150–400)
WBC: 6

## 2022-02-12 LAB — BASIC METABOLIC PANEL
BUN: 17 (ref 4–21)
CO2: 24 — AB (ref 13–22)
Creatinine: 1.1 (ref 0.6–1.3)
Glucose: 93

## 2022-02-12 LAB — TESTOSTERONE: Testosterone: 1187

## 2022-02-12 LAB — COMPREHENSIVE METABOLIC PANEL: eGFR: 77

## 2022-02-16 DIAGNOSIS — J3089 Other allergic rhinitis: Secondary | ICD-10-CM | POA: Diagnosis not present

## 2022-02-16 DIAGNOSIS — J301 Allergic rhinitis due to pollen: Secondary | ICD-10-CM | POA: Diagnosis not present

## 2022-02-16 DIAGNOSIS — J3081 Allergic rhinitis due to animal (cat) (dog) hair and dander: Secondary | ICD-10-CM | POA: Diagnosis not present

## 2022-02-23 DIAGNOSIS — J301 Allergic rhinitis due to pollen: Secondary | ICD-10-CM | POA: Diagnosis not present

## 2022-02-23 DIAGNOSIS — J3089 Other allergic rhinitis: Secondary | ICD-10-CM | POA: Diagnosis not present

## 2022-02-23 DIAGNOSIS — J3081 Allergic rhinitis due to animal (cat) (dog) hair and dander: Secondary | ICD-10-CM | POA: Diagnosis not present

## 2022-02-24 ENCOUNTER — Encounter: Payer: Self-pay | Admitting: Family Medicine

## 2022-02-24 MED ORDER — TESTOSTERONE CYPIONATE 200 MG/ML IM SOLN: INTRAMUSCULAR | 0 refills | Status: DC

## 2022-02-24 NOTE — Telephone Encounter (Signed)
Agree with reducing testosterone to 300 mg every 14 days.  New prescription sent in reflecting this.

## 2022-03-02 DIAGNOSIS — J3089 Other allergic rhinitis: Secondary | ICD-10-CM | POA: Diagnosis not present

## 2022-03-02 DIAGNOSIS — J301 Allergic rhinitis due to pollen: Secondary | ICD-10-CM | POA: Diagnosis not present

## 2022-03-02 DIAGNOSIS — J3081 Allergic rhinitis due to animal (cat) (dog) hair and dander: Secondary | ICD-10-CM | POA: Diagnosis not present

## 2022-03-09 DIAGNOSIS — J3089 Other allergic rhinitis: Secondary | ICD-10-CM | POA: Diagnosis not present

## 2022-03-09 DIAGNOSIS — J301 Allergic rhinitis due to pollen: Secondary | ICD-10-CM | POA: Diagnosis not present

## 2022-03-09 DIAGNOSIS — J3081 Allergic rhinitis due to animal (cat) (dog) hair and dander: Secondary | ICD-10-CM | POA: Diagnosis not present

## 2022-03-16 DIAGNOSIS — J3081 Allergic rhinitis due to animal (cat) (dog) hair and dander: Secondary | ICD-10-CM | POA: Diagnosis not present

## 2022-03-16 DIAGNOSIS — J301 Allergic rhinitis due to pollen: Secondary | ICD-10-CM | POA: Diagnosis not present

## 2022-03-16 DIAGNOSIS — J3089 Other allergic rhinitis: Secondary | ICD-10-CM | POA: Diagnosis not present

## 2022-03-24 DIAGNOSIS — J3081 Allergic rhinitis due to animal (cat) (dog) hair and dander: Secondary | ICD-10-CM | POA: Diagnosis not present

## 2022-03-24 DIAGNOSIS — J301 Allergic rhinitis due to pollen: Secondary | ICD-10-CM | POA: Diagnosis not present

## 2022-03-24 DIAGNOSIS — J3089 Other allergic rhinitis: Secondary | ICD-10-CM | POA: Diagnosis not present

## 2022-03-30 DIAGNOSIS — J3081 Allergic rhinitis due to animal (cat) (dog) hair and dander: Secondary | ICD-10-CM | POA: Diagnosis not present

## 2022-03-30 DIAGNOSIS — J301 Allergic rhinitis due to pollen: Secondary | ICD-10-CM | POA: Diagnosis not present

## 2022-03-30 DIAGNOSIS — J3089 Other allergic rhinitis: Secondary | ICD-10-CM | POA: Diagnosis not present

## 2022-04-06 DIAGNOSIS — J301 Allergic rhinitis due to pollen: Secondary | ICD-10-CM | POA: Diagnosis not present

## 2022-04-06 DIAGNOSIS — J3089 Other allergic rhinitis: Secondary | ICD-10-CM | POA: Diagnosis not present

## 2022-04-06 DIAGNOSIS — J3081 Allergic rhinitis due to animal (cat) (dog) hair and dander: Secondary | ICD-10-CM | POA: Diagnosis not present

## 2022-04-15 ENCOUNTER — Other Ambulatory Visit: Payer: Self-pay | Admitting: Family Medicine

## 2022-04-20 ENCOUNTER — Other Ambulatory Visit: Payer: Self-pay | Admitting: Family Medicine

## 2022-04-20 DIAGNOSIS — J3081 Allergic rhinitis due to animal (cat) (dog) hair and dander: Secondary | ICD-10-CM | POA: Diagnosis not present

## 2022-04-20 DIAGNOSIS — J301 Allergic rhinitis due to pollen: Secondary | ICD-10-CM | POA: Diagnosis not present

## 2022-04-20 DIAGNOSIS — J3089 Other allergic rhinitis: Secondary | ICD-10-CM | POA: Diagnosis not present

## 2022-04-21 ENCOUNTER — Encounter: Payer: Self-pay | Admitting: Family Medicine

## 2022-04-22 ENCOUNTER — Encounter: Payer: Self-pay | Admitting: Family Medicine

## 2022-04-22 ENCOUNTER — Ambulatory Visit: Payer: BC Managed Care – PPO | Admitting: Family Medicine

## 2022-04-22 VITALS — BP 136/70 | HR 75 | Temp 97.8°F | Ht 70.0 in | Wt 181.7 lb

## 2022-04-22 DIAGNOSIS — E291 Testicular hypofunction: Secondary | ICD-10-CM

## 2022-04-22 DIAGNOSIS — Z23 Encounter for immunization: Secondary | ICD-10-CM | POA: Diagnosis not present

## 2022-04-22 NOTE — Progress Notes (Signed)
Established Patient Office Visit  Subjective   Patient ID: Kenneth Knapp, male    DOB: 1961/05/06  Age: 61 y.o. MRN: [id number]  Chief Complaint  Patient presents with   Medication Refill    HPI   Kenneth Knapp is here for medical follow-up.  He has history of low testosterone and is on replacement.  He was getting injections every 10 days and had recent level of 1187 approximately 10 days after his most recent injection.  We reduced his interval down to every 14 days.  He works in [employer] with [employer].  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  He is getting CBC and PSA monitor regularly.  He feels good generally with the testosterone replacement.  Does notice difference about the last day or 2 before his injection and then improves afterwards.  Less exhaustion.  Still exercising regularly.  Past Medical History:  Diagnosis Date   AR (allergic rhinitis)    BPH (benign prostatic hyperplasia)    ED (erectile dysfunction)    History of echocardiogram    04-15-2016  mild LVH, ef 60-65%   History of exercise stress test (03-22-2019 per pt had not any cardiac s&s since )   04-10-2016 (cardiology evaluation for precordial pain by dr t. Claiborne Billings)  Low risk w/ low specificity (mild positive ekg changes at very high workload and brisk return to normal)   Hyperlipidemia    Hypogonadism male    Rotator cuff tear, right    Wears glasses    Past Surgical History:  Procedure Laterality Date   ARTHOSCOPIC ROTAOR CUFF REPAIR Right 03/23/2019   Procedure: ARTHROSCOPIC ROTATOR CUFF REPAIR; SUBACROMIAL DECOMPRESSION; BICEP TENODISIS;  Surgeon: Tania Ade, MD;  Location: [place];  Service: Orthopedics;  Laterality: Right;  90 MIN   COLONOSCOPY  2013   LUMBAR DISC SURGERY  2002   L4 -- L5   MASS EXCISION Left 06/08/2017   Procedure: EXCISION CYST BUTTOCK;  Surgeon: Erroll Luna, MD;  Location: [place];  Service:  General;  Laterality: Left;   WISDOM TOOTH EXTRACTION      reports that he has never smoked. He has never used smokeless tobacco. He reports current alcohol use. He reports that he does not use drugs. family history includes ALS in his mother; Arthritis in his mother; Colon cancer in his paternal grandmother; Heart disease in his father and paternal grandfather; Stroke (age of onset: 52) in his father. Allergies  Allergen Reactions   No Known Allergies     Review of Systems  Constitutional:  Negative for malaise/fatigue.  Eyes:  Negative for blurred vision.  Respiratory:  Negative for shortness of breath.   Cardiovascular:  Negative for chest pain.  Neurological:  Negative for dizziness, weakness and headaches.      Objective:     BP 136/70 (BP Location: Left Arm, Patient Position: Sitting, Cuff Size: Normal)   Pulse 75   Temp 97.8 F (36.6 C) (Oral)   Wt 181 lb 11.2 oz (82.4 kg)   SpO2 97%   BMI 26.07 kg/m    Physical Exam Constitutional:      Appearance: He is well-developed.  Eyes:     Pupils: Pupils are equal, round, and reactive to light.  Neck:     Thyroid: No thyromegaly.  Cardiovascular:     Rate and Rhythm: Normal rate and regular rhythm.     Heart sounds: No murmur heard. Pulmonary:  Effort: Pulmonary effort is normal. No respiratory distress.     Breath sounds: Normal breath sounds. No wheezing or rales.  Musculoskeletal:     Cervical back: Neck supple.  Neurological:     Mental Status: He is alert and oriented to person, place, and time.      No results found for any visits on 04/22/22.    The 10-year ASCVD risk score (Arnett DK, et al., 2019) is: 8%* (Cholesterol units were assumed)    Assessment & Plan:   Low testosterone.  Patient on replacement.  Getting labs every 6 months.  Recent labs from August reviewed.  CBC normal with no polycythemia.  Will need PSA with next lab draw.  We recently reduced his dose of testosterone and he will  get follow-up levels soon on decreased interval of every 14 days.  Influenza vaccine given   No follow-ups on file.    Evelena Peat, MD

## 2022-04-27 ENCOUNTER — Other Ambulatory Visit: Payer: Self-pay | Admitting: Family Medicine

## 2022-04-27 DIAGNOSIS — L570 Actinic keratosis: Secondary | ICD-10-CM | POA: Diagnosis not present

## 2022-04-27 DIAGNOSIS — D225 Melanocytic nevi of trunk: Secondary | ICD-10-CM | POA: Diagnosis not present

## 2022-04-27 DIAGNOSIS — D2261 Melanocytic nevi of right upper limb, including shoulder: Secondary | ICD-10-CM | POA: Diagnosis not present

## 2022-04-27 DIAGNOSIS — D2271 Melanocytic nevi of right lower limb, including hip: Secondary | ICD-10-CM | POA: Diagnosis not present

## 2022-04-27 DIAGNOSIS — J301 Allergic rhinitis due to pollen: Secondary | ICD-10-CM | POA: Diagnosis not present

## 2022-04-27 DIAGNOSIS — J3081 Allergic rhinitis due to animal (cat) (dog) hair and dander: Secondary | ICD-10-CM | POA: Diagnosis not present

## 2022-04-27 DIAGNOSIS — J3089 Other allergic rhinitis: Secondary | ICD-10-CM | POA: Diagnosis not present

## 2022-04-27 DIAGNOSIS — D2272 Melanocytic nevi of left lower limb, including hip: Secondary | ICD-10-CM | POA: Diagnosis not present

## 2022-04-28 MED ORDER — TESTOSTERONE CYPIONATE 200 MG/ML IM SOLN: INTRAMUSCULAR | 0 refills | Status: DC

## 2022-04-28 NOTE — Telephone Encounter (Signed)
Last Rx given on 8/8 with no ref

## 2022-05-04 DIAGNOSIS — J301 Allergic rhinitis due to pollen: Secondary | ICD-10-CM | POA: Diagnosis not present

## 2022-05-04 DIAGNOSIS — J3089 Other allergic rhinitis: Secondary | ICD-10-CM | POA: Diagnosis not present

## 2022-05-04 DIAGNOSIS — J3081 Allergic rhinitis due to animal (cat) (dog) hair and dander: Secondary | ICD-10-CM | POA: Diagnosis not present

## 2022-05-11 ENCOUNTER — Other Ambulatory Visit: Payer: Self-pay | Admitting: Family Medicine

## 2022-05-11 NOTE — Telephone Encounter (Signed)
Last office visit 04/22/22 Last refill 01/27/22

## 2022-05-12 DIAGNOSIS — J3089 Other allergic rhinitis: Secondary | ICD-10-CM | POA: Diagnosis not present

## 2022-05-12 DIAGNOSIS — J301 Allergic rhinitis due to pollen: Secondary | ICD-10-CM | POA: Diagnosis not present

## 2022-05-12 DIAGNOSIS — J3081 Allergic rhinitis due to animal (cat) (dog) hair and dander: Secondary | ICD-10-CM | POA: Diagnosis not present

## 2022-05-18 DIAGNOSIS — J301 Allergic rhinitis due to pollen: Secondary | ICD-10-CM | POA: Diagnosis not present

## 2022-05-18 DIAGNOSIS — J3081 Allergic rhinitis due to animal (cat) (dog) hair and dander: Secondary | ICD-10-CM | POA: Diagnosis not present

## 2022-05-18 DIAGNOSIS — J3089 Other allergic rhinitis: Secondary | ICD-10-CM | POA: Diagnosis not present

## 2022-05-20 ENCOUNTER — Ambulatory Visit (INDEPENDENT_AMBULATORY_CARE_PROVIDER_SITE_OTHER): Payer: BC Managed Care – PPO

## 2022-05-20 DIAGNOSIS — M779 Enthesopathy, unspecified: Secondary | ICD-10-CM

## 2022-05-20 NOTE — Progress Notes (Signed)
Patient presents today to be casted for custom molded orthotics. Dr. Paulla Dolly has been treating patient for capsulitis.   Impression foam cast was taken. ABN signed.  Patient info-  Shoe size: 10 medium men's   Shoe style: Dress shoe  Height: 5'10"  Weight: 177 lbs   Insurance: BC[employer]   Patient will be notified once orthotics arrive in office and reappoint for fitting at that time.

## 2022-05-22 ENCOUNTER — Encounter: Payer: Self-pay | Admitting: Family Medicine

## 2022-05-22 ENCOUNTER — Ambulatory Visit: Payer: BC Managed Care – PPO | Admitting: Family Medicine

## 2022-05-22 VITALS — BP 118/78 | HR 83 | Temp 99.0°F | Ht 70.0 in | Wt 180.0 lb

## 2022-05-22 DIAGNOSIS — J0100 Acute maxillary sinusitis, unspecified: Secondary | ICD-10-CM | POA: Diagnosis not present

## 2022-05-22 MED ORDER — AMOXICILLIN-POT CLAVULANATE 875-125 MG PO TABS: 1.0000 | ORAL_TABLET | Freq: Two times a day (BID) | ORAL | 0 refills | Status: AC

## 2022-05-22 NOTE — Progress Notes (Signed)
   Established Patient Office Visit  Subjective   Patient ID: Kenneth Knapp, male    DOB: 1961-01-12  Age: 61 y.o. MRN: [id number]  Chief Complaint  Patient presents with   Sinusitis    Pt here for new symptoms. He started 6-7 days ago, states that his congestion is increasing, yellowish sputum, coughing a lot, positive for ear pain BL and sore throat, no difficulty breathing, no chest pain, just maybe some phlegm in the chest. 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 COVID test was done at home and is negative. NKDA.  Sinusitis Associated symptoms include chills.      Review of Systems  Constitutional:  Positive for chills and fever.      Objective:     BP 118/78   Pulse 83   Temp 99 F (37.2 C) (Oral)   Ht 5\' 10"  (1.778 m)   Wt 180 lb (81.6 kg)   SpO2 97%   BMI 25.83 kg/m    Physical Exam Vitals reviewed.  Constitutional:      Appearance: Normal appearance. He is well-groomed and normal weight.  HENT:     Right Ear: Tympanic membrane normal.     Left Ear: Tympanic membrane normal.     Nose:     Right Turbinates: Swollen.     Left Turbinates: Swollen.     Right Sinus: Maxillary sinus tenderness present.     Left Sinus: Maxillary sinus tenderness present.     Mouth/Throat:     Mouth: Mucous membranes are moist.     Pharynx: Oropharynx is clear. No oropharyngeal exudate or posterior oropharyngeal erythema.  Eyes:     Extraocular Movements: Extraocular movements intact.     Conjunctiva/sclera: Conjunctivae normal.  Neck:     Thyroid: No thyromegaly.  Cardiovascular:     Rate and Rhythm: Normal rate and regular rhythm.     Heart sounds: S1 normal and S2 normal. No murmur heard. Pulmonary:     Effort: Pulmonary effort is normal.     Breath sounds: Normal breath sounds and air entry. No rales.  Abdominal:     General: Abdomen is flat. Bowel sounds are normal.  Musculoskeletal:     Right lower leg: No edema.     Left  lower leg: No edema.  Neurological:     General: No focal deficit present.     Mental Status: He is alert and oriented to person, place, and time.     Gait: Gait is intact.  Psychiatric:        Mood and Affect: Mood and affect normal.      No results found for any visits on 05/22/22.    The 10-year ASCVD risk score (Arnett DK, et al., 2019) is: 6.3%* (Cholesterol units were assumed)    Assessment & Plan:   Problem List Items Addressed This Visit   None Visit Diagnoses     Acute non-recurrent maxillary sinusitis    -  Primary   Relevant Medications   amoxicillin-clavulanate (AUGMENTIN) 875-125 MG tablet  Patient has been ill for >7 days and he continues to have fevers intermittently even with tylenol use. Will treat with augmentin for 7 days. Pt may use OTC medications for symptom relief as well. Follow up as needed.     No follow-ups on file.    Farrel Conners, MD

## 2022-05-25 DIAGNOSIS — J301 Allergic rhinitis due to pollen: Secondary | ICD-10-CM | POA: Diagnosis not present

## 2022-05-25 DIAGNOSIS — J3089 Other allergic rhinitis: Secondary | ICD-10-CM | POA: Diagnosis not present

## 2022-05-25 DIAGNOSIS — J3081 Allergic rhinitis due to animal (cat) (dog) hair and dander: Secondary | ICD-10-CM | POA: Diagnosis not present

## 2022-06-08 DIAGNOSIS — J3089 Other allergic rhinitis: Secondary | ICD-10-CM | POA: Diagnosis not present

## 2022-06-08 DIAGNOSIS — J301 Allergic rhinitis due to pollen: Secondary | ICD-10-CM | POA: Diagnosis not present

## 2022-06-08 DIAGNOSIS — J3081 Allergic rhinitis due to animal (cat) (dog) hair and dander: Secondary | ICD-10-CM | POA: Diagnosis not present

## 2022-06-15 DIAGNOSIS — J301 Allergic rhinitis due to pollen: Secondary | ICD-10-CM | POA: Diagnosis not present

## 2022-06-15 DIAGNOSIS — J3081 Allergic rhinitis due to animal (cat) (dog) hair and dander: Secondary | ICD-10-CM | POA: Diagnosis not present

## 2022-06-15 DIAGNOSIS — J3089 Other allergic rhinitis: Secondary | ICD-10-CM | POA: Diagnosis not present

## 2022-06-22 DIAGNOSIS — J3081 Allergic rhinitis due to animal (cat) (dog) hair and dander: Secondary | ICD-10-CM | POA: Diagnosis not present

## 2022-06-22 DIAGNOSIS — J301 Allergic rhinitis due to pollen: Secondary | ICD-10-CM | POA: Diagnosis not present

## 2022-06-22 DIAGNOSIS — J3089 Other allergic rhinitis: Secondary | ICD-10-CM | POA: Diagnosis not present

## 2022-06-24 ENCOUNTER — Ambulatory Visit (INDEPENDENT_AMBULATORY_CARE_PROVIDER_SITE_OTHER): Payer: BC Managed Care – PPO | Admitting: Podiatry

## 2022-06-24 DIAGNOSIS — M779 Enthesopathy, unspecified: Secondary | ICD-10-CM

## 2022-06-24 NOTE — Progress Notes (Signed)
Patient presents today to pick up custom molded foot orthotics recommended by Dr. Charlsie Merles.   Orthotics were dispensed and fit was satisfactory. Reviewed instructions for break-in and wear. Written instructions given to patient.  Patient will follow up as needed.   [facility] - order # [id number]

## 2022-06-28 ENCOUNTER — Other Ambulatory Visit: Payer: Self-pay | Admitting: Family Medicine

## 2022-06-29 DIAGNOSIS — J3089 Other allergic rhinitis: Secondary | ICD-10-CM | POA: Diagnosis not present

## 2022-06-29 DIAGNOSIS — J301 Allergic rhinitis due to pollen: Secondary | ICD-10-CM | POA: Diagnosis not present

## 2022-06-29 DIAGNOSIS — J3081 Allergic rhinitis due to animal (cat) (dog) hair and dander: Secondary | ICD-10-CM | POA: Diagnosis not present

## 2022-07-07 DIAGNOSIS — J3089 Other allergic rhinitis: Secondary | ICD-10-CM | POA: Diagnosis not present

## 2022-07-07 DIAGNOSIS — J301 Allergic rhinitis due to pollen: Secondary | ICD-10-CM | POA: Diagnosis not present

## 2022-07-07 DIAGNOSIS — J3081 Allergic rhinitis due to animal (cat) (dog) hair and dander: Secondary | ICD-10-CM | POA: Diagnosis not present

## 2022-07-08 DIAGNOSIS — J3089 Other allergic rhinitis: Secondary | ICD-10-CM | POA: Diagnosis not present

## 2022-07-08 DIAGNOSIS — J3081 Allergic rhinitis due to animal (cat) (dog) hair and dander: Secondary | ICD-10-CM | POA: Diagnosis not present

## 2022-07-09 ENCOUNTER — Other Ambulatory Visit: Payer: BC Managed Care – PPO

## 2022-07-22 DIAGNOSIS — J3089 Other allergic rhinitis: Secondary | ICD-10-CM | POA: Diagnosis not present

## 2022-07-22 DIAGNOSIS — J301 Allergic rhinitis due to pollen: Secondary | ICD-10-CM | POA: Diagnosis not present

## 2022-07-22 DIAGNOSIS — J3081 Allergic rhinitis due to animal (cat) (dog) hair and dander: Secondary | ICD-10-CM | POA: Diagnosis not present

## 2022-07-28 DIAGNOSIS — J301 Allergic rhinitis due to pollen: Secondary | ICD-10-CM | POA: Diagnosis not present

## 2022-07-28 DIAGNOSIS — J3089 Other allergic rhinitis: Secondary | ICD-10-CM | POA: Diagnosis not present

## 2022-07-28 DIAGNOSIS — J3081 Allergic rhinitis due to animal (cat) (dog) hair and dander: Secondary | ICD-10-CM | POA: Diagnosis not present

## 2022-08-03 DIAGNOSIS — J301 Allergic rhinitis due to pollen: Secondary | ICD-10-CM | POA: Diagnosis not present

## 2022-08-03 DIAGNOSIS — J3089 Other allergic rhinitis: Secondary | ICD-10-CM | POA: Diagnosis not present

## 2022-08-03 DIAGNOSIS — J3081 Allergic rhinitis due to animal (cat) (dog) hair and dander: Secondary | ICD-10-CM | POA: Diagnosis not present

## 2022-08-07 DIAGNOSIS — S56212A Strain of other flexor muscle, fascia and tendon at forearm level, left arm, initial encounter: Secondary | ICD-10-CM | POA: Diagnosis not present

## 2022-08-07 DIAGNOSIS — M25522 Pain in left elbow: Secondary | ICD-10-CM | POA: Diagnosis not present

## 2022-08-07 DIAGNOSIS — M9907 Segmental and somatic dysfunction of upper extremity: Secondary | ICD-10-CM | POA: Diagnosis not present

## 2022-08-07 DIAGNOSIS — G8929 Other chronic pain: Secondary | ICD-10-CM | POA: Diagnosis not present

## 2022-08-07 DIAGNOSIS — M5451 Vertebrogenic low back pain: Secondary | ICD-10-CM | POA: Diagnosis not present

## 2022-08-10 DIAGNOSIS — J301 Allergic rhinitis due to pollen: Secondary | ICD-10-CM | POA: Diagnosis not present

## 2022-08-10 DIAGNOSIS — J3089 Other allergic rhinitis: Secondary | ICD-10-CM | POA: Diagnosis not present

## 2022-08-10 DIAGNOSIS — J3081 Allergic rhinitis due to animal (cat) (dog) hair and dander: Secondary | ICD-10-CM | POA: Diagnosis not present

## 2022-08-11 ENCOUNTER — Other Ambulatory Visit: Payer: Self-pay | Admitting: Family Medicine

## 2022-08-12 DIAGNOSIS — J301 Allergic rhinitis due to pollen: Secondary | ICD-10-CM | POA: Diagnosis not present

## 2022-08-12 DIAGNOSIS — J3089 Other allergic rhinitis: Secondary | ICD-10-CM | POA: Diagnosis not present

## 2022-08-12 DIAGNOSIS — J3081 Allergic rhinitis due to animal (cat) (dog) hair and dander: Secondary | ICD-10-CM | POA: Diagnosis not present

## 2022-08-17 DIAGNOSIS — J301 Allergic rhinitis due to pollen: Secondary | ICD-10-CM | POA: Diagnosis not present

## 2022-08-17 DIAGNOSIS — J3081 Allergic rhinitis due to animal (cat) (dog) hair and dander: Secondary | ICD-10-CM | POA: Diagnosis not present

## 2022-08-17 DIAGNOSIS — J3089 Other allergic rhinitis: Secondary | ICD-10-CM | POA: Diagnosis not present

## 2022-08-25 DIAGNOSIS — J301 Allergic rhinitis due to pollen: Secondary | ICD-10-CM | POA: Diagnosis not present

## 2022-08-25 DIAGNOSIS — J3081 Allergic rhinitis due to animal (cat) (dog) hair and dander: Secondary | ICD-10-CM | POA: Diagnosis not present

## 2022-08-25 DIAGNOSIS — J3089 Other allergic rhinitis: Secondary | ICD-10-CM | POA: Diagnosis not present

## 2022-08-31 DIAGNOSIS — J301 Allergic rhinitis due to pollen: Secondary | ICD-10-CM | POA: Diagnosis not present

## 2022-08-31 DIAGNOSIS — J3089 Other allergic rhinitis: Secondary | ICD-10-CM | POA: Diagnosis not present

## 2022-08-31 DIAGNOSIS — J3081 Allergic rhinitis due to animal (cat) (dog) hair and dander: Secondary | ICD-10-CM | POA: Diagnosis not present

## 2022-09-07 DIAGNOSIS — J3081 Allergic rhinitis due to animal (cat) (dog) hair and dander: Secondary | ICD-10-CM | POA: Diagnosis not present

## 2022-09-07 DIAGNOSIS — J3089 Other allergic rhinitis: Secondary | ICD-10-CM | POA: Diagnosis not present

## 2022-09-07 DIAGNOSIS — J301 Allergic rhinitis due to pollen: Secondary | ICD-10-CM | POA: Diagnosis not present

## 2022-09-14 DIAGNOSIS — J3089 Other allergic rhinitis: Secondary | ICD-10-CM | POA: Diagnosis not present

## 2022-09-14 DIAGNOSIS — J3081 Allergic rhinitis due to animal (cat) (dog) hair and dander: Secondary | ICD-10-CM | POA: Diagnosis not present

## 2022-09-14 DIAGNOSIS — J301 Allergic rhinitis due to pollen: Secondary | ICD-10-CM | POA: Diagnosis not present

## 2022-09-21 DIAGNOSIS — J3089 Other allergic rhinitis: Secondary | ICD-10-CM | POA: Diagnosis not present

## 2022-09-21 DIAGNOSIS — J301 Allergic rhinitis due to pollen: Secondary | ICD-10-CM | POA: Diagnosis not present

## 2022-09-21 DIAGNOSIS — J3081 Allergic rhinitis due to animal (cat) (dog) hair and dander: Secondary | ICD-10-CM | POA: Diagnosis not present

## 2022-09-25 ENCOUNTER — Telehealth: Payer: BC Managed Care – PPO | Admitting: Nurse Practitioner

## 2022-09-25 DIAGNOSIS — J019 Acute sinusitis, unspecified: Secondary | ICD-10-CM | POA: Diagnosis not present

## 2022-09-25 MED ORDER — AZITHROMYCIN 250 MG PO TABS: ORAL_TABLET | ORAL | 0 refills | Status: AC

## 2022-09-25 NOTE — Assessment & Plan Note (Signed)
Acute, patient reports symptoms are consistent with previous sinus infections that he has had in the past.  Did recommend considering waiting to take an antibiotic until least a 10 to day 14 of illness onset.  Patient has taken and tolerated azithromycin in the past.  Will send in prescription for azithromycin x 5 days.  Patient to call PCP or follow-up if symptoms persist or worsen.  Patient reports understanding.

## 2022-09-25 NOTE — Progress Notes (Signed)
   Established Patient Office Visit  An audio-only tele-health visit was completed today for this patient. I connected with  Kenneth Knapp on 09/25/22 utilizing audio-only technology and verified that I am speaking with the correct person using two identifiers. The patient was located at their place of employment, and I was located at the office of [facility] during the encounter. I discussed the limitations of evaluation and management by telemedicine. The patient expressed understanding and agreed to proceed.   **Visit started using video and audio technology, unfortunately there were issues with audio portion of the visit.  This ended up being switched to telephonic visit for this reason.  Subjective   Patient ID: Kenneth Knapp, male    DOB: September 15, 1960  Age: 62 y.o. MRN: [id number]  Chief Complaint  Patient presents with   Cough    Congestion and cough for about 4 days now  Expel mucus, thick yellow Having chiills tested negative for covid     Symptom onset 5 days ago.  Reports longstanding history of yearly sinus infection requiring antibiotics around this time of year.  Currently experiencing headache, congestion, thick yellow nasal discharge, weak cough, felt slightly feverish last night.  Denies wheezing shortness of breath.    ROS: see HPI    Objective:     There were no vitals taken for this visit.   Physical Exam Comprehensive physical exam not completed today as office visit was conducted remotely.  Video visit done initially, unfortunately there was technical issues with audio component of the video visit so this was switched to audio.  Patient appeared well on video initially, no signs of severe respiratory distress..  Patient was alert and oriented, and appeared to have appropriate judgment.   No results found for any visits on 09/25/22.    The 10-year ASCVD risk score (Arnett DK, et al., 2019) is: 6.3%* (Cholesterol units were  assumed)    Assessment & Plan:   Problem List Items Addressed This Visit       Respiratory   Acute non-recurrent sinusitis - Primary    Acute, patient reports symptoms are consistent with previous sinus infections that he has had in the past.  Did recommend considering waiting to take an antibiotic until least a 10 to day 14 of illness onset.  Patient has taken and tolerated azithromycin in the past.  Will send in prescription for azithromycin x 5 days.  Patient to call PCP or follow-up if symptoms persist or worsen.  Patient reports understanding.      Relevant Medications   azithromycin (ZITHROMAX) 250 MG tablet    Return if symptoms worsen or fail to improve.  Total time on telephone was 5 minutes and 31 seconds.   Ailene Ards, NP

## 2022-09-28 ENCOUNTER — Encounter: Payer: Self-pay | Admitting: Family Medicine

## 2022-09-28 DIAGNOSIS — J301 Allergic rhinitis due to pollen: Secondary | ICD-10-CM | POA: Diagnosis not present

## 2022-09-28 DIAGNOSIS — J3081 Allergic rhinitis due to animal (cat) (dog) hair and dander: Secondary | ICD-10-CM | POA: Diagnosis not present

## 2022-09-28 DIAGNOSIS — J3089 Other allergic rhinitis: Secondary | ICD-10-CM | POA: Diagnosis not present

## 2022-10-02 ENCOUNTER — Other Ambulatory Visit: Payer: Self-pay | Admitting: Family Medicine

## 2022-10-06 DIAGNOSIS — J301 Allergic rhinitis due to pollen: Secondary | ICD-10-CM | POA: Diagnosis not present

## 2022-10-06 DIAGNOSIS — J3081 Allergic rhinitis due to animal (cat) (dog) hair and dander: Secondary | ICD-10-CM | POA: Diagnosis not present

## 2022-10-06 DIAGNOSIS — J3089 Other allergic rhinitis: Secondary | ICD-10-CM | POA: Diagnosis not present

## 2022-10-17 ENCOUNTER — Other Ambulatory Visit: Payer: Self-pay | Admitting: Family Medicine

## 2022-10-20 DIAGNOSIS — J3081 Allergic rhinitis due to animal (cat) (dog) hair and dander: Secondary | ICD-10-CM | POA: Diagnosis not present

## 2022-10-20 DIAGNOSIS — J3089 Other allergic rhinitis: Secondary | ICD-10-CM | POA: Diagnosis not present

## 2022-10-20 DIAGNOSIS — J301 Allergic rhinitis due to pollen: Secondary | ICD-10-CM | POA: Diagnosis not present

## 2022-10-21 DIAGNOSIS — L820 Inflamed seborrheic keratosis: Secondary | ICD-10-CM | POA: Diagnosis not present

## 2022-10-26 DIAGNOSIS — J301 Allergic rhinitis due to pollen: Secondary | ICD-10-CM | POA: Diagnosis not present

## 2022-10-26 DIAGNOSIS — J3089 Other allergic rhinitis: Secondary | ICD-10-CM | POA: Diagnosis not present

## 2022-10-26 DIAGNOSIS — J3081 Allergic rhinitis due to animal (cat) (dog) hair and dander: Secondary | ICD-10-CM | POA: Diagnosis not present

## 2022-11-04 DIAGNOSIS — J301 Allergic rhinitis due to pollen: Secondary | ICD-10-CM | POA: Diagnosis not present

## 2022-11-04 DIAGNOSIS — J3081 Allergic rhinitis due to animal (cat) (dog) hair and dander: Secondary | ICD-10-CM | POA: Diagnosis not present

## 2022-11-04 DIAGNOSIS — J3089 Other allergic rhinitis: Secondary | ICD-10-CM | POA: Diagnosis not present

## 2022-11-09 DIAGNOSIS — J3081 Allergic rhinitis due to animal (cat) (dog) hair and dander: Secondary | ICD-10-CM | POA: Diagnosis not present

## 2022-11-09 DIAGNOSIS — J301 Allergic rhinitis due to pollen: Secondary | ICD-10-CM | POA: Diagnosis not present

## 2022-11-09 DIAGNOSIS — J3089 Other allergic rhinitis: Secondary | ICD-10-CM | POA: Diagnosis not present

## 2022-11-11 ENCOUNTER — Other Ambulatory Visit: Payer: Self-pay | Admitting: Family Medicine

## 2022-11-12 ENCOUNTER — Other Ambulatory Visit: Payer: Self-pay | Admitting: Family Medicine

## 2022-11-13 ENCOUNTER — Other Ambulatory Visit: Payer: Self-pay | Admitting: Family Medicine

## 2022-11-16 ENCOUNTER — Other Ambulatory Visit: Payer: Self-pay | Admitting: Family Medicine

## 2022-11-16 DIAGNOSIS — J301 Allergic rhinitis due to pollen: Secondary | ICD-10-CM | POA: Diagnosis not present

## 2022-11-16 DIAGNOSIS — J3081 Allergic rhinitis due to animal (cat) (dog) hair and dander: Secondary | ICD-10-CM | POA: Diagnosis not present

## 2022-11-16 DIAGNOSIS — J3089 Other allergic rhinitis: Secondary | ICD-10-CM | POA: Diagnosis not present

## 2022-11-30 DIAGNOSIS — J3081 Allergic rhinitis due to animal (cat) (dog) hair and dander: Secondary | ICD-10-CM | POA: Diagnosis not present

## 2022-11-30 DIAGNOSIS — J3089 Other allergic rhinitis: Secondary | ICD-10-CM | POA: Diagnosis not present

## 2022-11-30 DIAGNOSIS — J301 Allergic rhinitis due to pollen: Secondary | ICD-10-CM | POA: Diagnosis not present

## 2022-12-07 DIAGNOSIS — J3089 Other allergic rhinitis: Secondary | ICD-10-CM | POA: Diagnosis not present

## 2022-12-07 DIAGNOSIS — J3081 Allergic rhinitis due to animal (cat) (dog) hair and dander: Secondary | ICD-10-CM | POA: Diagnosis not present

## 2022-12-07 DIAGNOSIS — J301 Allergic rhinitis due to pollen: Secondary | ICD-10-CM | POA: Diagnosis not present

## 2022-12-15 DIAGNOSIS — J301 Allergic rhinitis due to pollen: Secondary | ICD-10-CM | POA: Diagnosis not present

## 2022-12-15 DIAGNOSIS — J3089 Other allergic rhinitis: Secondary | ICD-10-CM | POA: Diagnosis not present

## 2022-12-15 DIAGNOSIS — J3081 Allergic rhinitis due to animal (cat) (dog) hair and dander: Secondary | ICD-10-CM | POA: Diagnosis not present

## 2022-12-16 DIAGNOSIS — J301 Allergic rhinitis due to pollen: Secondary | ICD-10-CM | POA: Diagnosis not present

## 2022-12-17 DIAGNOSIS — J3081 Allergic rhinitis due to animal (cat) (dog) hair and dander: Secondary | ICD-10-CM | POA: Diagnosis not present

## 2022-12-17 DIAGNOSIS — J3089 Other allergic rhinitis: Secondary | ICD-10-CM | POA: Diagnosis not present

## 2022-12-21 DIAGNOSIS — J301 Allergic rhinitis due to pollen: Secondary | ICD-10-CM | POA: Diagnosis not present

## 2022-12-21 DIAGNOSIS — J3089 Other allergic rhinitis: Secondary | ICD-10-CM | POA: Diagnosis not present

## 2022-12-21 DIAGNOSIS — J3081 Allergic rhinitis due to animal (cat) (dog) hair and dander: Secondary | ICD-10-CM | POA: Diagnosis not present

## 2022-12-26 ENCOUNTER — Other Ambulatory Visit: Payer: Self-pay | Admitting: Family Medicine

## 2022-12-27 ENCOUNTER — Other Ambulatory Visit: Payer: Self-pay | Admitting: Internal Medicine

## 2022-12-28 ENCOUNTER — Other Ambulatory Visit: Payer: Self-pay | Admitting: Family Medicine

## 2022-12-29 DIAGNOSIS — J3089 Other allergic rhinitis: Secondary | ICD-10-CM | POA: Diagnosis not present

## 2022-12-29 DIAGNOSIS — J3081 Allergic rhinitis due to animal (cat) (dog) hair and dander: Secondary | ICD-10-CM | POA: Diagnosis not present

## 2022-12-29 DIAGNOSIS — J301 Allergic rhinitis due to pollen: Secondary | ICD-10-CM | POA: Diagnosis not present

## 2023-01-04 ENCOUNTER — Other Ambulatory Visit: Payer: Self-pay | Admitting: Family Medicine

## 2023-01-04 DIAGNOSIS — J3089 Other allergic rhinitis: Secondary | ICD-10-CM | POA: Diagnosis not present

## 2023-01-04 DIAGNOSIS — J3081 Allergic rhinitis due to animal (cat) (dog) hair and dander: Secondary | ICD-10-CM | POA: Diagnosis not present

## 2023-01-04 DIAGNOSIS — J301 Allergic rhinitis due to pollen: Secondary | ICD-10-CM | POA: Diagnosis not present

## 2023-01-11 DIAGNOSIS — J301 Allergic rhinitis due to pollen: Secondary | ICD-10-CM | POA: Diagnosis not present

## 2023-01-11 DIAGNOSIS — J3081 Allergic rhinitis due to animal (cat) (dog) hair and dander: Secondary | ICD-10-CM | POA: Diagnosis not present

## 2023-01-11 DIAGNOSIS — J3089 Other allergic rhinitis: Secondary | ICD-10-CM | POA: Diagnosis not present

## 2023-01-12 ENCOUNTER — Encounter: Payer: Self-pay | Admitting: Family Medicine

## 2023-01-19 DIAGNOSIS — J3089 Other allergic rhinitis: Secondary | ICD-10-CM | POA: Diagnosis not present

## 2023-01-19 DIAGNOSIS — J3081 Allergic rhinitis due to animal (cat) (dog) hair and dander: Secondary | ICD-10-CM | POA: Diagnosis not present

## 2023-01-19 DIAGNOSIS — J301 Allergic rhinitis due to pollen: Secondary | ICD-10-CM | POA: Diagnosis not present

## 2023-02-01 DIAGNOSIS — J3089 Other allergic rhinitis: Secondary | ICD-10-CM | POA: Diagnosis not present

## 2023-02-01 DIAGNOSIS — J301 Allergic rhinitis due to pollen: Secondary | ICD-10-CM | POA: Diagnosis not present

## 2023-02-01 DIAGNOSIS — J3081 Allergic rhinitis due to animal (cat) (dog) hair and dander: Secondary | ICD-10-CM | POA: Diagnosis not present

## 2023-02-07 ENCOUNTER — Other Ambulatory Visit: Payer: Self-pay | Admitting: Family Medicine

## 2023-02-08 DIAGNOSIS — J301 Allergic rhinitis due to pollen: Secondary | ICD-10-CM | POA: Diagnosis not present

## 2023-02-08 DIAGNOSIS — J3089 Other allergic rhinitis: Secondary | ICD-10-CM | POA: Diagnosis not present

## 2023-02-08 DIAGNOSIS — J3081 Allergic rhinitis due to animal (cat) (dog) hair and dander: Secondary | ICD-10-CM | POA: Diagnosis not present

## 2023-02-15 DIAGNOSIS — J3089 Other allergic rhinitis: Secondary | ICD-10-CM | POA: Diagnosis not present

## 2023-02-15 DIAGNOSIS — J301 Allergic rhinitis due to pollen: Secondary | ICD-10-CM | POA: Diagnosis not present

## 2023-02-15 DIAGNOSIS — J3081 Allergic rhinitis due to animal (cat) (dog) hair and dander: Secondary | ICD-10-CM | POA: Diagnosis not present

## 2023-02-17 DIAGNOSIS — M75122 Complete rotator cuff tear or rupture of left shoulder, not specified as traumatic: Secondary | ICD-10-CM | POA: Diagnosis not present

## 2023-02-17 DIAGNOSIS — M25522 Pain in left elbow: Secondary | ICD-10-CM | POA: Diagnosis not present

## 2023-02-17 DIAGNOSIS — M25512 Pain in left shoulder: Secondary | ICD-10-CM | POA: Diagnosis not present

## 2023-02-23 DIAGNOSIS — J301 Allergic rhinitis due to pollen: Secondary | ICD-10-CM | POA: Diagnosis not present

## 2023-02-23 DIAGNOSIS — J3081 Allergic rhinitis due to animal (cat) (dog) hair and dander: Secondary | ICD-10-CM | POA: Diagnosis not present

## 2023-02-23 DIAGNOSIS — J3089 Other allergic rhinitis: Secondary | ICD-10-CM | POA: Diagnosis not present

## 2023-03-01 DIAGNOSIS — J3081 Allergic rhinitis due to animal (cat) (dog) hair and dander: Secondary | ICD-10-CM | POA: Diagnosis not present

## 2023-03-01 DIAGNOSIS — J3089 Other allergic rhinitis: Secondary | ICD-10-CM | POA: Diagnosis not present

## 2023-03-01 DIAGNOSIS — J301 Allergic rhinitis due to pollen: Secondary | ICD-10-CM | POA: Diagnosis not present

## 2023-03-03 ENCOUNTER — Other Ambulatory Visit: Payer: Self-pay | Admitting: Internal Medicine

## 2023-03-05 ENCOUNTER — Other Ambulatory Visit: Payer: Self-pay | Admitting: Family Medicine

## 2023-03-06 MED ORDER — TESTOSTERONE CYPIONATE 200 MG/ML IM SOLN: INTRAMUSCULAR | 0 refills | Status: DC

## 2023-03-08 DIAGNOSIS — J3081 Allergic rhinitis due to animal (cat) (dog) hair and dander: Secondary | ICD-10-CM | POA: Diagnosis not present

## 2023-03-08 DIAGNOSIS — J301 Allergic rhinitis due to pollen: Secondary | ICD-10-CM | POA: Diagnosis not present

## 2023-03-08 DIAGNOSIS — J3089 Other allergic rhinitis: Secondary | ICD-10-CM | POA: Diagnosis not present

## 2023-03-15 DIAGNOSIS — J3089 Other allergic rhinitis: Secondary | ICD-10-CM | POA: Diagnosis not present

## 2023-03-15 DIAGNOSIS — J301 Allergic rhinitis due to pollen: Secondary | ICD-10-CM | POA: Diagnosis not present

## 2023-03-15 DIAGNOSIS — J3081 Allergic rhinitis due to animal (cat) (dog) hair and dander: Secondary | ICD-10-CM | POA: Diagnosis not present

## 2023-03-18 ENCOUNTER — Ambulatory Visit
Admission: RE | Admit: 2023-03-18 | Discharge: 2023-03-18 | Disposition: A | Discharge status: Home / Self Care | Payer: BC Managed Care – PPO | Source: Ambulatory Visit | Attending: Sports Medicine | Admitting: Sports Medicine

## 2023-03-18 ENCOUNTER — Other Ambulatory Visit: Payer: Self-pay | Admitting: Sports Medicine

## 2023-03-18 DIAGNOSIS — M75102 Unspecified rotator cuff tear or rupture of left shoulder, not specified as traumatic: Secondary | ICD-10-CM

## 2023-03-18 DIAGNOSIS — G4700 Insomnia, unspecified: Secondary | ICD-10-CM | POA: Diagnosis not present

## 2023-03-18 DIAGNOSIS — G8929 Other chronic pain: Secondary | ICD-10-CM

## 2023-03-18 DIAGNOSIS — M19012 Primary osteoarthritis, left shoulder: Secondary | ICD-10-CM | POA: Diagnosis not present

## 2023-03-18 DIAGNOSIS — M25512 Pain in left shoulder: Secondary | ICD-10-CM | POA: Diagnosis not present

## 2023-03-23 ENCOUNTER — Other Ambulatory Visit: Payer: Self-pay | Admitting: Sports Medicine

## 2023-03-23 DIAGNOSIS — J3081 Allergic rhinitis due to animal (cat) (dog) hair and dander: Secondary | ICD-10-CM | POA: Diagnosis not present

## 2023-03-23 DIAGNOSIS — J301 Allergic rhinitis due to pollen: Secondary | ICD-10-CM | POA: Diagnosis not present

## 2023-03-23 DIAGNOSIS — J3089 Other allergic rhinitis: Secondary | ICD-10-CM | POA: Diagnosis not present

## 2023-03-23 DIAGNOSIS — M75102 Unspecified rotator cuff tear or rupture of left shoulder, not specified as traumatic: Secondary | ICD-10-CM

## 2023-03-23 DIAGNOSIS — M25512 Pain in left shoulder: Secondary | ICD-10-CM

## 2023-03-24 ENCOUNTER — Other Ambulatory Visit: Payer: Self-pay | Admitting: Family Medicine

## 2023-03-25 ENCOUNTER — Other Ambulatory Visit: Payer: Self-pay | Admitting: Internal Medicine

## 2023-03-28 ENCOUNTER — Ambulatory Visit
Admission: RE | Admit: 2023-03-28 | Discharge: 2023-03-28 | Disposition: A | Discharge status: Home / Self Care | Payer: BC Managed Care – PPO | Source: Ambulatory Visit | Attending: Sports Medicine | Admitting: Sports Medicine

## 2023-03-28 DIAGNOSIS — M75112 Incomplete rotator cuff tear or rupture of left shoulder, not specified as traumatic: Secondary | ICD-10-CM | POA: Diagnosis not present

## 2023-03-28 DIAGNOSIS — M19012 Primary osteoarthritis, left shoulder: Secondary | ICD-10-CM | POA: Diagnosis not present

## 2023-03-28 DIAGNOSIS — M25512 Pain in left shoulder: Secondary | ICD-10-CM

## 2023-03-28 DIAGNOSIS — M75102 Unspecified rotator cuff tear or rupture of left shoulder, not specified as traumatic: Secondary | ICD-10-CM

## 2023-03-29 DIAGNOSIS — J3081 Allergic rhinitis due to animal (cat) (dog) hair and dander: Secondary | ICD-10-CM | POA: Diagnosis not present

## 2023-03-29 DIAGNOSIS — J301 Allergic rhinitis due to pollen: Secondary | ICD-10-CM | POA: Diagnosis not present

## 2023-03-29 DIAGNOSIS — J3089 Other allergic rhinitis: Secondary | ICD-10-CM | POA: Diagnosis not present

## 2023-04-05 DIAGNOSIS — J301 Allergic rhinitis due to pollen: Secondary | ICD-10-CM | POA: Diagnosis not present

## 2023-04-05 DIAGNOSIS — M75122 Complete rotator cuff tear or rupture of left shoulder, not specified as traumatic: Secondary | ICD-10-CM | POA: Diagnosis not present

## 2023-04-05 DIAGNOSIS — J3081 Allergic rhinitis due to animal (cat) (dog) hair and dander: Secondary | ICD-10-CM | POA: Diagnosis not present

## 2023-04-05 DIAGNOSIS — J3089 Other allergic rhinitis: Secondary | ICD-10-CM | POA: Diagnosis not present

## 2023-04-19 DIAGNOSIS — J3089 Other allergic rhinitis: Secondary | ICD-10-CM | POA: Diagnosis not present

## 2023-04-19 DIAGNOSIS — J301 Allergic rhinitis due to pollen: Secondary | ICD-10-CM | POA: Diagnosis not present

## 2023-04-19 DIAGNOSIS — J3081 Allergic rhinitis due to animal (cat) (dog) hair and dander: Secondary | ICD-10-CM | POA: Diagnosis not present

## 2023-04-20 HISTORY — PX: ROTATOR CUFF REPAIR: SHX139

## 2023-04-26 DIAGNOSIS — J3089 Other allergic rhinitis: Secondary | ICD-10-CM | POA: Diagnosis not present

## 2023-04-26 DIAGNOSIS — J3081 Allergic rhinitis due to animal (cat) (dog) hair and dander: Secondary | ICD-10-CM | POA: Diagnosis not present

## 2023-04-26 DIAGNOSIS — Z23 Encounter for immunization: Secondary | ICD-10-CM | POA: Diagnosis not present

## 2023-04-26 DIAGNOSIS — J301 Allergic rhinitis due to pollen: Secondary | ICD-10-CM | POA: Diagnosis not present

## 2023-04-29 ENCOUNTER — Encounter: Payer: Self-pay | Admitting: Family Medicine

## 2023-04-30 MED ORDER — TRAZODONE HCL 100 MG PO TABS: 100.0000 mg | ORAL_TABLET | Freq: Every day | ORAL | 1 refills | Status: DC

## 2023-05-03 DIAGNOSIS — J301 Allergic rhinitis due to pollen: Secondary | ICD-10-CM | POA: Diagnosis not present

## 2023-05-03 DIAGNOSIS — J3081 Allergic rhinitis due to animal (cat) (dog) hair and dander: Secondary | ICD-10-CM | POA: Diagnosis not present

## 2023-05-03 DIAGNOSIS — J3089 Other allergic rhinitis: Secondary | ICD-10-CM | POA: Diagnosis not present

## 2023-05-06 DIAGNOSIS — M25812 Other specified joint disorders, left shoulder: Secondary | ICD-10-CM | POA: Diagnosis not present

## 2023-05-06 DIAGNOSIS — M24112 Other articular cartilage disorders, left shoulder: Secondary | ICD-10-CM | POA: Diagnosis not present

## 2023-05-06 DIAGNOSIS — M7582 Other shoulder lesions, left shoulder: Secondary | ICD-10-CM | POA: Diagnosis not present

## 2023-05-06 DIAGNOSIS — M7542 Impingement syndrome of left shoulder: Secondary | ICD-10-CM | POA: Diagnosis not present

## 2023-05-06 DIAGNOSIS — S43492A Other sprain of left shoulder joint, initial encounter: Secondary | ICD-10-CM | POA: Diagnosis not present

## 2023-05-06 DIAGNOSIS — G8918 Other acute postprocedural pain: Secondary | ICD-10-CM | POA: Diagnosis not present

## 2023-05-06 DIAGNOSIS — M75112 Incomplete rotator cuff tear or rupture of left shoulder, not specified as traumatic: Secondary | ICD-10-CM | POA: Diagnosis not present

## 2023-05-12 DIAGNOSIS — J301 Allergic rhinitis due to pollen: Secondary | ICD-10-CM | POA: Diagnosis not present

## 2023-05-12 DIAGNOSIS — J3089 Other allergic rhinitis: Secondary | ICD-10-CM | POA: Diagnosis not present

## 2023-05-12 DIAGNOSIS — J3081 Allergic rhinitis due to animal (cat) (dog) hair and dander: Secondary | ICD-10-CM | POA: Diagnosis not present

## 2023-05-14 DIAGNOSIS — R531 Weakness: Secondary | ICD-10-CM | POA: Diagnosis not present

## 2023-05-14 DIAGNOSIS — M25612 Stiffness of left shoulder, not elsewhere classified: Secondary | ICD-10-CM | POA: Diagnosis not present

## 2023-05-14 DIAGNOSIS — M25512 Pain in left shoulder: Secondary | ICD-10-CM | POA: Diagnosis not present

## 2023-05-17 DIAGNOSIS — J3089 Other allergic rhinitis: Secondary | ICD-10-CM | POA: Diagnosis not present

## 2023-05-17 DIAGNOSIS — J3081 Allergic rhinitis due to animal (cat) (dog) hair and dander: Secondary | ICD-10-CM | POA: Diagnosis not present

## 2023-05-17 DIAGNOSIS — J301 Allergic rhinitis due to pollen: Secondary | ICD-10-CM | POA: Diagnosis not present

## 2023-05-18 DIAGNOSIS — R531 Weakness: Secondary | ICD-10-CM | POA: Diagnosis not present

## 2023-05-18 DIAGNOSIS — M25612 Stiffness of left shoulder, not elsewhere classified: Secondary | ICD-10-CM | POA: Diagnosis not present

## 2023-05-18 DIAGNOSIS — M25512 Pain in left shoulder: Secondary | ICD-10-CM | POA: Diagnosis not present

## 2023-05-20 DIAGNOSIS — M25612 Stiffness of left shoulder, not elsewhere classified: Secondary | ICD-10-CM | POA: Diagnosis not present

## 2023-05-20 DIAGNOSIS — M25512 Pain in left shoulder: Secondary | ICD-10-CM | POA: Diagnosis not present

## 2023-05-20 DIAGNOSIS — R531 Weakness: Secondary | ICD-10-CM | POA: Diagnosis not present

## 2023-05-24 DIAGNOSIS — J301 Allergic rhinitis due to pollen: Secondary | ICD-10-CM | POA: Diagnosis not present

## 2023-05-24 DIAGNOSIS — J3089 Other allergic rhinitis: Secondary | ICD-10-CM | POA: Diagnosis not present

## 2023-05-24 DIAGNOSIS — J3081 Allergic rhinitis due to animal (cat) (dog) hair and dander: Secondary | ICD-10-CM | POA: Diagnosis not present

## 2023-05-25 DIAGNOSIS — M25612 Stiffness of left shoulder, not elsewhere classified: Secondary | ICD-10-CM | POA: Diagnosis not present

## 2023-05-25 DIAGNOSIS — R531 Weakness: Secondary | ICD-10-CM | POA: Diagnosis not present

## 2023-05-25 DIAGNOSIS — M25512 Pain in left shoulder: Secondary | ICD-10-CM | POA: Diagnosis not present

## 2023-05-27 DIAGNOSIS — R531 Weakness: Secondary | ICD-10-CM | POA: Diagnosis not present

## 2023-05-27 DIAGNOSIS — M25612 Stiffness of left shoulder, not elsewhere classified: Secondary | ICD-10-CM | POA: Diagnosis not present

## 2023-05-27 DIAGNOSIS — M25512 Pain in left shoulder: Secondary | ICD-10-CM | POA: Diagnosis not present

## 2023-05-31 DIAGNOSIS — J3089 Other allergic rhinitis: Secondary | ICD-10-CM | POA: Diagnosis not present

## 2023-05-31 DIAGNOSIS — J3081 Allergic rhinitis due to animal (cat) (dog) hair and dander: Secondary | ICD-10-CM | POA: Diagnosis not present

## 2023-05-31 DIAGNOSIS — J301 Allergic rhinitis due to pollen: Secondary | ICD-10-CM | POA: Diagnosis not present

## 2023-06-01 DIAGNOSIS — M25612 Stiffness of left shoulder, not elsewhere classified: Secondary | ICD-10-CM | POA: Diagnosis not present

## 2023-06-01 DIAGNOSIS — R531 Weakness: Secondary | ICD-10-CM | POA: Diagnosis not present

## 2023-06-01 DIAGNOSIS — M25512 Pain in left shoulder: Secondary | ICD-10-CM | POA: Diagnosis not present

## 2023-06-04 DIAGNOSIS — M25612 Stiffness of left shoulder, not elsewhere classified: Secondary | ICD-10-CM | POA: Diagnosis not present

## 2023-06-04 DIAGNOSIS — R531 Weakness: Secondary | ICD-10-CM | POA: Diagnosis not present

## 2023-06-04 DIAGNOSIS — M25512 Pain in left shoulder: Secondary | ICD-10-CM | POA: Diagnosis not present

## 2023-06-07 DIAGNOSIS — J3089 Other allergic rhinitis: Secondary | ICD-10-CM | POA: Diagnosis not present

## 2023-06-07 DIAGNOSIS — J3081 Allergic rhinitis due to animal (cat) (dog) hair and dander: Secondary | ICD-10-CM | POA: Diagnosis not present

## 2023-06-07 DIAGNOSIS — J301 Allergic rhinitis due to pollen: Secondary | ICD-10-CM | POA: Diagnosis not present

## 2023-06-08 DIAGNOSIS — M25512 Pain in left shoulder: Secondary | ICD-10-CM | POA: Diagnosis not present

## 2023-06-08 DIAGNOSIS — R531 Weakness: Secondary | ICD-10-CM | POA: Diagnosis not present

## 2023-06-08 DIAGNOSIS — M25612 Stiffness of left shoulder, not elsewhere classified: Secondary | ICD-10-CM | POA: Diagnosis not present

## 2023-06-11 DIAGNOSIS — M25512 Pain in left shoulder: Secondary | ICD-10-CM | POA: Diagnosis not present

## 2023-06-11 DIAGNOSIS — M25612 Stiffness of left shoulder, not elsewhere classified: Secondary | ICD-10-CM | POA: Diagnosis not present

## 2023-06-11 DIAGNOSIS — R531 Weakness: Secondary | ICD-10-CM | POA: Diagnosis not present

## 2023-06-14 DIAGNOSIS — M25512 Pain in left shoulder: Secondary | ICD-10-CM | POA: Diagnosis not present

## 2023-06-14 DIAGNOSIS — R531 Weakness: Secondary | ICD-10-CM | POA: Diagnosis not present

## 2023-06-14 DIAGNOSIS — M25612 Stiffness of left shoulder, not elsewhere classified: Secondary | ICD-10-CM | POA: Diagnosis not present

## 2023-06-21 DIAGNOSIS — J3081 Allergic rhinitis due to animal (cat) (dog) hair and dander: Secondary | ICD-10-CM | POA: Diagnosis not present

## 2023-06-21 DIAGNOSIS — J3089 Other allergic rhinitis: Secondary | ICD-10-CM | POA: Diagnosis not present

## 2023-06-21 DIAGNOSIS — J301 Allergic rhinitis due to pollen: Secondary | ICD-10-CM | POA: Diagnosis not present

## 2023-06-22 DIAGNOSIS — M25612 Stiffness of left shoulder, not elsewhere classified: Secondary | ICD-10-CM | POA: Diagnosis not present

## 2023-06-22 DIAGNOSIS — R531 Weakness: Secondary | ICD-10-CM | POA: Diagnosis not present

## 2023-06-22 DIAGNOSIS — M25512 Pain in left shoulder: Secondary | ICD-10-CM | POA: Diagnosis not present

## 2023-06-29 DIAGNOSIS — R531 Weakness: Secondary | ICD-10-CM | POA: Diagnosis not present

## 2023-06-29 DIAGNOSIS — M25512 Pain in left shoulder: Secondary | ICD-10-CM | POA: Diagnosis not present

## 2023-06-29 DIAGNOSIS — M25612 Stiffness of left shoulder, not elsewhere classified: Secondary | ICD-10-CM | POA: Diagnosis not present

## 2023-06-30 DIAGNOSIS — J3081 Allergic rhinitis due to animal (cat) (dog) hair and dander: Secondary | ICD-10-CM | POA: Diagnosis not present

## 2023-06-30 DIAGNOSIS — J301 Allergic rhinitis due to pollen: Secondary | ICD-10-CM | POA: Diagnosis not present

## 2023-06-30 DIAGNOSIS — J3089 Other allergic rhinitis: Secondary | ICD-10-CM | POA: Diagnosis not present

## 2023-07-01 DIAGNOSIS — R531 Weakness: Secondary | ICD-10-CM | POA: Diagnosis not present

## 2023-07-01 DIAGNOSIS — M25612 Stiffness of left shoulder, not elsewhere classified: Secondary | ICD-10-CM | POA: Diagnosis not present

## 2023-07-01 DIAGNOSIS — M25512 Pain in left shoulder: Secondary | ICD-10-CM | POA: Diagnosis not present

## 2023-07-05 DIAGNOSIS — J3081 Allergic rhinitis due to animal (cat) (dog) hair and dander: Secondary | ICD-10-CM | POA: Diagnosis not present

## 2023-07-05 DIAGNOSIS — J3089 Other allergic rhinitis: Secondary | ICD-10-CM | POA: Diagnosis not present

## 2023-07-05 DIAGNOSIS — J301 Allergic rhinitis due to pollen: Secondary | ICD-10-CM | POA: Diagnosis not present

## 2023-07-06 DIAGNOSIS — J301 Allergic rhinitis due to pollen: Secondary | ICD-10-CM | POA: Diagnosis not present

## 2023-07-06 DIAGNOSIS — J3089 Other allergic rhinitis: Secondary | ICD-10-CM | POA: Diagnosis not present

## 2023-07-06 DIAGNOSIS — J3081 Allergic rhinitis due to animal (cat) (dog) hair and dander: Secondary | ICD-10-CM | POA: Diagnosis not present

## 2023-07-07 ENCOUNTER — Other Ambulatory Visit: Payer: Self-pay | Admitting: Family Medicine

## 2023-07-09 DIAGNOSIS — M25612 Stiffness of left shoulder, not elsewhere classified: Secondary | ICD-10-CM | POA: Diagnosis not present

## 2023-07-09 DIAGNOSIS — M25512 Pain in left shoulder: Secondary | ICD-10-CM | POA: Diagnosis not present

## 2023-07-09 DIAGNOSIS — R531 Weakness: Secondary | ICD-10-CM | POA: Diagnosis not present

## 2023-07-10 DIAGNOSIS — F639 Impulse disorder, unspecified: Secondary | ICD-10-CM | POA: Diagnosis not present

## 2023-07-12 DIAGNOSIS — J3089 Other allergic rhinitis: Secondary | ICD-10-CM | POA: Diagnosis not present

## 2023-07-12 DIAGNOSIS — J3081 Allergic rhinitis due to animal (cat) (dog) hair and dander: Secondary | ICD-10-CM | POA: Diagnosis not present

## 2023-07-12 DIAGNOSIS — R531 Weakness: Secondary | ICD-10-CM | POA: Diagnosis not present

## 2023-07-12 DIAGNOSIS — M25512 Pain in left shoulder: Secondary | ICD-10-CM | POA: Diagnosis not present

## 2023-07-12 DIAGNOSIS — J301 Allergic rhinitis due to pollen: Secondary | ICD-10-CM | POA: Diagnosis not present

## 2023-07-12 DIAGNOSIS — M25612 Stiffness of left shoulder, not elsewhere classified: Secondary | ICD-10-CM | POA: Diagnosis not present

## 2023-07-26 ENCOUNTER — Other Ambulatory Visit: Payer: Self-pay | Admitting: Family Medicine

## 2023-07-27 NOTE — Telephone Encounter (Signed)
 Refill testosterone but we do need to verify these had recent labs including CBC and PSA.  I know that he has gotten these previously through work.  If not, schedule total testosterone, CBC, PSA  Kristian Covey MD [facility]

## 2023-08-02 ENCOUNTER — Encounter: Payer: Self-pay | Admitting: Family Medicine

## 2023-08-04 NOTE — Telephone Encounter (Signed)
 Note has been added to med list indicating dosage change

## 2023-08-26 ENCOUNTER — Other Ambulatory Visit: Payer: Self-pay | Admitting: Family Medicine

## 2023-08-31 ENCOUNTER — Other Ambulatory Visit: Payer: Self-pay | Admitting: Family Medicine

## 2023-09-02 ENCOUNTER — Encounter: Payer: Self-pay | Admitting: Family Medicine

## 2023-09-03 ENCOUNTER — Ambulatory Visit: Payer: BC Managed Care – PPO | Admitting: Family Medicine

## 2023-09-03 ENCOUNTER — Encounter: Payer: Self-pay | Admitting: Family Medicine

## 2023-09-03 VITALS — BP 124/78 | HR 67 | Temp 98.1°F | Wt 171.9 lb

## 2023-09-03 DIAGNOSIS — R7989 Other specified abnormal findings of blood chemistry: Secondary | ICD-10-CM

## 2023-09-03 DIAGNOSIS — F5104 Psychophysiologic insomnia: Secondary | ICD-10-CM

## 2023-09-03 DIAGNOSIS — M5432 Sciatica, left side: Secondary | ICD-10-CM

## 2023-09-03 NOTE — Progress Notes (Signed)
Established Patient Office Visit  Subjective   Patient ID: Kenneth Knapp, male    DOB: 21-Feb-1961  Age: 64 y.o. MRN: [id number]  Chief Complaint  Patient presents with   Medical Management of Chronic Issues    HPI   Kenneth Knapp is seen for medical follow-up.  He works in Medical sales representative and has labs done there about every 6 months.  He just send a copy over of the labs that were done which included PSA, testosterone level, chemistries.  CBC was stable.  PSA 1.5.  Testosterone level 971.  He is currently using 200 mg testosterone every 10 days.  He felt this was working better than 300 mg every 14 days  He has unfortunately had some progressive and recurrent back difficulties with some left-sided sciatica symptoms.  Just saw sports medicine.  MRI back pending.  Just prescribed gabapentin 300 mg 3 times daily.  Also given prescription for tramadol.  Chronic insomnia.  Has been on trazodone which helps.  Has had occasionally bump dosage up as high as 150 mg.  Infrequently takes Ambien.  He is aware of sleep hygiene factors.  Past Medical History:  Diagnosis Date   AR (allergic rhinitis)    BPH (benign prostatic hyperplasia)    ED (erectile dysfunction)    History of echocardiogram    04-15-2016  mild LVH, ef 60-65%   History of exercise stress test (03-22-2019 per pt had not any cardiac s&s since )   04-10-2016 (cardiology evaluation for precordial pain by dr t. Tresa Endo)  Low risk w/ low specificity (mild positive ekg changes at very high workload and brisk return to normal)   Hyperlipidemia    Hypogonadism male    Rotator cuff tear, right    Wears glasses    Past Surgical History:  Procedure Laterality Date   ARTHOSCOPIC ROTAOR CUFF REPAIR Right 03/23/2019   Procedure: ARTHROSCOPIC ROTATOR CUFF REPAIR; SUBACROMIAL DECOMPRESSION; BICEP TENODISIS;  Surgeon: Jones Broom, MD;  Location: [facility];  Service: Orthopedics;  Laterality: Right;  90 MIN    COLONOSCOPY  2013   LUMBAR DISC SURGERY  2002   L4 -- L5   MASS EXCISION Left 06/08/2017   Procedure: EXCISION CYST BUTTOCK;  Surgeon: Harriette Bouillon, MD;  Location: [facility];  Service: General;  Laterality: Left;   WISDOM TOOTH EXTRACTION      reports that he has never smoked. He has never used smokeless tobacco. He reports current alcohol use. He reports that he does not use drugs. family history includes ALS in his mother; Arthritis in his mother; Colon cancer in his paternal grandmother; Heart disease in his father and paternal grandfather; Stroke (age of onset: 33) in his father. Allergies  Allergen Reactions   No Known Allergies     Review of Systems  Constitutional:  Negative for malaise/fatigue.  Eyes:  Negative for blurred vision.  Respiratory:  Negative for shortness of breath.   Cardiovascular:  Negative for chest pain.  Musculoskeletal:  Positive for back pain.  Neurological:  Negative for dizziness, weakness and headaches.      Objective:     BP 124/78 (BP Location: Left Arm, Cuff Size: Normal)   Pulse 67   Temp 98.1 F (36.7 C) (Oral)   Wt 171 lb 14.4 oz (78 kg)   SpO2 98%   BMI 24.67 kg/m  BP Readings from Last 3 Encounters:  09/03/23 124/78  05/22/22 118/78  04/22/22 136/70   Wt Readings from Last  3 Encounters:  09/03/23 171 lb 14.4 oz (78 kg)  05/22/22 180 lb (81.6 kg)  04/22/22 181 lb 11.2 oz (82.4 kg)      Physical Exam Vitals reviewed.  Constitutional:      Appearance: He is well-developed.  Eyes:     Pupils: Pupils are equal, round, and reactive to light.  Neck:     Thyroid: No thyromegaly.  Cardiovascular:     Rate and Rhythm: Normal rate and regular rhythm.  Pulmonary:     Effort: Pulmonary effort is normal. No respiratory distress.     Breath sounds: Normal breath sounds. No wheezing or rales.  Musculoskeletal:     Cervical back: Neck supple.     Right lower leg: No edema.     Left lower leg: No edema.   Neurological:     Mental Status: He is alert and oriented to person, place, and time.      No results found for any visits on 09/03/23.    The 10-year ASCVD risk score (Arnett DK, et al., 2019) is: 7.5%* (Cholesterol units were assumed)    Assessment & Plan:   #1 low testosterone.  Patient currently feels well dosage of 200 mg every 10 days.  Did have slightly elevated level recently 971 but tolerating well with no adverse side effects.  When we reduced dose below this his numbers went low.  Continue current dosage for now.  Recent CBC and PSA stable  #2 chronic insomnia.  Currently on trazodone which is working fairly well.  Sleep hygiene reviewed.  Reviewed potential other options such as doxepin if he feels like the trazodone quits working.  Also discussed briefly or Orexin receptor antagonist class of medicines  #3 progressive low back pain.  MRI pending.  Currently having sciatica symptoms and was just prescribed gabapentin and tramadol.  Evelena Peat, MD

## 2023-09-28 ENCOUNTER — Other Ambulatory Visit: Payer: Self-pay | Admitting: Orthopedic Surgery

## 2023-10-01 ENCOUNTER — Encounter: Payer: Self-pay | Admitting: Family Medicine

## 2023-10-01 MED ORDER — DICLOFENAC SODIUM 75 MG PO TBEC: 75.0000 mg | DELAYED_RELEASE_TABLET | Freq: Two times a day (BID) | ORAL | 0 refills | Status: DC

## 2023-10-02 ENCOUNTER — Other Ambulatory Visit: Payer: Self-pay | Admitting: Family Medicine

## 2023-10-05 NOTE — Progress Notes (Signed)
 Surgical Instructions   Your procedure is scheduled on Thursday, March 27th, 2025. Report to [facility] Main Entrance "A" at 10:00 A.M., then check in with the Admitting office. Any questions or running late day of surgery: call [phone]  Questions prior to your surgery date: call [phone], Monday-Friday, 8am-4pm. If you experience any cold or flu symptoms such as cough, fever, chills, shortness of breath, etc. between now and your scheduled surgery, please notify us at the above number.     Remember:  Do not eat after midnight the night before your surgery  You may drink clear liquids until 10:00 the morning of your surgery.   Clear liquids allowed are: Water, Non-Citrus Juices (without pulp), Carbonated Beverages, Clear Tea (no milk, honey, etc.), Black Coffee Only (NO MILK, CREAM OR POWDERED CREAMER of any kind), and Gatorade.   Patient Instructions  The night before surgery:  No food after midnight. ONLY clear liquids after midnight  The day of surgery (if you do NOT have diabetes):  Drink ONE (1) Pre-Surgery Clear Ensure by 10:00 the morning of surgery. Drink in one sitting. Do not sip.  This drink was given to you during your hospital  pre-op appointment visit.  Nothing else to drink after completing the  Pre-Surgery Clear Ensure.          If you have questions, please contact your surgeon's office.     Take these medicines the morning of surgery with A SIP OF WATER: Finasteride (Propecia) Gabapentin (Neurontin)   May take these medicines IF NEEDED: Albuterol Inhaler (please bring with you on the day of surgery) Methocarbamol (Robaxin) Tramadol (Ultram)    One week prior to surgery, STOP taking any Aspirin (unless otherwise instructed by your surgeon) Aleve, Naproxen, Ibuprofen, Motrin, Advil, Goody's, BC's, all herbal medications, fish oil, and non-prescription vitamins.  This includes your Diclofenac (Voltaren).                     Do NOT Smoke  (Tobacco/Vaping) for 24 hours prior to your procedure.  If you use a CPAP at night, you may bring your mask/headgear for your overnight stay.   You will be asked to remove any contacts, glasses, piercing's, hearing aid's, dentures/partials prior to surgery. Please bring cases for these items if needed.    Patients discharged the day of surgery will not be allowed to drive home, and someone needs to stay with them for 24 hours.  SURGICAL WAITING ROOM VISITATION Patients may have no more than 2 support people in the waiting area - these visitors may rotate.   Pre-op nurse will coordinate an appropriate time for 1 ADULT support person, who may not rotate, to accompany patient in pre-op.  Children under the age of 26 must have an adult with them who is not the patient and must remain in the main waiting area with an adult.  If the patient needs to stay at the hospital during part of their recovery, the visitor guidelines for inpatient rooms apply.  Please refer to the [facility] website for the visitor guidelines for any additional information.   If you received a COVID test during your pre-op visit  it is requested that you wear a mask when out in public, stay away from anyone that may not be feeling well and notify your surgeon if you develop symptoms. If you have been in contact with anyone that has tested positive in the last 10 days please notify you surgeon.  Pre-operative 5 CHG Bathing Instructions   You can play a key role in reducing the risk of infection after surgery. Your skin needs to be as free of germs as possible. You can reduce the number of germs on your skin by washing with CHG (chlorhexidine gluconate) soap before surgery. CHG is an antiseptic soap that kills germs and continues to kill germs even after washing.   DO NOT use if you have an allergy to chlorhexidine/CHG or antibacterial soaps. If your skin becomes reddened or irritated, stop using the CHG and notify one  of our RNs at [phone].   Please shower with the CHG soap starting 4 days before surgery using the following schedule:     Please keep in mind the following:  DO NOT shave, including legs and underarms, starting the day of your first shower.   You may shave your face at any point before/day of surgery.  Place clean sheets on your bed the day you start using CHG soap. Use a clean washcloth (not used since being washed) for each shower. DO NOT sleep with pets once you start using the CHG.   CHG Shower Instructions:  Wash your face and private area with normal soap. If you choose to wash your hair, wash first with your normal shampoo.  After you use shampoo/soap, rinse your hair and body thoroughly to remove shampoo/soap residue.  Turn the water OFF and apply about 3 tablespoons (45 ml) of CHG soap to a CLEAN washcloth.  Apply CHG soap ONLY FROM YOUR NECK DOWN TO YOUR TOES (washing for 3-5 minutes)  DO NOT use CHG soap on face, private areas, open wounds, or sores.  Pay special attention to the area where your surgery is being performed.  If you are having back surgery, having someone wash your back for you may be helpful. Wait 2 minutes after CHG soap is applied, then you may rinse off the CHG soap.  Pat dry with a clean towel  Put on clean clothes/pajamas   If you choose to wear lotion, please use ONLY the CHG-compatible lotions that are listed below.  Additional instructions for the day of surgery: DO NOT APPLY any lotions, deodorants, cologne, or perfumes.   Do not bring valuables to the hospital. [facility] is not responsible for any belongings/valuables. Do not wear nail polish, gel polish, artificial nails, or any other type of covering on natural nails (fingers and toes) Do not wear jewelry or makeup Put on clean/comfortable clothes.  Please brush your teeth.  Ask your nurse before applying any prescription medications to the skin.     CHG Compatible Lotions   Aveeno  Moisturizing lotion  Cetaphil Moisturizing Cream  Cetaphil Moisturizing Lotion  Clairol Herbal Essence Moisturizing Lotion, Dry Skin  Clairol Herbal Essence Moisturizing Lotion, Extra Dry Skin  Clairol Herbal Essence Moisturizing Lotion, Normal Skin  Curel Age Defying Therapeutic Moisturizing Lotion with Alpha Hydroxy  Curel Extreme Care Body Lotion  Curel Soothing Hands Moisturizing Hand Lotion  Curel Therapeutic Moisturizing Cream, Fragrance-Free  Curel Therapeutic Moisturizing Lotion, Fragrance-Free  Curel Therapeutic Moisturizing Lotion, Original Formula  Eucerin Daily Replenishing Lotion  Eucerin Dry Skin Therapy Plus Alpha Hydroxy Crme  Eucerin Dry Skin Therapy Plus Alpha Hydroxy Lotion  Eucerin Original Crme  Eucerin Original Lotion  Eucerin Plus Crme Eucerin Plus Lotion  Eucerin TriLipid Replenishing Lotion  Keri Anti-Bacterial Hand Lotion  Keri Deep Conditioning Original Lotion Dry Skin Formula Softly Scented  Keri Deep Conditioning Original Lotion, Fragrance Free Sensitive  Skin Formula  Keri Lotion Fast Absorbing Fragrance Free Sensitive Skin Formula  Keri Lotion Fast Absorbing Softly Scented Dry Skin Formula  Keri Original Lotion  Keri Skin Renewal Lotion Keri Silky Smooth Lotion  Keri Silky Smooth Sensitive Skin Lotion  Nivea Body Creamy Conditioning Oil  Nivea Body Extra Enriched Lotion  Nivea Body Original Lotion  Nivea Body Sheer Moisturizing Lotion Nivea Crme  Nivea Skin Firming Lotion  NutraDerm 30 Skin Lotion  NutraDerm Skin Lotion  NutraDerm Therapeutic Skin Cream  NutraDerm Therapeutic Skin Lotion  ProShield Protective Hand Cream  Provon moisturizing lotion  Please read over the following fact sheets that you were given.

## 2023-10-06 ENCOUNTER — Encounter (HOSPITAL_COMMUNITY): Payer: Self-pay

## 2023-10-06 ENCOUNTER — Encounter (HOSPITAL_COMMUNITY)
Admission: RE | Admit: 2023-10-06 | Discharge: 2023-10-06 | Disposition: A | Discharge status: Home / Self Care | Source: Ambulatory Visit | Attending: Orthopedic Surgery | Admitting: Orthopedic Surgery

## 2023-10-06 ENCOUNTER — Other Ambulatory Visit: Payer: Self-pay

## 2023-10-06 VITALS — BP 131/83 | HR 68 | Temp 98.1°F | Resp 17 | Ht 70.0 in | Wt 174.0 lb

## 2023-10-06 DIAGNOSIS — Z01818 Encounter for other preprocedural examination: Secondary | ICD-10-CM

## 2023-10-06 DIAGNOSIS — Z01812 Encounter for preprocedural laboratory examination: Secondary | ICD-10-CM | POA: Insufficient documentation

## 2023-10-06 LAB — BASIC METABOLIC PANEL
Anion gap: 6 (ref 5–15)
BUN: 15 mg/dL (ref 8–23)
CO2: 29 mmol/L (ref 22–32)
Calcium: 9 mg/dL (ref 8.9–10.3)
Chloride: 102 mmol/L (ref 98–111)
Creatinine, Ser: 1.06 mg/dL (ref 0.61–1.24)
GFR, Estimated: >60 mL/min (ref >60)
Glucose, Bld: 105 mg/dL — ABNORMAL HIGH (ref 70–99)
Potassium: 4.8 mmol/L (ref 3.5–5.1)
Sodium: 137 mmol/L (ref 135–145)

## 2023-10-06 LAB — CBC
HCT: 46.5 % (ref 39.0–52.0)
Hemoglobin: 16 g/dL (ref 13.0–17.0)
MCH: 32.8 pg (ref 26.0–34.0)
MCHC: 34.4 g/dL (ref 30.0–36.0)
MCV: 95.3 fL (ref 80.0–100.0)
Platelets: 224 10*3/uL (ref 150–400)
RBC: 4.88 MIL/uL (ref 4.22–5.81)
RDW: 12.3 % (ref 11.5–15.5)
WBC: 6.7 10*3/uL (ref 4.0–10.5)
nRBC: 0 % (ref 0.0–0.2)

## 2023-10-06 LAB — SURGICAL PCR SCREEN
MRSA, PCR: NEGATIVE
Staphylococcus aureus: NEGATIVE

## 2023-10-06 NOTE — Progress Notes (Signed)
 PCP Smitty Cords MD Burchette  Cardiologist -   PPM/ICD - denies Device Orders - n/a Rep Notified - n/a  Chest x-ray - denies EKG -  Stress Test - 04-10-16 ECHO - 04-15-16 Cardiac Cath - denies  Sleep Study - denies CPAP -   DM- denies  Blood Thinner Instructions: denies Aspirin Instructions:n/a  ERAS Protcol - NPO   COVID TEST- n/a   Anesthesia review:  No  Patient denies shortness of breath, fever, cough and chest pain at PAT appointment   All instructions explained to the patient, with a verbal understanding of the material. Patient agrees to go over the instructions while at home for a better understanding. Patient also instructed to self quarantine after being tested for COVID-19. The opportunity to ask questions was provided.

## 2023-10-14 ENCOUNTER — Ambulatory Visit (HOSPITAL_COMMUNITY)

## 2023-10-14 ENCOUNTER — Ambulatory Visit (HOSPITAL_COMMUNITY): Admitting: Anesthesiology

## 2023-10-14 ENCOUNTER — Ambulatory Visit (HOSPITAL_COMMUNITY)
Admission: RE | Admit: 2023-10-14 | Discharge: 2023-10-14 | Disposition: A | Discharge status: Home / Self Care | Attending: Orthopedic Surgery | Admitting: Orthopedic Surgery

## 2023-10-14 ENCOUNTER — Other Ambulatory Visit: Payer: Self-pay

## 2023-10-14 ENCOUNTER — Encounter (HOSPITAL_COMMUNITY)
Admission: RE | Disposition: A | Discharge status: Home / Self Care | Payer: Self-pay | Source: Home / Self Care | Attending: Orthopedic Surgery

## 2023-10-14 ENCOUNTER — Encounter (HOSPITAL_COMMUNITY): Payer: Self-pay | Admitting: Orthopedic Surgery

## 2023-10-14 DIAGNOSIS — M5116 Intervertebral disc disorders with radiculopathy, lumbar region: Secondary | ICD-10-CM | POA: Insufficient documentation

## 2023-10-14 DIAGNOSIS — M1990 Unspecified osteoarthritis, unspecified site: Secondary | ICD-10-CM | POA: Insufficient documentation

## 2023-10-14 HISTORY — PX: LUMBAR LAMINECTOMY/DECOMPRESSION MICRODISCECTOMY: SHX5026

## 2023-10-14 SURGERY — LUMBAR LAMINECTOMY/DECOMPRESSION MICRODISCECTOMY 1 LEVEL
Anesthesia: General | Laterality: Right

## 2023-10-14 MED ORDER — PHENYLEPHRINE HCL-NACL 20-0.9 MG/250ML-% IV SOLN
INTRAVENOUS | Status: DC | PRN
  Administered 2023-10-14: 20 ug/min via INTRAVENOUS

## 2023-10-14 MED ORDER — CHLORHEXIDINE GLUCONATE 0.12 % MT SOLN: 15.0000 mL | Freq: Once | OROMUCOSAL | Status: AC

## 2023-10-14 MED ORDER — POVIDONE-IODINE 7.5 % EX SOLN
Freq: Once | CUTANEOUS | Status: DC
  Filled 2023-10-14: qty 118

## 2023-10-14 MED ORDER — ONDANSETRON HCL 4 MG/2ML IJ SOLN
INTRAMUSCULAR | Status: AC
  Filled 2023-10-14: qty 2

## 2023-10-14 MED ORDER — ROCURONIUM BROMIDE 10 MG/ML (PF) SYRINGE
PREFILLED_SYRINGE | INTRAVENOUS | Status: DC | PRN
  Administered 2023-10-14: 60 mg via INTRAVENOUS
  Administered 2023-10-14: 10 mg via INTRAVENOUS
  Administered 2023-10-14: 20 mg via INTRAVENOUS

## 2023-10-14 MED ORDER — MIDAZOLAM HCL 2 MG/2ML IJ SOLN
INTRAMUSCULAR | Status: DC | PRN
  Administered 2023-10-14: 2 mg via INTRAVENOUS

## 2023-10-14 MED ORDER — CEFAZOLIN SODIUM-DEXTROSE 2-4 GM/100ML-% IV SOLN
INTRAVENOUS | Status: AC
  Filled 2023-10-14: qty 100

## 2023-10-14 MED ORDER — THROMBIN 20000 UNITS EX SOLR
CUTANEOUS | Status: DC | PRN
  Administered 2023-10-14: 20000 [IU] via TOPICAL

## 2023-10-14 MED ORDER — LIDOCAINE 2% (20 MG/ML) 5 ML SYRINGE
INTRAMUSCULAR | Status: DC | PRN
  Administered 2023-10-14: 100 mg via INTRAVENOUS

## 2023-10-14 MED ORDER — ROCURONIUM BROMIDE 10 MG/ML (PF) SYRINGE
PREFILLED_SYRINGE | INTRAVENOUS | Status: AC
  Filled 2023-10-14: qty 10

## 2023-10-14 MED ORDER — LACTATED RINGERS IV SOLN: INTRAVENOUS | Status: DC

## 2023-10-14 MED ORDER — BUPIVACAINE LIPOSOME 1.3 % IJ SUSP
INTRAMUSCULAR | Status: AC
  Filled 2023-10-14: qty 20

## 2023-10-14 MED ORDER — ONDANSETRON HCL 4 MG/2ML IJ SOLN
INTRAMUSCULAR | Status: DC | PRN
  Administered 2023-10-14: 4 mg via INTRAVENOUS

## 2023-10-14 MED ORDER — SUGAMMADEX SODIUM 200 MG/2ML IV SOLN
INTRAVENOUS | Status: DC | PRN
  Administered 2023-10-14: 200 mg via INTRAVENOUS

## 2023-10-14 MED ORDER — METHYLENE BLUE (ANTIDOTE) 1 % IV SOLN
INTRAVENOUS | Status: AC
  Filled 2023-10-14: qty 10

## 2023-10-14 MED ORDER — BUPIVACAINE-EPINEPHRINE 0.25% -1:200000 IJ SOLN
INTRAMUSCULAR | Status: DC | PRN
  Administered 2023-10-14: 30 mL

## 2023-10-14 MED ORDER — ACETAMINOPHEN 500 MG PO TABS
ORAL_TABLET | ORAL | Status: AC
  Administered 2023-10-14: 1000 mg via ORAL
  Filled 2023-10-14: qty 2

## 2023-10-14 MED ORDER — ORAL CARE MOUTH RINSE: 15.0000 mL | Freq: Once | OROMUCOSAL | Status: AC

## 2023-10-14 MED ORDER — DROPERIDOL 2.5 MG/ML IJ SOLN: 0.6250 mg | Freq: Once | INTRAMUSCULAR | Status: DC | PRN

## 2023-10-14 MED ORDER — METHYLPREDNISOLONE ACETATE 40 MG/ML IJ SUSP
INTRAMUSCULAR | Status: DC | PRN
  Administered 2023-10-14: 40 mg via INTRAMUSCULAR

## 2023-10-14 MED ORDER — HYDROCODONE-ACETAMINOPHEN 5-325 MG PO TABS: 1.0000 | ORAL_TABLET | Freq: Four times a day (QID) | ORAL | 0 refills | Status: DC | PRN

## 2023-10-14 MED ORDER — METHOCARBAMOL 500 MG PO TABS
500.0000 mg | ORAL_TABLET | Freq: Four times a day (QID) | ORAL | 0 refills | Status: DC | PRN
Start: 2023-10-14 — End: 2023-11-24

## 2023-10-14 MED ORDER — LIDOCAINE 2% (20 MG/ML) 5 ML SYRINGE
INTRAMUSCULAR | Status: AC
  Filled 2023-10-14: qty 5

## 2023-10-14 MED ORDER — METHYLPREDNISOLONE ACETATE 40 MG/ML IJ SUSP
INTRAMUSCULAR | Status: AC
  Filled 2023-10-14: qty 1

## 2023-10-14 MED ORDER — CEFAZOLIN SODIUM-DEXTROSE 2-4 GM/100ML-% IV SOLN
2.0000 g | INTRAVENOUS | Status: AC
  Administered 2023-10-14: 2 g via INTRAVENOUS

## 2023-10-14 MED ORDER — THROMBIN 20000 UNITS EX KIT
PACK | CUTANEOUS | Status: AC
  Filled 2023-10-14: qty 1

## 2023-10-14 MED ORDER — HYDROMORPHONE HCL 1 MG/ML IJ SOLN: 0.2500 mg | INTRAMUSCULAR | Status: DC | PRN

## 2023-10-14 MED ORDER — PHENYLEPHRINE 80 MCG/ML (10ML) SYRINGE FOR IV PUSH (FOR BLOOD PRESSURE SUPPORT)
PREFILLED_SYRINGE | INTRAVENOUS | Status: AC
  Filled 2023-10-14: qty 10

## 2023-10-14 MED ORDER — DEXAMETHASONE SODIUM PHOSPHATE 10 MG/ML IJ SOLN
INTRAMUSCULAR | Status: AC
  Filled 2023-10-14: qty 1

## 2023-10-14 MED ORDER — FENTANYL CITRATE (PF) 250 MCG/5ML IJ SOLN
INTRAMUSCULAR | Status: DC | PRN
  Administered 2023-10-14 (×2): 50 ug via INTRAVENOUS
  Administered 2023-10-14: 100 ug via INTRAVENOUS

## 2023-10-14 MED ORDER — BUPIVACAINE-EPINEPHRINE (PF) 0.25% -1:200000 IJ SOLN
INTRAMUSCULAR | Status: AC
  Filled 2023-10-14: qty 30

## 2023-10-14 MED ORDER — MIDAZOLAM HCL 2 MG/2ML IJ SOLN
INTRAMUSCULAR | Status: AC
  Filled 2023-10-14: qty 2

## 2023-10-14 MED ORDER — DEXAMETHASONE SODIUM PHOSPHATE 10 MG/ML IJ SOLN
INTRAMUSCULAR | Status: DC | PRN
  Administered 2023-10-14: 10 mg via INTRAVENOUS

## 2023-10-14 MED ORDER — BUPIVACAINE LIPOSOME 1.3 % IJ SUSP
INTRAMUSCULAR | Status: DC | PRN
  Administered 2023-10-14: 20 mL
  Administered 2023-10-14: 10 mL

## 2023-10-14 MED ORDER — ACETAMINOPHEN 500 MG PO TABS: 1000.0000 mg | ORAL_TABLET | Freq: Once | ORAL | Status: AC

## 2023-10-14 MED ORDER — HEMOSTATIC AGENTS (NO CHARGE) OPTIME
TOPICAL | Status: DC | PRN
  Administered 2023-10-14: 1 via TOPICAL

## 2023-10-14 MED ORDER — FENTANYL CITRATE (PF) 250 MCG/5ML IJ SOLN
INTRAMUSCULAR | Status: AC
  Filled 2023-10-14: qty 5

## 2023-10-14 MED ORDER — 0.9 % SODIUM CHLORIDE (POUR BTL) OPTIME
TOPICAL | Status: DC | PRN
  Administered 2023-10-14: 1000 mL

## 2023-10-14 MED ORDER — CHLORHEXIDINE GLUCONATE 0.12 % MT SOLN
OROMUCOSAL | Status: AC
  Administered 2023-10-14: 15 mL via OROMUCOSAL
  Filled 2023-10-14: qty 15

## 2023-10-14 MED ORDER — PHENYLEPHRINE 80 MCG/ML (10ML) SYRINGE FOR IV PUSH (FOR BLOOD PRESSURE SUPPORT)
PREFILLED_SYRINGE | INTRAVENOUS | Status: DC | PRN
  Administered 2023-10-14: 160 ug via INTRAVENOUS
  Administered 2023-10-14: 80 ug via INTRAVENOUS

## 2023-10-14 MED ORDER — ALBUMIN HUMAN 5 % IV SOLN: INTRAVENOUS | Status: DC | PRN

## 2023-10-14 MED ORDER — PROPOFOL 10 MG/ML IV BOLUS
INTRAVENOUS | Status: DC | PRN
  Administered 2023-10-14: 100 ug/kg/min via INTRAVENOUS
  Administered 2023-10-14: 140 mg via INTRAVENOUS

## 2023-10-14 SURGICAL SUPPLY — 62 items
BAG COUNTER SPONGE SURGICOUNT (BAG) ×2 IMPLANT
BENZOIN TINCTURE PRP APPL 2/3 (GAUZE/BANDAGES/DRESSINGS) IMPLANT
BNDG GAUZE DERMACEA FLUFF 4 (GAUZE/BANDAGES/DRESSINGS) IMPLANT
BUR ROUND PRECISION 4.0 (BURR) ×2 IMPLANT
CABLE BIPOLOR RESECTION CORD (MISCELLANEOUS) ×2 IMPLANT
CANISTER SUCT 3000ML PPV (MISCELLANEOUS) ×2 IMPLANT
COVER SURGICAL LIGHT HANDLE (MISCELLANEOUS) ×2 IMPLANT
DRAIN CHANNEL 15F RND FF W/TCR (WOUND CARE) IMPLANT
DRAPE POUCH INSTRU U-SHP 10X18 (DRAPES) ×4 IMPLANT
DRAPE SURG 17X23 STRL (DRAPES) ×8 IMPLANT
DURAPREP 26ML APPLICATOR (WOUND CARE) ×2 IMPLANT
ELECT BLADE 4.0 EZ CLEAN MEGAD (MISCELLANEOUS) IMPLANT
ELECT CAUTERY BLADE 6.4 (BLADE) ×2 IMPLANT
ELECT REM PT RETURN 9FT ADLT (ELECTROSURGICAL) ×1 IMPLANT
ELECTRODE BLDE 4.0 EZ CLN MEGD (MISCELLANEOUS) IMPLANT
ELECTRODE REM PT RTRN 9FT ADLT (ELECTROSURGICAL) ×2 IMPLANT
EVACUATOR SILICONE 100CC (DRAIN) IMPLANT
FILTER STRAW FLUID ASPIR (MISCELLANEOUS) ×2 IMPLANT
GAUZE 4X4 16PLY ~~LOC~~+RFID DBL (SPONGE) ×4 IMPLANT
GAUZE SPONGE 4X4 12PLY STRL (GAUZE/BANDAGES/DRESSINGS) ×2 IMPLANT
GLOVE BIO SURGEON STRL SZ 6.5 (GLOVE) ×2 IMPLANT
GLOVE BIO SURGEON STRL SZ8 (GLOVE) ×2 IMPLANT
GLOVE BIOGEL PI IND STRL 7.0 (GLOVE) ×2 IMPLANT
GLOVE BIOGEL PI IND STRL 8 (GLOVE) ×2 IMPLANT
GLOVE SURG ENC MOIS LTX SZ6.5 (GLOVE) ×2 IMPLANT
GOWN STRL REUS W/ TWL LRG LVL3 (GOWN DISPOSABLE) ×2 IMPLANT
GOWN STRL REUS W/ TWL XL LVL3 (GOWN DISPOSABLE) ×4 IMPLANT
IV CATH 14GX2 1/4 (CATHETERS) ×2 IMPLANT
KIT BASIN OR (CUSTOM PROCEDURE TRAY) ×2 IMPLANT
KIT POSITION SURG JACKSON T1 (MISCELLANEOUS) ×2 IMPLANT
KIT TURNOVER KIT B (KITS) ×2 IMPLANT
NDL 18GX1X1/2 (RX/OR ONLY) (NEEDLE) ×2 IMPLANT
NDL 22X1.5 STRL (OR ONLY) (MISCELLANEOUS) ×2 IMPLANT
NDL HYPO 25GX1X1/2 BEV (NEEDLE) ×2 IMPLANT
NDL SPNL 18GX3.5 QUINCKE PK (NEEDLE) ×4 IMPLANT
NEEDLE 18GX1X1/2 (RX/OR ONLY) (NEEDLE) ×2 IMPLANT
NEEDLE 22X1.5 STRL (OR ONLY) (MISCELLANEOUS) ×1 IMPLANT
NEEDLE HYPO 25GX1X1/2 BEV (NEEDLE) ×1 IMPLANT
NEEDLE SPNL 18GX3.5 QUINCKE PK (NEEDLE) ×2 IMPLANT
NS IRRIG 1000ML POUR BTL (IV SOLUTION) ×2 IMPLANT
PACK LAMINECTOMY ORTHO (CUSTOM PROCEDURE TRAY) ×2 IMPLANT
PACK UNIVERSAL I (CUSTOM PROCEDURE TRAY) ×2 IMPLANT
PAD ARMBOARD POSITIONER FOAM (MISCELLANEOUS) ×4 IMPLANT
PATTIES SURGICAL .5 X.5 (GAUZE/BANDAGES/DRESSINGS) IMPLANT
PATTIES SURGICAL .5 X1 (DISPOSABLE) ×2 IMPLANT
SPONGE INTESTINAL PEANUT (DISPOSABLE) ×2 IMPLANT
SPONGE SURGIFOAM ABS GEL 12-7 (HEMOSTASIS) IMPLANT
SPONGE SURGIFOAM ABS GEL SZ50 (HEMOSTASIS) ×2 IMPLANT
STRIP CLOSURE SKIN 1/2X4 (GAUZE/BANDAGES/DRESSINGS) IMPLANT
SURGIFLO W/THROMBIN 8M KIT (HEMOSTASIS) IMPLANT
SUT MNCRL AB 4-0 PS2 18 (SUTURE) ×2 IMPLANT
SUT VIC AB 0 CT1 18XCR BRD 8 (SUTURE) IMPLANT
SUT VIC AB 1 CT1 18XCR BRD 8 (SUTURE) ×2 IMPLANT
SUT VIC AB 2-0 CT2 18 VCP726D (SUTURE) ×2 IMPLANT
SYR 20ML LL LF (SYRINGE) IMPLANT
SYR BULB IRRIG 60ML STRL (SYRINGE) ×2 IMPLANT
SYR CONTROL 10ML LL (SYRINGE) ×4 IMPLANT
SYR TB 1ML LUER SLIP (SYRINGE) ×8 IMPLANT
TOWEL GREEN STERILE (TOWEL DISPOSABLE) ×2 IMPLANT
TOWEL GREEN STERILE FF (TOWEL DISPOSABLE) ×2 IMPLANT
WATER STERILE IRR 1000ML POUR (IV SOLUTION) ×2 IMPLANT
YANKAUER SUCT BULB TIP NO VENT (SUCTIONS) ×2 IMPLANT

## 2023-10-14 NOTE — Transfer of Care (Signed)
 Immediate Anesthesia Transfer of Care Note  Patient: Kenneth Knapp  Procedure(s) Performed: LUMBAR LAMINECTOMY/DECOMPRESSION MICRODISCECTOMY 1 LEVEL (Right)  Patient Location: PACU  Anesthesia Type:General  Level of Consciousness: awake, alert , and oriented  Airway & Oxygen Therapy: Patient Spontanous Breathing and Patient connected to face mask oxygen  Post-op Assessment: Report given to RN and Post -op Vital signs reviewed and stable  Post vital signs: Reviewed and stable  Last Vitals:  Vitals Value Taken Time  BP 132/82 10/14/23 1530  Temp 37 C 10/14/23 1525  Pulse 77 10/14/23 1532  Resp 13 10/14/23 1532  SpO2 99 % 10/14/23 1532  Vitals shown include unfiled device data.  Last Pain:  Vitals:   10/14/23 1525  TempSrc:   PainSc: 0-No pain         Complications: No notable events documented.

## 2023-10-14 NOTE — Anesthesia Preprocedure Evaluation (Addendum)
 Anesthesia Evaluation  Patient identified by MRN, date of birth, ID band  Reviewed: Allergy & Precautions, NPO status , Patient's Chart, lab work & pertinent test results  History of Anesthesia Complications Negative for: history of anesthetic complications  Airway Mallampati: II  TM Distance: >3 FB Neck ROM: Full    Dental no notable dental hx.    Pulmonary neg pulmonary ROS   Pulmonary exam normal        Cardiovascular negative cardio ROS Normal cardiovascular exam     Neuro/Psych LUMBAR RADICULOPATHY    GI/Hepatic negative GI ROS, Neg liver ROS,,,  Endo/Other  negative endocrine ROS    Renal/GU negative Renal ROS     Musculoskeletal  (+) Arthritis ,    Abdominal   Peds  Hematology negative hematology ROS (+)   Anesthesia Other Findings Day of surgery medications reviewed with patient.  Reproductive/Obstetrics                              Anesthesia Physical Anesthesia Plan  ASA: 1  Anesthesia Plan: General   Post-op Pain Management: Tylenol PO (pre-op)*   Induction: Intravenous  PONV Risk Score and Plan: 2 and Ondansetron, Dexamethasone, Treatment may vary due to age or medical condition and Midazolam  Airway Management Planned: Oral ETT  Additional Equipment: None  Intra-op Plan:   Post-operative Plan: Extubation in OR  Informed Consent: I have reviewed the patients History and Physical, chart, labs and discussed the procedure including the risks, benefits and alternatives for the proposed anesthesia with the patient or authorized representative who has indicated his/her understanding and acceptance.     Dental advisory given  Plan Discussed with: CRNA  Anesthesia Plan Comments:          Anesthesia Quick Evaluation

## 2023-10-14 NOTE — H&P (Signed)
 PREOPERATIVE H&P  Chief Complaint: Right leg pain  HPI: Kenneth Knapp is a 63 y.o. 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 Tresa Endo)  Low risk w/ low specificity (mild positive ekg changes at very high workload and brisk return to normal)   Hyperlipidemia    Hypogonadism male    Rotator cuff tear, right    Wears glasses    Past Surgical History:  Procedure Laterality Date   ARTHOSCOPIC ROTAOR CUFF REPAIR Right 03/23/2019   Procedure: ARTHROSCOPIC ROTATOR CUFF REPAIR; SUBACROMIAL DECOMPRESSION; BICEP TENODISIS;  Surgeon: Jones Broom, MD;  Location: [facility];  Service: Orthopedics;  Laterality: Right;  90 MIN   COLONOSCOPY  2013   LUMBAR DISC SURGERY  2002   L4 -- L5   MASS EXCISION Left 06/08/2017   Procedure: EXCISION CYST BUTTOCK;  Surgeon: Harriette Bouillon, MD;  Location: [facility];  Service: General;  Laterality: Left;   WISDOM TOOTH EXTRACTION     Social History   Socioeconomic History   Marital status: Divorced    Spouse name: Not on file   Number of children: Not on file   Years of education: Not on file   Highest education level: Not on file  Occupational History   Not on file  Tobacco Use   Smoking status: Never   Smokeless tobacco: Never  Vaping Use   Vaping status: Never Used  Substance and Sexual Activity    Alcohol use: Yes    Comment: 1 DRINK PER DAY   Drug use: Never   Sexual activity: Not on file  Other Topics Concern   Not on file  Social History Narrative   Not on file   Social Drivers of Health   Financial Resource Strain: Not on file  Food Insecurity: Not on file  Transportation Needs: Not on file  Physical Activity: Not on file  Stress: Not on file  Social Connections: Not on file   Family History  Problem Relation Age of Onset   ALS Mother    Arthritis Mother    Heart disease Father    Stroke Father 51       hemorrhagic   Colon cancer Paternal Grandmother    Heart disease Paternal Grandfather    Stomach cancer Neg Hx    Rectal cancer Neg Hx    Esophageal cancer Neg Hx    No Known Allergies Prior to Admission medications   Medication Sig Start Date End Date Taking? Authorizing Provider  cholecalciferol (VITAMIN D3) 25 MCG (1000 UNIT) tablet Take 1,000 Units by mouth daily.   Yes [provider]  Coenzyme Q10 (CO Q-10 PO) Take 1 capsule by mouth daily.   Yes [provider]  diclofenac (VOLTAREN) 75 MG EC tablet Take 1 tablet (75 mg total) by mouth 2 (two) times daily. 10/01/23  Yes Burchette, Elberta Fortis, MD  EPINEPHrine 0.3 mg/0.3 mL IJ SOAJ injection Inject 0.3 mg into the muscle as needed for anaphylaxis. Use as needed 03/24/16  Yes [provider]  fexofenadine (ALLEGRA) 180 MG tablet Take 180 mg by mouth at bedtime.   Yes [provider]  finasteride (PROPECIA) 1 MG tablet Take 1 mg by mouth daily. 09/29/23  Yes [provider]  gabapentin (NEURONTIN) 300 MG capsule Take 300 mg by mouth 3 (three) times daily. 09/29/23  Yes [provider]  lovastatin (MEVACOR) 20 MG tablet TAKE 1 TABLET BY MOUTH EVERYDAY AT BEDTIME 11/12/22  Yes Burchette, Elberta Fortis, MD  MAGNESIUM PO Take 1 tablet by mouth daily.   Yes [provider]  methocarbamol (ROBAXIN) 500 MG tablet Take 500 mg by mouth every 6 (six) hours as needed for  muscle spasms. 09/24/23  Yes [provider]  Multiple Vitamin (MULTIVITAMIN) tablet Take 1 tablet by mouth daily.   Yes [provider]  traMADol (ULTRAM) 50 MG tablet Take 50 mg by mouth at bedtime. 09/20/23  Yes [provider]  traZODone (DESYREL) 100 MG tablet Take 1 tablet (100 mg total) by mouth at bedtime. 04/30/23  Yes Burchette, Elberta Fortis, MD  TURMERIC PO Take 1 capsule by mouth daily.   Yes [provider]  zolpidem (AMBIEN) 5 MG tablet TAKE 1 TO 2 TABLETS BY MOUTH AT BEDTIME AS NEEDED FOR INSOMNIA 03/25/23  Yes Burchette, Elberta Fortis, MD  albuterol (PROAIR HFA) 108 (90 Base) MCG/ACT inhaler Inhale 2 puffs into the lungs every 6 (six) hours as needed for wheezing or shortness of breath. 05/16/20   Terressa Koyanagi, DO  COVID-19 Ad26 vaccine, JANSSEN/J&J, 0.5 ML injection Inject into the muscle. Patient not taking: Reported on 10/01/2023 01/03/21   Judyann Munson, MD  COVID-19 mRNA vaccine, Moderna, 100 MCG/0.5ML injection Inject into the muscle. Patient not taking: Reported on 10/01/2023 05/01/21   Judyann Munson, MD  PRESCRIPTION MEDICATION ALLERGY INJECTIONS WEEKLY    [provider]  sildenafil (VIAGRA) 50 MG tablet TAKE 1 TABLET BY MOUTH 1 HOUR PRIOR TO SEXUAL ACTIVITY 01/06/23   Burchette, Elberta Fortis, MD  testosterone cypionate (DEPOTESTOSTERONE CYPIONATE) 200 MG/ML injection INJECT 1.5 ML (300 MG) INTO THE MUSCLE EVERY 14 DAYS 10/04/23   Burchette, Elberta Fortis, MD     All other systems have been reviewed and were otherwise negative with the exception of those mentioned in the HPI and as above.  Physical Exam: There were no vitals filed for this visit.  There is no height or weight on file to calculate BMI.  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

## 2023-10-14 NOTE — Anesthesia Postprocedure Evaluation (Signed)
 Anesthesia Post Note  Patient: RAMELO OETKEN  Procedure(s) Performed: LUMBAR LAMINECTOMY/DECOMPRESSION MICRODISCECTOMY 1 LEVEL (Right)     Patient location during evaluation: PACU Anesthesia Type: General Level of consciousness: awake and alert Pain management: pain level controlled Vital Signs Assessment: post-procedure vital signs reviewed and stable Respiratory status: spontaneous breathing, nonlabored ventilation and respiratory function stable Cardiovascular status: blood pressure returned to baseline Postop Assessment: no apparent nausea or vomiting Anesthetic complications: no   No notable events documented.  Last Vitals:  Vitals:   10/14/23 1545 10/14/23 1600  BP: 138/82 116/84  Pulse: 73 73  Resp: 13 16  Temp:  36.5 C  SpO2: 100% 99%    Last Pain:  Vitals:   10/14/23 1545  TempSrc:   PainSc: 0-No pain                 Shanda Howells

## 2023-10-14 NOTE — Op Note (Signed)
 PATIENT NAME: Kenneth Knapp   MEDICAL RECORD NO.:   [id number]   DATE OF BIRTH: 02/26/1961   DATE OF PROCEDURE: 10/14/2023                                       OPERATIVE REPORT     PREOPERATIVE DIAGNOSES: 1. Right-sided L5 radiculopathy. 2. 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   POSTOPERATIVE DIAGNOSES: 1. Right-sided L5 radiculopathy. 2. 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   PROCEDURES:  Right-sided L4/5 laminotomy with partial facetectomy and removal of herniated left-sided L4/5 disk fragment.   SURGEON:  Estill Bamberg, MD.   ASSISTANTJason Coop, PA-C.   ANESTHESIA:  General endotracheal anesthesia.   COMPLICATIONS:  None.   DISPOSITION:  Stable.   ESTIMATED BLOOD LOSS:  Minimal.   INDICATIONS FOR SURGERY:  Briefly, [name] is a pleasant 63 year old male, who did present to me with severe pain in the right leg.  The patient's MRI did reveal the findings outlined above.   The patient was fully made aware of the risks of surgery, including the risk of recurrent herniation and the need for subsequent surgery, including the possibility of a subsequent diskectomy and/or fusion.   OPERATIVE DETAILS:  On 10/14/2023, the patient was brought to surgery and general endotracheal anesthesia was administered.  The patient was placed prone on a well-padded flat Jackson bed with a spinal frame.  Antibiotics were given.  The back was prepped and draped and a time-out procedure was performed.  At this point, a midline incision was made directly over the L4/5 intervertebral space.  A curvilinear incision was made just to the right of the midline into the fascia.  A self-retaining McCulloch retractor was placed.  The lamina of L4 and L5 was identified and subperiosteally exposed.  I then removed the lateral aspect of the L4/5 ligamentum flavum.  Readily identified was the traversing  right L5 nerve, which was noted to be under obvious tension, and was noted to be rather erythematous.  I was able to gently gain medial retraction of the nerve, and in doing so, large herniated disk fragments were readily noted.  The fragments were noted to be migrated inferiorly, ventral to the L5 nerve, and dorsal to the L5 vertebral body.  The fragments were uneventfully removed in multiple segments.   At this point, the traversing right L5 nerve was evaluated, and was noted to be free and mobile, and entirely free of any compression. I was very pleased with the final decompression that I was able to accomplish.  At this point, the wound was copiously irrigated with normal saline.  All epidural bleeding was controlled using bipolar electrocautery in addition to Surgiflo. All bleeding was controlled at the termination of the procedure.  At this point, 40 mg of Depo-Medrol was introduced about the epidural space in the region of the right L5 nerve.  The wound was then closed in layers using #1 Vicryl followed by 2-0 Vicryl, followed by 4-0 Monocryl. Benzoin and Steri-Strips were applied followed by a sterile dressing. All instrument counts were correct at the termination of the procedure.   Of note, Jason Coop was my assistant throughout surgery, and did aid in retraction, suctioning, and closure from start to finish.     Loraine Leriche  Abimael Zeiter, MD

## 2023-10-14 NOTE — Anesthesia Procedure Notes (Signed)
 Procedure Name: Intubation Date/Time: 10/14/2023 12:43 PM  Performed by: Camillia Herter, CRNAPre-anesthesia Checklist: Patient identified, Emergency Drugs available, Suction available and Patient being monitored Patient Re-evaluated:Patient Re-evaluated prior to induction Oxygen Delivery Method: Circle System Utilized Preoxygenation: Pre-oxygenation with 100% oxygen Induction Type: IV induction Ventilation: Mask ventilation without difficulty Laryngoscope Size: Mac and 4 Tube type: Oral Tube size: 7.5 mm Number of attempts: 1 Airway Equipment and Method: Stylet and Oral airway Placement Confirmation: ETT inserted through vocal cords under direct vision, positive ETCO2 and breath sounds checked- equal and bilateral Secured at: 23 cm Tube secured with: Tape Dental Injury: Teeth and Oropharynx as per pre-operative assessment

## 2023-10-15 ENCOUNTER — Encounter (HOSPITAL_COMMUNITY): Payer: Self-pay | Admitting: Orthopedic Surgery

## 2023-11-01 ENCOUNTER — Other Ambulatory Visit: Payer: Self-pay | Admitting: Family Medicine

## 2023-11-03 ENCOUNTER — Other Ambulatory Visit: Payer: Self-pay | Admitting: Family Medicine

## 2023-11-05 ENCOUNTER — Other Ambulatory Visit: Payer: Self-pay | Admitting: Family Medicine

## 2023-11-20 ENCOUNTER — Encounter: Payer: Self-pay | Admitting: Family Medicine

## 2023-11-24 ENCOUNTER — Encounter: Payer: Self-pay | Admitting: Family Medicine

## 2023-11-24 ENCOUNTER — Telehealth (INDEPENDENT_AMBULATORY_CARE_PROVIDER_SITE_OTHER): Admitting: Family Medicine

## 2023-11-24 VITALS — Wt 170.0 lb

## 2023-11-24 DIAGNOSIS — F5104 Psychophysiologic insomnia: Secondary | ICD-10-CM

## 2023-11-24 MED ORDER — ZALEPLON 5 MG PO CAPS: 5.0000 mg | ORAL_CAPSULE | Freq: Every evening | ORAL | 5 refills | Status: DC | PRN

## 2023-11-24 NOTE — Progress Notes (Signed)
 Patient ID: Kenneth Knapp, male   DOB: [birth year], 63 y.o.   MRN: [id number]   Virtual Visit via Video Note  I connected with [name] on 11/24/23 at 11:00 AM EDT by a video enabled telemedicine application and verified that I am speaking with the correct person using two identifiers.  Location patient: home Location provider:work or home office Persons participating in the virtual visit: patient, provider  I discussed the limitations of evaluation and management by telemedicine and the availability of in person appointments. The patient expressed understanding and agreed to proceed.   HPI:  Kenneth Knapp set up appointment to discuss his chronic insomnia.  He had been taking trazodone  100 mg at night but is requiring usually 100 to 150 for sleep.  Even with higher dosage he usually only gets about 6 hours sleep.  He frequently wakes up early morning hours and has taken Ambien  as a supplement in the past but sometimes has some hangover "brain fog ".  Without any medication he has struggled tremendously getting to sleep.  Does not use alcohol regularly at night.  Knows to avoid bright lights and late day use of caffeine.  Has tried melatonin previously without success.  Currently not taking gabapentin.  Would like to explore possibly other options for adjunct medication at night when he wakes up early   ROS: See pertinent positives and negatives per HPI.  Past Medical History:  Diagnosis Date   AR (allergic rhinitis)    BPH (benign prostatic hyperplasia)    ED (erectile dysfunction)    History of echocardiogram    04-15-2016  mild LVH, ef 60-65%   History of exercise stress test (03-22-2019 per pt had not any cardiac s&s since )   04-10-2016 (cardiology evaluation for precordial pain by dr t. Loetta Ringer)  Low risk w/ low specificity (mild positive ekg changes at very high workload and brisk return to normal)   Hyperlipidemia    Hypogonadism male    Rotator cuff tear, right    Wears glasses      Past Surgical History:  Procedure Laterality Date   ARTHOSCOPIC ROTAOR CUFF REPAIR Right 03/23/2019   Procedure: ARTHROSCOPIC ROTATOR CUFF REPAIR; SUBACROMIAL DECOMPRESSION; BICEP TENODISIS;  Surgeon: [name], MD;  Location: [facility] [place];  Service: Orthopedics;  Laterality: Right;  90 MIN   COLONOSCOPY  2013   LUMBAR DISC SURGERY  2002   L4 -- L5   LUMBAR LAMINECTOMY/DECOMPRESSION MICRODISCECTOMY Right 10/14/2023   Procedure: LUMBAR LAMINECTOMY/DECOMPRESSION MICRODISCECTOMY 1 LEVEL;  Surgeon: [name], MD;  Location: [facility] OR;  Service: Orthopedics;  Laterality: Right;  RIGHT-SIDED LUMBAR 4 - LUMBAR 5 MICRODISCECTOMY   MASS EXCISION Left 06/08/2017   Procedure: EXCISION CYST BUTTOCK;  Surgeon: [name], MD;  Location: [facility];  Service: General;  Laterality: Left;   WISDOM TOOTH EXTRACTION      Family History  Problem Relation Age of Onset   ALS Mother    Arthritis Mother    Heart disease Father    Stroke Father 18       hemorrhagic   Colon cancer Paternal Grandmother    Heart disease Paternal Grandfather    Stomach cancer Neg Hx    Rectal cancer Neg Hx    Esophageal cancer Neg Hx     SOCIAL HX: Non-smoker   Current Outpatient Medications:    albuterol  (PROAIR  HFA) 108 (90 Base) MCG/ACT inhaler, Inhale 2 puffs into the lungs every 6 (six) hours as needed for wheezing or  shortness of breath., Disp: 1 each, Rfl: 0   cholecalciferol (VITAMIN D3) 25 MCG (1000 UNIT) tablet, Take 1,000 Units by mouth daily., Disp: , Rfl:    Coenzyme Q10 (CO Q-10 PO), Take 1 capsule by mouth daily., Disp: , Rfl:    EPINEPHrine  0.3 mg/0.3 mL IJ SOAJ injection, Inject 0.3 mg into the muscle as needed for anaphylaxis. Use as needed, Disp: , Rfl:    fexofenadine (ALLEGRA) 180 MG tablet, Take 180 mg by mouth at bedtime., Disp: , Rfl:    finasteride (PROPECIA) 1 MG tablet, Take 1 mg by mouth daily., Disp: , Rfl:    gabapentin (NEURONTIN) 300 MG capsule,  Take 300 mg by mouth 3 (three) times daily., Disp: , Rfl:    lovastatin  (MEVACOR ) 20 MG tablet, TAKE 1 TABLET BY MOUTH EVERYDAY AT BEDTIME, Disp: 30 tablet, Rfl: 0   MAGNESIUM PO, Take 1 tablet by mouth daily., Disp: , Rfl:    Multiple Vitamin (MULTIVITAMIN) tablet, Take 1 tablet by mouth daily., Disp: , Rfl:    PRESCRIPTION MEDICATION, ALLERGY INJECTIONS WEEKLY, Disp: , Rfl:    sildenafil  (VIAGRA ) 50 MG tablet, TAKE 1 TABLET BY MOUTH 1 HOUR PRIOR TO SEXUAL ACTIVITY, Disp: 6 tablet, Rfl: 3   testosterone  cypionate (DEPOTESTOSTERONE CYPIONATE) 200 MG/ML injection, INJECT 1.5 ML (300 MG) INTO THE MUSCLE EVERY 14 DAYS, Disp: 10 mL, Rfl: 0   TURMERIC PO, Take 1 capsule by mouth daily., Disp: , Rfl:    zaleplon (SONATA) 5 MG capsule, Take 1 capsule (5 mg total) by mouth at bedtime as needed for sleep., Disp: 30 capsule, Rfl: 5  EXAM:  VITALS per patient if applicable:  GENERAL: alert, oriented, appears well and in no acute distress  HEENT: atraumatic, conjunttiva clear, no obvious abnormalities on inspection of external nose and ears  NECK: normal movements of the head and neck  LUNGS: on inspection no signs of respiratory distress, breathing rate appears normal, no obvious gross SOB, gasping or wheezing  CV: no obvious cyanosis  MS: moves all visible extremities without noticeable abnormality  PSYCH/NEURO: pleasant and cooperative, no obvious depression or anxiety, speech and thought processing grossly intact  ASSESSMENT AND PLAN:  Discussed the following assessment and plan:  Chronic insomnia.  We discussed sleep hygiene in the past.  He is well aware of factors that can affect sleep.  We agreed to change his trazodone  to 150 mg nightly.  He is tolerating this dosage well.  He has previously taken Ambien  sometimes around 4 AM when he wakes up but frequently has some hangover brain fog when he wakes up in the morning.  We discussed medication with shorter half-life such as Sonata 5 mg to  take as needed for nights when he wakes up early.  We did discuss other potential options such as doxepin and Orexin receptor antagonists but explained that cost may be prohibitive with those.     I discussed the assessment and treatment plan with the patient. The patient was provided an opportunity to ask questions and all were answered. The patient agreed with the plan and demonstrated an understanding of the instructions.   The patient was advised to call back or seek an in-person evaluation if the symptoms worsen or if the condition fails to improve as anticipated.     Glean Lamy, MD

## 2023-12-09 ENCOUNTER — Other Ambulatory Visit: Payer: Self-pay | Admitting: Family Medicine

## 2023-12-21 ENCOUNTER — Encounter: Payer: Self-pay | Admitting: Family Medicine

## 2023-12-22 MED ORDER — DICLOFENAC SODIUM 75 MG PO TBEC: 75.0000 mg | DELAYED_RELEASE_TABLET | Freq: Two times a day (BID) | ORAL | 1 refills | Status: DC

## 2024-01-10 ENCOUNTER — Encounter: Payer: Self-pay | Admitting: Family Medicine

## 2024-01-10 DIAGNOSIS — H9190 Unspecified hearing loss, unspecified ear: Secondary | ICD-10-CM

## 2024-01-17 ENCOUNTER — Encounter: Payer: Self-pay | Admitting: Orthopedic Surgery

## 2024-01-18 NOTE — Telephone Encounter (Signed)
 I spoke with scheduler with [facility] Thaddeus office and she has schedule patient for 05/18/2024 at 8:15. Patient was informed of this via telephone and voiced understanding

## 2024-01-19 ENCOUNTER — Telehealth: Payer: Self-pay | Admitting: Family Medicine

## 2024-01-19 NOTE — Telephone Encounter (Signed)
 Copied from CRM [id number]. Topic: General - Call Back - No Documentation >> Jan 18, 2024  4:59 PM Rea C wrote: Reason for CRM: Duwaine from [facility]- [phone] called and stated that there was a mix up in scheduling the patients appointment due to locations. So, the appointment has been rescheduled to November 7th @ 8:15 for the hearing test and meet with Dr. Sherill.

## 2024-01-20 ENCOUNTER — Other Ambulatory Visit: Payer: Self-pay | Admitting: Orthopedic Surgery

## 2024-01-20 DIAGNOSIS — M545 Low back pain, unspecified: Secondary | ICD-10-CM

## 2024-01-23 ENCOUNTER — Other Ambulatory Visit: Payer: Self-pay | Admitting: Family Medicine

## 2024-01-23 ENCOUNTER — Encounter: Payer: Self-pay | Admitting: Family Medicine

## 2024-01-24 ENCOUNTER — Other Ambulatory Visit

## 2024-01-25 MED ORDER — LOVASTATIN 20 MG PO TABS: ORAL_TABLET | ORAL | 1 refills | Status: DC

## 2024-01-25 NOTE — Discharge Instructions (Signed)

## 2024-01-26 ENCOUNTER — Inpatient Hospital Stay
Admission: RE | Admit: 2024-01-26 | Discharge: 2024-01-26 | Disposition: A | Discharge status: Home / Self Care | Source: Ambulatory Visit | Attending: Orthopedic Surgery | Admitting: Orthopedic Surgery

## 2024-01-28 NOTE — Discharge Instructions (Addendum)

## 2024-01-31 ENCOUNTER — Inpatient Hospital Stay
Admission: RE | Admit: 2024-01-31 | Discharge: 2024-01-31 | Disposition: A | Discharge status: Home / Self Care | Source: Ambulatory Visit | Attending: Orthopedic Surgery | Admitting: Orthopedic Surgery

## 2024-02-02 NOTE — Discharge Instructions (Signed)

## 2024-02-03 ENCOUNTER — Ambulatory Visit
Admission: RE | Admit: 2024-02-03 | Discharge: 2024-02-03 | Disposition: A | Discharge status: Home / Self Care | Source: Ambulatory Visit | Attending: Orthopedic Surgery | Admitting: Orthopedic Surgery

## 2024-02-03 DIAGNOSIS — M545 Low back pain, unspecified: Secondary | ICD-10-CM

## 2024-02-03 MED ORDER — METHYLPREDNISOLONE ACETATE 40 MG/ML INJ SUSP (RADIOLOG
80.0000 mg | Freq: Once | INTRAMUSCULAR | Status: AC
  Administered 2024-02-03: 80 mg via EPIDURAL

## 2024-02-03 MED ORDER — IOPAMIDOL (ISOVUE-M 200) INJECTION 41%
1.0000 mL | Freq: Once | INTRAMUSCULAR | Status: AC
  Administered 2024-02-03: 1 mL via EPIDURAL

## 2024-02-18 ENCOUNTER — Encounter: Payer: Self-pay | Admitting: Family Medicine

## 2024-02-21 MED ORDER — TRAZODONE HCL 150 MG PO TABS: 150.0000 mg | ORAL_TABLET | Freq: Every day | ORAL | 3 refills | Status: AC

## 2024-02-22 ENCOUNTER — Other Ambulatory Visit: Payer: Self-pay | Admitting: Orthopedic Surgery

## 2024-02-26 ENCOUNTER — Other Ambulatory Visit: Payer: Self-pay | Admitting: Family Medicine

## 2024-03-09 NOTE — Pre-Procedure Instructions (Signed)
 Surgical Instructions   Your procedure is scheduled on March 16, 2024. Report to [facility] Main Entrance A at 8:00 A.M., then check in with the Admitting office. Any questions or running late day of surgery: call [phone]  Questions prior to your surgery date: call [phone], Monday-Friday, 8am-4pm. If you experience any cold or flu symptoms such as cough, fever, chills, shortness of breath, etc. between now and your scheduled surgery, please notify us  at the above number.     Remember:  Do not eat after midnight the night before your surgery  You may drink clear liquids until 8:00 AM the morning of your surgery.   Clear liquids allowed are: Water, Non-Citrus Juices (without pulp), Carbonated Beverages, Clear Tea (no milk, honey, etc.), Black Coffee Only (NO MILK, CREAM OR POWDERED CREAMER of any kind), and Gatorade.  Patient Instructions  The night before surgery:  No food after midnight. ONLY clear liquids after midnight   The day of surgery:  Drink ONE (1) Pre-Surgery Clear Ensure by 8:00 AM the morning of surgery. Drink in one sitting. Do not sip.  This drink was given to you during your hospital  pre-op appointment visit.  Nothing else to drink after completing the  Pre-Surgery Clear Ensure.         If you have questions, please contact your surgeon's office.   Take these medicines the morning of surgery with A SIP OF WATER: NONE   May take these medicines IF NEEDED: acetaminophen  (TYLENOL )  EPINEPHrine  Pen methocarbamol  (ROBAXIN )  traMADol (ULTRAM)    One week prior to surgery, STOP taking any Aspirin (unless otherwise instructed by your surgeon) Aleve, Naproxen, Ibuprofen , Motrin , Advil , Goody's, BC's, all herbal medications, fish oil, and non-prescription vitamins. This includes your medication: diclofenac  (VOLTAREN ) tablet                      Do NOT Smoke (Tobacco/Vaping) for 24 hours prior to your procedure.  If you use a CPAP at night, you may  bring your mask/headgear for your overnight stay.   You will be asked to remove any contacts, glasses, piercing's, hearing aid's, dentures/partials prior to surgery. Please bring cases for these items if needed.    Patients discharged the day of surgery will not be allowed to drive home, and someone needs to stay with them for 24 hours.  SURGICAL WAITING ROOM VISITATION Patients may have no more than 2 support people in the waiting area - these visitors may rotate.   Pre-op nurse will coordinate an appropriate time for 1 ADULT support person, who may not rotate, to accompany patient in pre-op.  Children under the age of 31 must have an adult with them who is not the patient and must remain in the main waiting area with an adult.  If the patient needs to stay at the hospital during part of their recovery, the visitor guidelines for inpatient rooms apply.  Please refer to the [facility] website for the visitor guidelines for any additional information.   If you received a COVID test during your pre-op visit  it is requested that you wear a mask when out in public, stay away from anyone that may not be feeling well and notify your surgeon if you develop symptoms. If you have been in contact with anyone that has tested positive in the last 10 days please notify you surgeon.      Pre-operative 5 CHG Bathing Instructions   You can play a key  role in reducing the risk of infection after surgery. Your skin needs to be as free of germs as possible. You can reduce the number of germs on your skin by washing with CHG (chlorhexidine  gluconate) soap before surgery. CHG is an antiseptic soap that kills germs and continues to kill germs even after washing.   DO NOT use if you have an allergy to chlorhexidine /CHG or antibacterial soaps. If your skin becomes reddened or irritated, stop using the CHG and notify one of our RNs at [phone].   Please shower with the CHG soap starting 4 days before surgery  using the following schedule:     Please keep in mind the following:  DO NOT shave, including legs and underarms, starting the day of your first shower.   You may shave your face at any point before/day of surgery.  Place clean sheets on your bed the day you start using CHG soap. Use a clean washcloth (not used since being washed) for each shower. DO NOT sleep with pets once you start using the CHG.   CHG Shower Instructions:  Wash your face and private area with normal soap. If you choose to wash your hair, wash first with your normal shampoo.  After you use shampoo/soap, rinse your hair and body thoroughly to remove shampoo/soap residue.  Turn the water OFF and apply about 3 tablespoons (45 ml) of CHG soap to a CLEAN washcloth.  Apply CHG soap ONLY FROM YOUR NECK DOWN TO YOUR TOES (washing for 3-5 minutes)  DO NOT use CHG soap on face, private areas, open wounds, or sores.  Pay special attention to the area where your surgery is being performed.  If you are having back surgery, having someone wash your back for you may be helpful. Wait 2 minutes after CHG soap is applied, then you may rinse off the CHG soap.  Pat dry with a clean towel  Put on clean clothes/pajamas   If you choose to wear lotion, please use ONLY the CHG-compatible lotions that are listed below.  Additional instructions for the day of surgery: DO NOT APPLY any lotions, deodorants, cologne, or perfumes.   Do not bring valuables to the hospital. [facility] is not responsible for any belongings/valuables. Do not wear nail polish, gel polish, artificial nails, or any other type of covering on natural nails (fingers and toes) Do not wear jewelry or makeup Put on clean/comfortable clothes.  Please brush your teeth.  Ask your nurse before applying any prescription medications to the skin.     CHG Compatible Lotions   Aveeno Moisturizing lotion  Cetaphil Moisturizing Cream  Cetaphil Moisturizing Lotion  Clairol  Herbal Essence Moisturizing Lotion, Dry Skin  Clairol Herbal Essence Moisturizing Lotion, Extra Dry Skin  Clairol Herbal Essence Moisturizing Lotion, Normal Skin  Curel Age Defying Therapeutic Moisturizing Lotion with Alpha Hydroxy  Curel Extreme Care Body Lotion  Curel Soothing Hands Moisturizing Hand Lotion  Curel Therapeutic Moisturizing Cream, Fragrance-Free  Curel Therapeutic Moisturizing Lotion, Fragrance-Free  Curel Therapeutic Moisturizing Lotion, Original Formula  Eucerin Daily Replenishing Lotion  Eucerin Dry Skin Therapy Plus Alpha Hydroxy Crme  Eucerin Dry Skin Therapy Plus Alpha Hydroxy Lotion  Eucerin Original Crme  Eucerin Original Lotion  Eucerin Plus Crme Eucerin Plus Lotion  Eucerin TriLipid Replenishing Lotion  Keri Anti-Bacterial Hand Lotion  Keri Deep Conditioning Original Lotion Dry Skin Formula Softly Scented  Keri Deep Conditioning Original Lotion, Fragrance Free Sensitive Skin Formula  Keri Lotion Fast Absorbing Fragrance Free Sensitive Skin Formula  Keri Lotion Fast Absorbing Softly Scented Dry Skin Formula  Keri Original Lotion  Keri Skin Renewal Lotion Keri Silky Smooth Lotion  Keri Silky Smooth Sensitive Skin Lotion  Nivea Body Creamy Conditioning Patent examiner Moisturizing Lotion Nivea Crme  Nivea Skin Firming Lotion  NutraDerm 30 Skin Lotion  NutraDerm Skin Lotion  NutraDerm Therapeutic Skin Cream  NutraDerm Therapeutic Skin Lotion  ProShield Protective Hand Cream  Provon moisturizing lotion  Please read over the following fact sheets that you were given.

## 2024-03-10 ENCOUNTER — Other Ambulatory Visit: Payer: Self-pay

## 2024-03-10 ENCOUNTER — Encounter (HOSPITAL_COMMUNITY): Payer: Self-pay

## 2024-03-10 ENCOUNTER — Encounter (HOSPITAL_COMMUNITY)
Admission: RE | Admit: 2024-03-10 | Discharge: 2024-03-10 | Disposition: A | Discharge status: Home / Self Care | Source: Ambulatory Visit | Attending: Orthopedic Surgery | Admitting: Orthopedic Surgery

## 2024-03-10 VITALS — BP 136/77 | HR 71 | Temp 98.6°F | Resp 16 | Ht 70.0 in | Wt 176.0 lb

## 2024-03-10 DIAGNOSIS — Z01812 Encounter for preprocedural laboratory examination: Secondary | ICD-10-CM | POA: Insufficient documentation

## 2024-03-10 DIAGNOSIS — Z01818 Encounter for other preprocedural examination: Secondary | ICD-10-CM

## 2024-03-10 LAB — CBC
HCT: 47 % (ref 39.0–52.0)
Hemoglobin: 15.8 g/dL (ref 13.0–17.0)
MCH: 32.4 pg (ref 26.0–34.0)
MCHC: 33.6 g/dL (ref 30.0–36.0)
MCV: 96.3 fL (ref 80.0–100.0)
Platelets: 215 K/uL (ref 150–400)
RBC: 4.88 MIL/uL (ref 4.22–5.81)
RDW: 12.6 % (ref 11.5–15.5)
WBC: 7.7 K/uL (ref 4.0–10.5)
nRBC: 0 % (ref 0.0–0.2)

## 2024-03-10 LAB — COMPREHENSIVE METABOLIC PANEL WITH GFR
ALT: 40 U/L (ref 0–44)
AST: 36 U/L (ref 15–41)
Albumin: 3.4 g/dL — ABNORMAL LOW (ref 3.5–5.0)
Alkaline Phosphatase: 64 U/L (ref 38–126)
Anion gap: 7 (ref 5–15)
BUN: 22 mg/dL (ref 8–23)
CO2: 26 mmol/L (ref 22–32)
Calcium: 8.9 mg/dL (ref 8.9–10.3)
Chloride: 105 mmol/L (ref 98–111)
Creatinine, Ser: 1.16 mg/dL (ref 0.61–1.24)
GFR, Estimated: >60 mL/min (ref >60)
Glucose, Bld: 141 mg/dL — ABNORMAL HIGH (ref 70–99)
Potassium: 4.3 mmol/L (ref 3.5–5.1)
Sodium: 138 mmol/L (ref 135–145)
Total Bilirubin: 0.6 mg/dL (ref 0.0–1.2)
Total Protein: 6.3 g/dL — ABNORMAL LOW (ref 6.5–8.1)

## 2024-03-10 LAB — SURGICAL PCR SCREEN
MRSA, PCR: NEGATIVE
Staphylococcus aureus: NEGATIVE

## 2024-03-10 LAB — TYPE AND SCREEN
ABO/RH(D): O POS
Antibody Screen: NEGATIVE

## 2024-03-10 NOTE — Progress Notes (Signed)
 PCP - Dr. Wolm Scarlet Cardiologist - Pt saw Dr. Debby Sor in the office on 02/27/2020 with PRN follow-up  PPM/ICD - Denies Device Orders - n/a Rep Notified - n/a  Chest x-ray - n/a EKG - 10/14/2023 Stress Test - 04/10/2016 ECHO - 04/15/2016 Cardiac Cath - Denies  Sleep Study - Denies CPAP - n/a  No DM  Last dose of GLP1 agonist- n/a GLP1 instructions: n/a  Blood Thinner Instructions: n/a Aspirin Instructions: n/a  ERAS Protcol - Clear liquids until 0800 morning of surgery PRE-SURGERY Ensure or G2- Ensure given to pt with instructions  COVID TEST- n/a   Anesthesia review: No  Patient denies shortness of breath, fever, cough and chest pain at PAT appointment. Pt denies any respiratory illness/infection in the last two months.   All instructions explained to the patient, with a verbal understanding of the material. Patient agrees to go over the instructions while at home for a better understanding. Patient also instructed to self quarantine after being tested for COVID-19. The opportunity to ask questions was provided.

## 2024-03-16 ENCOUNTER — Encounter (HOSPITAL_COMMUNITY): Payer: Self-pay | Admitting: Orthopedic Surgery

## 2024-03-16 ENCOUNTER — Ambulatory Visit (HOSPITAL_COMMUNITY)

## 2024-03-16 ENCOUNTER — Ambulatory Visit (HOSPITAL_COMMUNITY): Payer: Self-pay | Admitting: Anesthesiology

## 2024-03-16 ENCOUNTER — Ambulatory Visit (HOSPITAL_COMMUNITY)
Admission: RE | Disposition: A | Discharge status: Home / Self Care | Payer: Self-pay | Source: Home / Self Care | Attending: Orthopedic Surgery

## 2024-03-16 ENCOUNTER — Observation Stay (HOSPITAL_COMMUNITY)
Admission: RE | Admit: 2024-03-16 | Discharge: 2024-03-17 | Disposition: A | Discharge status: Home / Self Care | Attending: Orthopedic Surgery | Admitting: Orthopedic Surgery

## 2024-03-16 ENCOUNTER — Other Ambulatory Visit: Payer: Self-pay

## 2024-03-16 DIAGNOSIS — M79669 Pain in unspecified lower leg: Secondary | ICD-10-CM | POA: Diagnosis present

## 2024-03-16 DIAGNOSIS — M4316 Spondylolisthesis, lumbar region: Secondary | ICD-10-CM | POA: Insufficient documentation

## 2024-03-16 DIAGNOSIS — M48062 Spinal stenosis, lumbar region with neurogenic claudication: Secondary | ICD-10-CM | POA: Diagnosis not present

## 2024-03-16 DIAGNOSIS — R29818 Other symptoms and signs involving the nervous system: Principal | ICD-10-CM | POA: Diagnosis present

## 2024-03-16 HISTORY — PX: TRANSFORAMINAL LUMBAR INTERBODY FUSION (TLIF) WITH PEDICLE SCREW FIXATION 1 LEVEL: SHX6141

## 2024-03-16 LAB — ABO/RH: ABO/RH(D): O POS

## 2024-03-16 SURGERY — TRANSFORAMINAL LUMBAR INTERBODY FUSION (TLIF) WITH PEDICLE SCREW FIXATION 1 LEVEL
Anesthesia: General | Laterality: Left

## 2024-03-16 MED ORDER — EPHEDRINE 5 MG/ML INJ
INTRAVENOUS | Status: AC
  Filled 2024-03-16: qty 5

## 2024-03-16 MED ORDER — ONDANSETRON HCL 4 MG/2ML IJ SOLN: 4.0000 mg | Freq: Once | INTRAMUSCULAR | Status: DC | PRN

## 2024-03-16 MED ORDER — TRAZODONE HCL 150 MG PO TABS
150.0000 mg | ORAL_TABLET | Freq: Every day | ORAL | Status: DC
  Administered 2024-03-16: 150 mg via ORAL
  Filled 2024-03-16: qty 1

## 2024-03-16 MED ORDER — FINASTERIDE 1 MG PO TABS: 1.0000 mg | ORAL_TABLET | Freq: Every evening | ORAL | Status: DC

## 2024-03-16 MED ORDER — KETAMINE HCL 50 MG/5ML IJ SOSY
PREFILLED_SYRINGE | INTRAMUSCULAR | Status: AC
  Filled 2024-03-16: qty 5

## 2024-03-16 MED ORDER — LIDOCAINE 2% (20 MG/ML) 5 ML SYRINGE
INTRAMUSCULAR | Status: AC
  Filled 2024-03-16: qty 5

## 2024-03-16 MED ORDER — MIDAZOLAM HCL 2 MG/2ML IJ SOLN
INTRAMUSCULAR | Status: AC
Start: 2024-03-16 — End: 2024-03-16
  Filled 2024-03-16: qty 2

## 2024-03-16 MED ORDER — FENTANYL CITRATE (PF) 250 MCG/5ML IJ SOLN
INTRAMUSCULAR | Status: AC
  Filled 2024-03-16: qty 5

## 2024-03-16 MED ORDER — MAGNESIUM OXIDE -MG SUPPLEMENT 400 (240 MG) MG PO TABS
200.0000 mg | ORAL_TABLET | Freq: Two times a day (BID) | ORAL | Status: DC
  Administered 2024-03-16 – 2024-03-17 (×2): 200 mg via ORAL
  Filled 2024-03-16 (×2): qty 1

## 2024-03-16 MED ORDER — SENNOSIDES-DOCUSATE SODIUM 8.6-50 MG PO TABS: 1.0000 | ORAL_TABLET | Freq: Every evening | ORAL | Status: DC | PRN

## 2024-03-16 MED ORDER — FENTANYL CITRATE (PF) 250 MCG/5ML IJ SOLN
INTRAMUSCULAR | Status: DC | PRN
  Administered 2024-03-16 (×2): 50 ug via INTRAVENOUS

## 2024-03-16 MED ORDER — AMISULPRIDE (ANTIEMETIC) 5 MG/2ML IV SOLN: 10.0000 mg | Freq: Once | INTRAVENOUS | Status: DC | PRN

## 2024-03-16 MED ORDER — OXYCODONE-ACETAMINOPHEN 5-325 MG PO TABS
1.0000 | ORAL_TABLET | ORAL | Status: DC | PRN
  Administered 2024-03-16: 2 via ORAL
  Administered 2024-03-16: 1 via ORAL
  Administered 2024-03-16: 2 via ORAL
  Administered 2024-03-17 (×2): 1 via ORAL
  Filled 2024-03-16 (×3): qty 1
  Filled 2024-03-16 (×2): qty 2

## 2024-03-16 MED ORDER — FENTANYL CITRATE (PF) 100 MCG/2ML IJ SOLN
INTRAMUSCULAR | Status: AC
Start: 2024-03-16 — End: 2024-03-16
  Filled 2024-03-16: qty 2

## 2024-03-16 MED ORDER — BUPIVACAINE-EPINEPHRINE (PF) 0.25% -1:200000 IJ SOLN
INTRAMUSCULAR | Status: AC
  Filled 2024-03-16: qty 30

## 2024-03-16 MED ORDER — OXYCODONE-ACETAMINOPHEN 5-325 MG PO TABS
ORAL_TABLET | ORAL | Status: AC
  Filled 2024-03-16: qty 2

## 2024-03-16 MED ORDER — CEFAZOLIN SODIUM-DEXTROSE 2-4 GM/100ML-% IV SOLN
2.0000 g | INTRAVENOUS | Status: AC
  Administered 2024-03-16: 2 g via INTRAVENOUS
  Filled 2024-03-16: qty 100

## 2024-03-16 MED ORDER — SCOPOLAMINE 1 MG/3DAYS TD PT72: 1.0000 | MEDICATED_PATCH | Freq: Once | TRANSDERMAL | Status: DC

## 2024-03-16 MED ORDER — PHENYLEPHRINE HCL-NACL 20-0.9 MG/250ML-% IV SOLN
INTRAVENOUS | Status: DC | PRN
  Administered 2024-03-16: 25 ug/min via INTRAVENOUS

## 2024-03-16 MED ORDER — LACTATED RINGERS IV SOLN: INTRAVENOUS | Status: DC

## 2024-03-16 MED ORDER — PHENYLEPHRINE 80 MCG/ML (10ML) SYRINGE FOR IV PUSH (FOR BLOOD PRESSURE SUPPORT)
PREFILLED_SYRINGE | INTRAVENOUS | Status: DC | PRN
  Administered 2024-03-16 (×2): 80 ug via INTRAVENOUS

## 2024-03-16 MED ORDER — CHLORHEXIDINE GLUCONATE 0.12 % MT SOLN
15.0000 mL | Freq: Once | OROMUCOSAL | Status: AC
  Administered 2024-03-16: 15 mL via OROMUCOSAL
  Filled 2024-03-16: qty 15

## 2024-03-16 MED ORDER — POVIDONE-IODINE 7.5 % EX SOLN
Freq: Once | CUTANEOUS | Status: AC
  Administered 2024-03-16: 1 via TOPICAL
  Filled 2024-03-16: qty 118

## 2024-03-16 MED ORDER — THROMBIN 20000 UNITS EX SOLR
CUTANEOUS | Status: AC
  Filled 2024-03-16: qty 20000

## 2024-03-16 MED ORDER — BUPIVACAINE-EPINEPHRINE 0.25% -1:200000 IJ SOLN
INTRAMUSCULAR | Status: DC | PRN
  Administered 2024-03-16: 40 mL

## 2024-03-16 MED ORDER — SODIUM CHLORIDE 0.9 % IV SOLN: 250.0000 mL | INTRAVENOUS | Status: DC

## 2024-03-16 MED ORDER — MIDAZOLAM HCL 2 MG/2ML IJ SOLN
INTRAMUSCULAR | Status: DC | PRN
  Administered 2024-03-16: 2 mg via INTRAVENOUS

## 2024-03-16 MED ORDER — ALUM & MAG HYDROXIDE-SIMETH 200-200-20 MG/5ML PO SUSP: 30.0000 mL | Freq: Four times a day (QID) | ORAL | Status: DC | PRN

## 2024-03-16 MED ORDER — ONDANSETRON HCL 4 MG/2ML IJ SOLN
INTRAMUSCULAR | Status: AC
  Filled 2024-03-16: qty 2

## 2024-03-16 MED ORDER — ONDANSETRON HCL 4 MG/2ML IJ SOLN: 4.0000 mg | Freq: Four times a day (QID) | INTRAMUSCULAR | Status: DC | PRN

## 2024-03-16 MED ORDER — LORATADINE 10 MG PO TABS
10.0000 mg | ORAL_TABLET | Freq: Every day | ORAL | Status: DC
  Administered 2024-03-16 – 2024-03-17 (×2): 10 mg via ORAL
  Filled 2024-03-16 (×2): qty 1

## 2024-03-16 MED ORDER — PROPOFOL 10 MG/ML IV BOLUS
INTRAVENOUS | Status: DC | PRN
  Administered 2024-03-16: 50 mg via INTRAVENOUS
  Administered 2024-03-16: 130 mg via INTRAVENOUS

## 2024-03-16 MED ORDER — BISACODYL 5 MG PO TBEC: 5.0000 mg | DELAYED_RELEASE_TABLET | Freq: Every day | ORAL | Status: DC | PRN

## 2024-03-16 MED ORDER — ACETAMINOPHEN 500 MG PO TABS
1000.0000 mg | ORAL_TABLET | Freq: Once | ORAL | Status: AC
  Administered 2024-03-16: 1000 mg via ORAL
  Filled 2024-03-16: qty 2

## 2024-03-16 MED ORDER — PHENYLEPHRINE 80 MCG/ML (10ML) SYRINGE FOR IV PUSH (FOR BLOOD PRESSURE SUPPORT)
PREFILLED_SYRINGE | INTRAVENOUS | Status: AC
  Filled 2024-03-16: qty 10

## 2024-03-16 MED ORDER — MORPHINE SULFATE (PF) 2 MG/ML IV SOLN: 2.0000 mg | INTRAVENOUS | Status: DC | PRN

## 2024-03-16 MED ORDER — ACETAMINOPHEN 650 MG RE SUPP: 650.0000 mg | RECTAL | Status: DC | PRN

## 2024-03-16 MED ORDER — ONDANSETRON HCL 4 MG/2ML IJ SOLN
INTRAMUSCULAR | Status: DC | PRN
  Administered 2024-03-16: 4 mg via INTRAVENOUS

## 2024-03-16 MED ORDER — PHENYLEPHRINE HCL (PRESSORS) 10 MG/ML IV SOLN
INTRAVENOUS | Status: AC
  Filled 2024-03-16: qty 1

## 2024-03-16 MED ORDER — 0.9 % SODIUM CHLORIDE (POUR BTL) OPTIME
TOPICAL | Status: DC | PRN
  Administered 2024-03-16 (×2): 1000 mL

## 2024-03-16 MED ORDER — POTASSIUM CHLORIDE IN NACL 20-0.9 MEQ/L-% IV SOLN
INTRAVENOUS | Status: DC
  Filled 2024-03-16: qty 1000

## 2024-03-16 MED ORDER — KETAMINE HCL 10 MG/ML IJ SOLN
INTRAMUSCULAR | Status: DC | PRN
  Administered 2024-03-16: 20 mg via INTRAVENOUS
  Administered 2024-03-16: 10 mg via INTRAVENOUS

## 2024-03-16 MED ORDER — METHOCARBAMOL 500 MG PO TABS
500.0000 mg | ORAL_TABLET | Freq: Four times a day (QID) | ORAL | Status: DC | PRN
  Administered 2024-03-16: 1000 mg via ORAL
  Administered 2024-03-16 – 2024-03-17 (×2): 500 mg via ORAL
  Filled 2024-03-16 (×4): qty 1

## 2024-03-16 MED ORDER — PHENOL 1.4 % MT LIQD: 1.0000 | OROMUCOSAL | Status: DC | PRN

## 2024-03-16 MED ORDER — MAGNESIUM 200 MG PO TABS: ORAL_TABLET | Freq: Two times a day (BID) | ORAL | Status: DC

## 2024-03-16 MED ORDER — EPINEPHRINE 0.3 MG/0.3ML IJ SOAJ: 0.3000 mg | INTRAMUSCULAR | Status: DC | PRN

## 2024-03-16 MED ORDER — METHOCARBAMOL 1000 MG/10ML IJ SOLN: 500.0000 mg | Freq: Four times a day (QID) | INTRAMUSCULAR | Status: DC | PRN

## 2024-03-16 MED ORDER — ACETAMINOPHEN 325 MG PO TABS: 650.0000 mg | ORAL_TABLET | ORAL | Status: DC | PRN

## 2024-03-16 MED ORDER — VITAMIN D 25 MCG (1000 UNIT) PO TABS
1000.0000 [IU] | ORAL_TABLET | Freq: Every day | ORAL | Status: DC
  Administered 2024-03-17: 1000 [IU] via ORAL
  Filled 2024-03-16: qty 1

## 2024-03-16 MED ORDER — THROMBIN 20000 UNITS EX KIT: PACK | CUTANEOUS | Status: DC | PRN

## 2024-03-16 MED ORDER — CEFAZOLIN SODIUM-DEXTROSE 2-4 GM/100ML-% IV SOLN
2.0000 g | Freq: Three times a day (TID) | INTRAVENOUS | Status: AC
  Administered 2024-03-16 – 2024-03-17 (×2): 2 g via INTRAVENOUS
  Filled 2024-03-16 (×2): qty 100

## 2024-03-16 MED ORDER — SUGAMMADEX SODIUM 200 MG/2ML IV SOLN
INTRAVENOUS | Status: DC | PRN
  Administered 2024-03-16: 300 mg via INTRAVENOUS

## 2024-03-16 MED ORDER — ROCURONIUM BROMIDE 10 MG/ML (PF) SYRINGE
PREFILLED_SYRINGE | INTRAVENOUS | Status: AC
  Filled 2024-03-16: qty 10

## 2024-03-16 MED ORDER — ADULT MULTIVITAMIN W/MINERALS CH
1.0000 | ORAL_TABLET | Freq: Every morning | ORAL | Status: DC
  Administered 2024-03-17: 1 via ORAL
  Filled 2024-03-16: qty 1

## 2024-03-16 MED ORDER — SODIUM CHLORIDE 0.9% FLUSH
3.0000 mL | Freq: Two times a day (BID) | INTRAVENOUS | Status: DC
  Administered 2024-03-16 (×2): 3 mL via INTRAVENOUS

## 2024-03-16 MED ORDER — SODIUM CHLORIDE 0.9% FLUSH: 3.0000 mL | INTRAVENOUS | Status: DC | PRN

## 2024-03-16 MED ORDER — PROPOFOL 10 MG/ML IV BOLUS
INTRAVENOUS | Status: AC
  Filled 2024-03-16: qty 20

## 2024-03-16 MED ORDER — PRAVASTATIN SODIUM 10 MG PO TABS
20.0000 mg | ORAL_TABLET | Freq: Every day | ORAL | Status: DC
  Administered 2024-03-16: 20 mg via ORAL
  Filled 2024-03-16: qty 2

## 2024-03-16 MED ORDER — BUPIVACAINE LIPOSOME 1.3 % IJ SUSP
INTRAMUSCULAR | Status: AC
  Filled 2024-03-16: qty 20

## 2024-03-16 MED ORDER — ONDANSETRON HCL 4 MG PO TABS: 4.0000 mg | ORAL_TABLET | Freq: Four times a day (QID) | ORAL | Status: DC | PRN

## 2024-03-16 MED ORDER — ZOLPIDEM TARTRATE 5 MG PO TABS: 5.0000 mg | ORAL_TABLET | Freq: Every evening | ORAL | Status: DC | PRN

## 2024-03-16 MED ORDER — VITAMIN D-3 125 MCG (5000 UT) PO TABS: ORAL_TABLET | Freq: Every morning | ORAL | Status: DC

## 2024-03-16 MED ORDER — SCOPOLAMINE 1 MG/3DAYS TD PT72SCOPOLAMINE 1 MG/3DAYS
MEDICATED_PATCH | TRANSDERMAL | Status: DC
Start: 2024-03-16 — End: 2024-03-19
  Administered 2024-03-16: 1 mg via TRANSDERMAL
  Filled 2024-03-16: qty 1

## 2024-03-16 MED ORDER — MENTHOL 3 MG MT LOZG: 1.0000 | LOZENGE | OROMUCOSAL | Status: DC | PRN

## 2024-03-16 MED ORDER — HYDROCODONE-ACETAMINOPHEN 5-325 MG PO TABS: 1.0000 | ORAL_TABLET | ORAL | Status: DC | PRN

## 2024-03-16 MED ORDER — FENTANYL CITRATE (PF) 100 MCG/2ML IJ SOLN
25.0000 ug | INTRAMUSCULAR | Status: DC | PRN
  Administered 2024-03-16 (×3): 25 ug via INTRAVENOUS

## 2024-03-16 MED ORDER — LIDOCAINE 2% (20 MG/ML) 5 ML SYRINGE
INTRAMUSCULAR | Status: DC | PRN
  Administered 2024-03-16: 80 mg via INTRAVENOUS

## 2024-03-16 MED ORDER — EPHEDRINE SULFATE-NACL 50-0.9 MG/10ML-% IV SOSY
PREFILLED_SYRINGE | INTRAVENOUS | Status: DC | PRN
  Administered 2024-03-16: 15 mg via INTRAVENOUS
  Administered 2024-03-16 (×2): 5 mg via INTRAVENOUS

## 2024-03-16 MED ORDER — ROCURONIUM BROMIDE 10 MG/ML (PF) SYRINGE
PREFILLED_SYRINGE | INTRAVENOUS | Status: DC | PRN
  Administered 2024-03-16: 10 mg via INTRAVENOUS
  Administered 2024-03-16: 60 mg via INTRAVENOUS
  Administered 2024-03-16 (×3): 20 mg via INTRAVENOUS

## 2024-03-16 MED ORDER — FLEET ENEMA RE ENEM: 1.0000 | ENEMA | Freq: Once | RECTAL | Status: DC | PRN

## 2024-03-16 MED ORDER — DOCUSATE SODIUM 100 MG PO CAPS
100.0000 mg | ORAL_CAPSULE | Freq: Two times a day (BID) | ORAL | Status: DC
  Administered 2024-03-16 – 2024-03-17 (×2): 100 mg via ORAL
  Filled 2024-03-16 (×2): qty 1

## 2024-03-16 MED ORDER — ORAL CARE MOUTH RINSE: 15.0000 mL | Freq: Once | OROMUCOSAL | Status: AC

## 2024-03-16 MED ORDER — BUPIVACAINE-EPINEPHRINE 0.25% -1:200000 IJ SOLN
INTRAMUSCULAR | Status: DC | PRN
  Administered 2024-03-16: 10 mL

## 2024-03-16 SURGICAL SUPPLY — 77 items
BAG COUNTER SPONGE SURGICOUNT (BAG) ×2 IMPLANT
BENZOIN TINCTURE PRP APPL 2/3 (GAUZE/BANDAGES/DRESSINGS) ×2 IMPLANT
BLADE CLIPPER SURG (BLADE) IMPLANT
BUR PRECISION FLUTE 5.0 (BURR) ×2 IMPLANT
BUR PRESCISION 1.7 ELITE (BURR) ×2 IMPLANT
BUR ROUND FLUTED 5 RND (BURR) ×2 IMPLANT
BUR ROUND PRECISION 4.0 (BURR) IMPLANT
BUR SABER RD CUTTING 3.0 (BURR) IMPLANT
CAGE SABLE 10X22 6-12 0D (Cage) IMPLANT
CANNULA GRAFT BNE VG PRE-FILL (Bone Implant) IMPLANT
CNTNR URN SCR LID CUP LEK RST (MISCELLANEOUS) ×2 IMPLANT
COVER BACK TABLE 60X90IN (DRAPES) ×2 IMPLANT
COVER MAYO STAND STRL (DRAPES) ×4 IMPLANT
COVER SURGICAL LIGHT HANDLE (MISCELLANEOUS) ×2 IMPLANT
DISPENSER GRAFT BNE VG (MISCELLANEOUS) IMPLANT
DRAPE C-ARM 42X72 X-RAY (DRAPES) ×2 IMPLANT
DRAPE C-ARMOR (DRAPES) IMPLANT
DRAPE POUCH INSTRU U-SHP 10X18 (DRAPES) ×2 IMPLANT
DRAPE SURG 17X23 STRL (DRAPES) ×8 IMPLANT
DURAPREP 26ML APPLICATOR (WOUND CARE) ×2 IMPLANT
ELECT CAUTERY BLADE 6.4 (BLADE) ×2 IMPLANT
ELECTRODE BLDE 4.0 EZ CLN MEGD (MISCELLANEOUS) ×2 IMPLANT
ELECTRODE REM PT RTRN 9FT ADLT (ELECTROSURGICAL) ×2 IMPLANT
EVACUATOR SILICONE 100CC (DRAIN) IMPLANT
FILTER STRAW FLUID ASPIR (MISCELLANEOUS) ×2 IMPLANT
GAUZE 4X4 16PLY ~~LOC~~+RFID DBL (SPONGE) ×2 IMPLANT
GAUZE SPONGE 4X4 12PLY STRL (GAUZE/BANDAGES/DRESSINGS) ×2 IMPLANT
GLOVE BIO SURGEON STRL SZ 6.5 (GLOVE) ×2 IMPLANT
GLOVE BIO SURGEON STRL SZ8 (GLOVE) ×2 IMPLANT
GLOVE BIOGEL PI IND STRL 7.0 (GLOVE) ×2 IMPLANT
GLOVE BIOGEL PI IND STRL 8 (GLOVE) ×2 IMPLANT
GLOVE SURG ENC MOIS LTX SZ6.5 (GLOVE) ×2 IMPLANT
GOWN STRL REUS W/ TWL LRG LVL3 (GOWN DISPOSABLE) ×4 IMPLANT
GOWN STRL REUS W/ TWL XL LVL3 (GOWN DISPOSABLE) ×2 IMPLANT
IV CATH 14GX2 1/4 (CATHETERS) ×2 IMPLANT
KIT BASIN OR (CUSTOM PROCEDURE TRAY) ×2 IMPLANT
KIT POSITIONER JACKSON TABLE (MISCELLANEOUS) ×2 IMPLANT
KIT TURNOVER KIT B (KITS) ×2 IMPLANT
MARKER SKIN DUAL TIP RULER LAB (MISCELLANEOUS) ×4 IMPLANT
NDL 18GX1X1/2 (RX/OR ONLY) (NEEDLE) ×2 IMPLANT
NDL 22X1.5 STRL (OR ONLY) (MISCELLANEOUS) ×4 IMPLANT
NDL HYPO 25GX1X1/2 BEV (NEEDLE) ×2 IMPLANT
NDL SPNL 18GX3.5 QUINCKE PK (NEEDLE) ×4 IMPLANT
NEEDLE 18GX1X1/2 (RX/OR ONLY) (NEEDLE) ×1 IMPLANT
NEEDLE 22X1.5 STRL (OR ONLY) (MISCELLANEOUS) ×2 IMPLANT
NEEDLE HYPO 25GX1X1/2 BEV (NEEDLE) ×1 IMPLANT
NEEDLE SPNL 18GX3.5 QUINCKE PK (NEEDLE) ×2 IMPLANT
NS IRRIG 1000ML POUR BTL (IV SOLUTION) ×2 IMPLANT
PACK LAMINECTOMY ORTHO (CUSTOM PROCEDURE TRAY) ×2 IMPLANT
PACK UNIVERSAL I (CUSTOM PROCEDURE TRAY) ×2 IMPLANT
PAD ARMBOARD POSITIONER FOAM (MISCELLANEOUS) ×4 IMPLANT
PATTIES SURGICAL .5 X.5 (GAUZE/BANDAGES/DRESSINGS) ×2 IMPLANT
PATTIES SURGICAL .5 X1 (DISPOSABLE) IMPLANT
PATTIES SURGICAL .5X1.5 (GAUZE/BANDAGES/DRESSINGS) IMPLANT
PUTTY DBX 2.5CC DEPUY (Putty) IMPLANT
ROD PRE BENT EXP 40MM (Rod) IMPLANT
SCREW SET SINGLE INNER (Screw) IMPLANT
SCREW VIPER CORT FIX 6.00X30 (Screw) IMPLANT
SPONGE INTESTINAL PEANUT (DISPOSABLE) ×2 IMPLANT
SPONGE SURGIFOAM ABS GEL 100 (HEMOSTASIS) ×2 IMPLANT
STRIP CLOSURE SKIN 1/2X4 (GAUZE/BANDAGES/DRESSINGS) ×4 IMPLANT
SURGIFLO W/THROMBIN 8M KIT (HEMOSTASIS) IMPLANT
SUT MNCRL AB 4-0 PS2 18 (SUTURE) ×2 IMPLANT
SUT VIC AB 0 CT1 18XCR BRD 8 (SUTURE) ×2 IMPLANT
SUT VIC AB 1 CT1 18XCR BRD 8 (SUTURE) ×2 IMPLANT
SUT VIC AB 2-0 CT2 18 VCP726D (SUTURE) ×2 IMPLANT
SYR 20ML LL LF (SYRINGE) ×4 IMPLANT
SYR BULB IRRIG 60ML STRL (SYRINGE) ×2 IMPLANT
SYR CONTROL 10ML LL (SYRINGE) ×4 IMPLANT
SYR TB 1ML LUER SLIP (SYRINGE) ×2 IMPLANT
TAP EXPEDIUM DL 4.35 (INSTRUMENTS) IMPLANT
TAP EXPEDIUM DL 5.0 (INSTRUMENTS) IMPLANT
TAP EXPEDIUM DL 6.0 (INSTRUMENTS) IMPLANT
TRAY FOLEY MTR SLVR 16FR STAT (SET/KITS/TRAYS/PACK) ×2 IMPLANT
TUBE FUNNEL GL DISP (ORTHOPEDIC DISPOSABLE SUPPLIES) IMPLANT
WATER STERILE IRR 1000ML POUR (IV SOLUTION) ×2 IMPLANT
YANKAUER SUCT BULB TIP NO VENT (SUCTIONS) ×2 IMPLANT

## 2024-03-16 NOTE — Anesthesia Procedure Notes (Signed)
 Procedure Name: Intubation Date/Time: 03/16/2024 10:48 AM  Performed by: Dianne Burnard RAMAN, CRNAPre-anesthesia Checklist: Patient identified, Emergency Drugs available, Suction available and Patient being monitored Patient Re-evaluated:Patient Re-evaluated prior to induction Oxygen Delivery Method: Circle system utilized Preoxygenation: Pre-oxygenation with 100% oxygen Induction Type: IV induction Ventilation: Mask ventilation without difficulty Laryngoscope Size: McGrath and 4 Grade View: Grade I Tube type: Oral Tube size: 7.5 mm Number of attempts: 1 Airway Equipment and Method: Stylet and Oral airway Placement Confirmation: ETT inserted through vocal cords under direct vision, positive ETCO2 and breath sounds checked- equal and bilateral Secured at: 22 cm Tube secured with: Tape Dental Injury: Teeth and Oropharynx as per pre-operative assessment

## 2024-03-16 NOTE — H&P (Signed)
 PREOPERATIVE H&P  Chief Complaint: Bilateral leg pain  HPI: Kenneth Knapp is a 63 y.o. male who presents with ongoing pain in the bilateral legs  MRI reveals severe spinal stenosis and instability at L3-4  Patient has failed multiple forms of conservative care and continues to have pain (see office notes for additional details regarding the patient's full course of treatment)  Past Medical History:  Diagnosis Date   AR (allergic rhinitis)    BPH (benign prostatic hyperplasia)    ED (erectile dysfunction)    History of echocardiogram    04-15-2016  mild LVH, ef 60-65%   History of exercise stress test (03-22-2019 per pt had not any cardiac s&s since )   04-10-2016 (cardiology evaluation for precordial pain by dr t. burnard)  Low risk w/ low specificity (mild positive ekg changes at very high workload and brisk return to normal)   Hyperlipidemia    Hypogonadism male    Rotator cuff tear, right    Wears glasses    Past Surgical History:  Procedure Laterality Date   ARTHOSCOPIC ROTAOR CUFF REPAIR Right 03/23/2019   Procedure: ARTHROSCOPIC ROTATOR CUFF REPAIR; SUBACROMIAL DECOMPRESSION; BICEP TENODISIS;  Surgeon: Dozier Soulier, MD;  Location: [facility] ;  Service: Orthopedics;  Laterality: Right;  90 MIN   COLONOSCOPY  2013   COLONOSCOPY WITH ESOPHAGOGASTRODUODENOSCOPY (EGD)  06/09/2021   LUMBAR DISC SURGERY  2002   L4 -- L5   LUMBAR LAMINECTOMY/DECOMPRESSION MICRODISCECTOMY Right 10/14/2023   Procedure: LUMBAR LAMINECTOMY/DECOMPRESSION MICRODISCECTOMY 1 LEVEL;  Surgeon: Beuford Anes, MD;  Location: [facility];  Service: Orthopedics;  Laterality: Right;  RIGHT-SIDED LUMBAR 4 - LUMBAR 5 MICRODISCECTOMY   MASS EXCISION Left 06/08/2017   Procedure: EXCISION CYST BUTTOCK;  Surgeon: Vanderbilt Ned, MD;  Location: [facility];  Service: General;  Laterality: Left;   ROTATOR CUFF REPAIR Left 04/2023   Dr. Dozier   WISDOM TOOTH EXTRACTION      Social History   Socioeconomic History   Marital status: Divorced    Spouse name: Not on file   Number of children: Not on file   Years of education: Not on file   Highest education level: Not on file  Occupational History   Not on file  Tobacco Use   Smoking status: Never   Smokeless tobacco: Never  Vaping Use   Vaping status: Never Used  Substance and Sexual Activity   Alcohol use: Yes    Comment: 1 DRINK PER DAY   Drug use: Never   Sexual activity: Yes  Other Topics Concern   Not on file  Social History Narrative   Not on file   Social Drivers of Health   Financial Resource Strain: Not on file  Food Insecurity: Not on file  Transportation Needs: Not on file  Physical Activity: Not on file  Stress: Not on file  Social Connections: Not on file   Family History  Problem Relation Age of Onset   ALS Mother    Arthritis Mother    Heart disease Father    Stroke Father 36       hemorrhagic   Colon cancer Paternal Grandmother    Heart disease Paternal Grandfather    Stomach cancer Neg Hx    Rectal cancer Neg Hx    Esophageal cancer Neg Hx    No Known Allergies Prior to Admission medications   Medication Sig Start Date End Date Taking? Authorizing Provider  acetaminophen  (TYLENOL ) 500 MG tablet Take 500-1,000  mg by mouth every 6 (six) hours as needed (pain.).   Yes [provider]  Cholecalciferol  (VITAMIN D -3 PO) Take 1 tablet by mouth in the morning.   Yes [provider]  Coenzyme Q10 (CO Q-10 PO) Take 1 capsule by mouth in the morning.   Yes [provider]  Cyanocobalamin (VITAMIN B-12 PO) Take 1 tablet by mouth every evening.   Yes [provider]  diclofenac  (VOLTAREN ) 75 MG EC tablet TAKE 1 TABLET BY MOUTH TWICE A DAY Patient taking differently: Take 75 mg by mouth in the morning. 02/28/24  Yes Burchette, Wolm ORN, MD  EPINEPHrine  0.3 mg/0.3 mL IJ SOAJ injection Inject 0.3 mg into the muscle as needed for anaphylaxis. Use  as needed 03/24/16  Yes [provider]  fexofenadine (ALLEGRA) 180 MG tablet Take 180 mg by mouth at bedtime.   Yes [provider]  finasteride  (PROPECIA ) 1 MG tablet Take 1 mg by mouth every evening. 09/29/23  Yes [provider]  ibuprofen  (ADVIL ) 200 MG tablet Take 200-400 mg by mouth every 8 (eight) hours as needed (pain.).   Yes [provider]  lovastatin  (MEVACOR ) 20 MG tablet TAKE 1 TABLET BY MOUTH EVERYDAY AT BEDTIME Patient taking differently: Take 20 mg by mouth every other day. In the evening. 01/25/24  Yes Burchette, Wolm ORN, MD  MAGNESIUM  PO Take 1 tablet by mouth in the morning and at bedtime.   Yes [provider]  methocarbamol  (ROBAXIN ) 500 MG tablet Take 500 mg by mouth every 6 (six) hours as needed for muscle spasms. 01/26/24  Yes [provider]  Misc Natural Products (MILK THISTLE) CAPS Take 1 capsule by mouth every evening.   Yes [provider]  Multiple Vitamin (MULTIVITAMIN) tablet Take 1 tablet by mouth in the morning.   Yes [provider]  PRESCRIPTION MEDICATION ALLERGY INJECTIONS WEEKLY   Yes [provider]  sildenafil  (VIAGRA ) 50 MG tablet TAKE 1 TABLET BY MOUTH 1 HOUR PRIOR TO SEXUAL ACTIVITY 01/06/23  Yes Burchette, Wolm ORN, MD  testosterone  cypionate (DEPOTESTOSTERONE CYPIONATE) 200 MG/ML injection INJECT 1.5 ML (300 MG) INTO THE MUSCLE EVERY 14 DAYS Patient taking differently: 300 mg See admin instructions. INJECT 1.5 ML (300 MG) INTO THE MUSCLE EVERY 10 DAYS. DISCARD VIAL AFTER USE. 12/13/23  Yes Burchette, Wolm ORN, MD  traZODone  (DESYREL ) 150 MG tablet Take 1 tablet (150 mg total) by mouth at bedtime. 02/21/24  Yes Burchette, Wolm ORN, MD  TURMERIC PO Take 1 capsule by mouth daily.   Yes [provider]  zaleplon  (SONATA ) 5 MG capsule Take 1 capsule (5 mg total) by mouth at bedtime as needed for sleep. 11/24/23  Yes Burchette, Wolm ORN, MD  traMADol (ULTRAM) 50 MG tablet Take 50 mg by  mouth every 6 (six) hours as needed (pain.).    [provider]     All other systems have been reviewed and were otherwise negative with the exception of those mentioned in the HPI and as above.  Physical Exam: Vitals:   03/16/24 0904  BP: 112/69  Pulse: 67  Resp: 18  Temp: 98.2 F (36.8 C)  SpO2: 95%    Body mass index is 24.11 kg/m.  General: Alert, no acute distress Cardiovascular: No pedal edema Respiratory: No cyanosis, no use of accessory musculature Skin: No lesions in the area of chief complaint Neurologic: Sensation intact distally Psychiatric: Patient is competent for consent with normal mood and affect Lymphatic: No axillary or cervical lymphadenopathy  Assessment/Plan: BILATERAL LEG PAIN due to stenosis and instability at L3-4 Plan for Procedure(s): TRANSFORAMINAL LUMBAR INTERBODY FUSION (TLIF) and decompression WITH PEDICLE SCREW FIXATION, L3-4   Oneil LITTIE Priestly, MD 03/16/2024 9:27 AM

## 2024-03-16 NOTE — Anesthesia Preprocedure Evaluation (Addendum)
 Anesthesia Evaluation  Patient identified by MRN, date of birth, ID band Patient awake    Reviewed: Allergy & Precautions, NPO status , Patient's Chart, lab work & pertinent test results  History of Anesthesia Complications (+) PONV and history of anesthetic complications  Airway Mallampati: III  TM Distance: >3 FB Neck ROM: Full    Dental  (+) Teeth Intact, Dental Advisory Given   Pulmonary neg pulmonary ROS   Pulmonary exam normal breath sounds clear to auscultation       Cardiovascular Exercise Tolerance: Good (-) hypertension(-) angina (-) Past MI negative cardio ROS Normal cardiovascular exam Rhythm:Regular Rate:Normal     Neuro/Psych negative neurological ROS  negative psych ROS   GI/Hepatic negative GI ROS, Neg liver ROS,,,  Endo/Other  negative endocrine ROS    Renal/GU negative Renal ROS     Musculoskeletal  (+) Arthritis ,    Abdominal   Peds  Hematology negative hematology ROS (+)   Anesthesia Other Findings Day of surgery medications reviewed with the patient.  Reproductive/Obstetrics                              Anesthesia Physical Anesthesia Plan  ASA: 1  Anesthesia Plan: General   Post-op Pain Management: Tylenol  PO (pre-op)*   Induction: Intravenous  PONV Risk Score and Plan: 2 and Midazolam , Dexamethasone , Ondansetron , Scopolamine  patch - Pre-op and Propofol  infusion  Airway Management Planned: Oral ETT  Additional Equipment:   Intra-op Plan:   Post-operative Plan: Extubation in OR  Informed Consent: I have reviewed the patients History and Physical, chart, labs and discussed the procedure including the risks, benefits and alternatives for the proposed anesthesia with the patient or authorized representative who has indicated his/her understanding and acceptance.     Dental advisory given  Plan Discussed with: CRNA  Anesthesia Plan Comments: (2nd  PIV after induction)         Anesthesia Quick Evaluation

## 2024-03-16 NOTE — Op Note (Signed)
 PATIENT NAME: Kenneth Knapp   MEDICAL RECORD NO.:   [id number]    DATE OF BIRTH: 11-Oct-1960   DATE OF PROCEDURE: 03/16/2024                               OPERATIVE REPORT     PREOPERATIVE DIAGNOSES: 1. Bilateral lumbar radiculopathy and neurogenic claudication 2. L3-4 stenosis 3. L3-4 spondylolisthesis   POSTOPERATIVE DIAGNOSES: 1. Bilateral lumbar radiculopathy and neurogenic claudication 2. L3-4 stenosis 3. L3-4 spondylolisthesis   PROCEDURES: 1. L3-4 decompression, including bilateral partial facetectomy and bilateral lateral recess decompression 2. Left-sided L3-4 transforaminal lumbar interbody fusion 3. Right-sided L3-4 posterolateral fusion 4. Insertion of interbody device x1 (Globus expandable intervertebral spacer) 5. Placement of posterior instrumentation at L3, L4 bilaterally 6. Use of local autograft. 7. Use of morselized allograft - ViviGen 8. Intraoperative use of fluoroscopy   SURGEON:  Oneil Priestly, MD.   ASSISTANTBETHA Ileana Clara, PA-C.   ANESTHESIA:  General endotracheal anesthesia.   COMPLICATIONS:  None.   DISPOSITION:  Stable.   ESTIMATED BLOOD LOSS:   Minimal   INDICATIONS FOR SURGERY:  Briefly, [name] is a pleasant 63 y.o. -year-old Male who did present to me with ongoing pain in the bilateral legs.  He is status post a previous microdiscectomy at L4-5, from which he did well from.  Relative to his more recent symptoms, I did feel that they were secondary to the findings noted above. The patient failed conservative care and did wish to proceed with the procedure  noted above.   OPERATIVE DETAILS:  On 03/16/2024, the patient was brought to surgery and general endotracheal anesthesia was administered.  The patient was placed prone on a well-padded flat Jackson bed with a spinal frame.  Antibiotics were given and a time-out procedure was performed. The back was prepped and draped in the usual fashion.  A midline incision was made  overlying the L3-4 intervertebral space.  The fascia was incised at the midline.  The paraspinal musculature was bluntly swept laterally.  Anatomic landmarks for the pedicles were exposed. Using fluoroscopy, I did cannulate the L3 and L4 pedicles bilaterally, using a medial to lateral cortical trajectory technique.  On the right side, the posterolateral gutter, including the right L3 and L4 transverse processes and right facet joint at L3-4 was decorticated in anticipation of a fusion, and 6 x 30 mm screws were placed and a 45 mm rod was placed and distraction was applied across the rod on the right side.  On the left side, the cannulated pedicle holes were filled with bone wax.    I then proceeded with the decompressive aspect of the procedure.  A partial facetectomy was performed bilaterally, with removal of the ligamentum flavum.  This did decompress the right and left lateral recess.  Then, on the left, a full facetectomy was performed. With an assistant holding medial retraction of the traversing left L4 nerve, I did perform an annulotomy at the posterolateral aspect of the L3-4 intervertebral space.  I then used a series of curettes and pituitary rongeurs to perform a thorough and complete intervertebral diskectomy.  The intervertebral space was then liberally packed with autograft as well as allograft in the form of ViviGen, as was the appropriate-sized intervertebral spacer.  The spacer was then tamped into position in the usual fashion, and expanded to 8.4 mm in height.  I was very pleased with the press-fit of the spacer.  I then placed 6 x 30 mm screws on the left at L3 and L4.  A 45 mm rod was then placed and caps were placed. The distraction was then released on the contralateral right side.  All 4 caps were then locked.  The wound was copiously irrigated with a total of approximately 3 L prior to placing the bone graft.  Additional autograft and allograft were then packed into the  posterolateral gutter on the right side to help aid in the posterolateral L3-4 fusion.  The wound was explored for any undue bleeding and there was no substantial bleeding encountered.  Gel-Foam was placed over the laminectomy site.  The wound was then closed in layers using #1 Vicryl followed by 2-0 Vicryl, followed by 4-0 Monocryl.  Benzoin and Steri-Strips were applied followed by sterile dressing.    Of note, Ileana Clara was my assistant throughout surgery, and did aid in retraction, suctioning, and closure.     Oneil Priestly, MD

## 2024-03-16 NOTE — Transfer of Care (Signed)
 Immediate Anesthesia Transfer of Care Note  Patient: Kenneth Knapp  Procedure(s) Performed: TRANSFORAMINAL LUMBAR INTERBODY FUSION (TLIF) WITH PEDICLE SCREW FIXATION 1 LEVEL (Left)  Patient Location: PACU  Anesthesia Type:General  Level of Consciousness: awake, alert , and oriented  Airway & Oxygen Therapy: Patient Spontanous Breathing and Patient connected to face mask oxygen  Post-op Assessment: Report given to RN, Post -op Vital signs reviewed and stable, and Patient moving all extremities X 4  Post vital signs: Reviewed and stable  Last Vitals:  Vitals Value Taken Time  BP 123/64 03/16/24 13:30  Temp 37.3 C 03/16/24 13:30  Pulse 77 03/16/24 13:39  Resp 17 03/16/24 13:39  SpO2 95 % 03/16/24 13:39  Vitals shown include unfiled device data.  Last Pain:  Vitals:   03/16/24 1330  PainSc: Asleep      Patients Stated Pain Goal: 3 (03/16/24 0904)  Complications: No notable events documented.

## 2024-03-16 NOTE — Anesthesia Postprocedure Evaluation (Signed)
 Anesthesia Post Note  Patient: Kenneth Knapp  Procedure(s) Performed: TRANSFORAMINAL LUMBAR INTERBODY FUSION (TLIF) WITH PEDICLE SCREW FIXATION 1 LEVEL (Left)     Patient location during evaluation: PACU Anesthesia Type: General Level of consciousness: awake and alert Pain management: pain level controlled Vital Signs Assessment: post-procedure vital signs reviewed and stable Respiratory status: spontaneous breathing, nonlabored ventilation and respiratory function stable Cardiovascular status: blood pressure returned to baseline and stable Postop Assessment: no apparent nausea or vomiting Anesthetic complications: no   No notable events documented.  Last Vitals:  Vitals:   03/16/24 1530 03/16/24 1603  BP: (!) 110/51 118/62  Pulse: 72 (!) 58  Resp: 19 18  Temp: 36.9 C 36.6 C  SpO2: 93% 100%    Last Pain:  Vitals:   03/16/24 1603  TempSrc: Oral  PainSc:                  Garnette FORBES Skillern

## 2024-03-17 DIAGNOSIS — M48062 Spinal stenosis, lumbar region with neurogenic claudication: Secondary | ICD-10-CM | POA: Diagnosis not present

## 2024-03-17 MED ORDER — OXYCODONE-ACETAMINOPHEN 5-325 MG PO TABS: 1.0000 | ORAL_TABLET | ORAL | 0 refills | Status: AC | PRN

## 2024-03-17 MED ORDER — METHOCARBAMOL 500 MG PO TABS: 500.0000 mg | ORAL_TABLET | Freq: Four times a day (QID) | ORAL | 2 refills | Status: AC | PRN

## 2024-03-17 MED FILL — Thrombin For Soln 20000 Unit: CUTANEOUS | Qty: 1 | Status: AC

## 2024-03-17 NOTE — Progress Notes (Signed)
 Patient alert and oriented, void, ambulate. Surgical site and dressing clean and dry no sign of infection. D/c instructions explain and given to the patient.

## 2024-03-17 NOTE — Progress Notes (Signed)
    Patient doing well  Denies leg pain   Physical Exam: Vitals:   03/16/24 2305 03/17/24 0341  BP: 131/65 (!) 105/58  Pulse: 73 74  Resp: 20 20  Temp: 98.7 F (37.1 C) 99.4 F (37.4 C)  SpO2: 98% 97%    Dressing in place NVI  POD #1 s/p L3/4 decompression and fusion  - up with PT/OT, encourage ambulation - Percocet for pain, Robaxin  for muscle spasms - likely d/c home today with f/u in 2 weeks

## 2024-03-17 NOTE — Progress Notes (Signed)
 Occupational Therapy Discharge Patient Details Name: SHADEN LACHER MRN: [id number] DOB: March 21, 1961 Today's Date: 03/17/2024 Time:  -     Patient discharged from OT services secondary to no acute needs. OT spoke with patient and pt verbalized feeling no needs. Ot spoke with PT Anessa. .  Please see latest therapy progress note for current level of functioning and progress toward goals.    Progress and discharge plan discussed with patient and/or caregiver: Patient/Caregiver agrees with plan  GO     Ely Molt 03/17/2024, 8:52 AM

## 2024-03-17 NOTE — Evaluation (Signed)
 Physical Therapy Evaluation Patient Details Name: Kenneth Knapp MRN: [id number] DOB: Nov 24, 1960 Today's Date: 03/17/2024  History of Present Illness  Pt is a 63 y.o. male who presented 03/16/24 for elective L3/4 decompression and fusion. PMH: HLD, BPH, R rotator cuff tear  Clinical Impression  Pt presents with condition above and deficits mentioned below, see PT Problem List. PTA, he was independent without DME, living in a 2-level house with 4 STE. Currently, the pt displays continued R anterior tibialis strength deficits, which were present prior to surgery but pt believes is improving already since surgery. He is currently requiring a RW for stability when ambulating, displaying balance deficits when trying to ambulate without UE support. While he is currently requiring a RW to ambulate due to balance concerns, he is only requiring supervision for all functional mobility, including navigating stairs with a step-to pattern and handrail support. His balance will likely improve as his pain and confidence improve, but will recommend HHPT follow-up to address his continued R foot weakness and balance deficits to maximize his safety and independence at d/c. Will continue to follow acutely. Educated pt on car transfers, application of brace and wearing schedule, frequent repositionings, sitting upright </= 45 min at a time, acessing LB for ADLs in sitting in figure 4 position, and taking >/= x3 longer walks per day to tolerance. He verbalized understanding.         If plan is discharge home, recommend the following: Assistance with cooking/housework;Assist for transportation   Can travel by private vehicle        Equipment Recommendations None recommended by PT  Recommendations for Other Services       Functional Status Assessment Patient has had a recent decline in their functional status and demonstrates the ability to make significant improvements in function in a reasonable and predictable  amount of time.     Precautions / Restrictions Precautions Precautions: Fall;Back Precaution Booklet Issued: Yes (comment) Recall of Precautions/Restrictions: Intact Precaution/Restrictions Comments: reviewed precautions Required Braces or Orthoses: Spinal Brace Spinal Brace: Thoracolumbosacral orthotic;Applied in sitting position (can apply seated or standing) Restrictions Weight Bearing Restrictions Per Provider Order: No      Mobility  Bed Mobility               General bed mobility comments: Pt able to verbally describe how to follow spinal precautions with bed mobility    Transfers Overall transfer level: Needs assistance Equipment used: Rolling walker (2 wheels) Transfers: Sit to/from Stand Sit to Stand: Supervision           General transfer comment: Extra time and effort to stand from recliner, cuing pt to scoot to edge and use arm rests, no LOB, supervision for safety    Ambulation/Gait Ambulation/Gait assistance: Supervision Gait Distance (Feet): 340 Feet Assistive device: Rolling walker (2 wheels), None (wall support) Gait Pattern/deviations: Step-through pattern, Decreased dorsiflexion - right, Decreased stride length Gait velocity: reduced Gait velocity interpretation: <1.8 ft/sec, indicate of risk for recurrent falls   General Gait Details: Pt ambulates with intermittent poor R ankle dorsiflexion eccentric control (foot slap). No LOB when using RW, supervision for safety. When trying to ambulate without AD, pt was unsteady and reached out to the wall for support, supervision for safety  Stairs Stairs: Yes Stairs assistance: Supervision Stair Management: One rail Right, Step to pattern, Sideways, Forwards Number of Stairs: 10 General stair comments: Ascends forward with R rail and step-to pattern, no LOB. Descends sideways with bil hands on R rail with  step-to pattern, needing cues to make sure he stepped down with first foot more proximal to the  bottom step to ensure enough space for his other foot to step down. No LOB. Supervision for safety  Wheelchair Mobility     Tilt Bed    Modified Rankin (Stroke Patients Only)       Balance Overall balance assessment: Needs assistance Sitting-balance support: No upper extremity supported, Feet supported Sitting balance-Leahy Scale: Good Sitting balance - Comments: Able to obtain figure 4 position and reach to toe without LOB or assistance   Standing balance support: Bilateral upper extremity supported, Single extremity supported, No upper extremity supported, During functional activity Standing balance-Leahy Scale: Fair Standing balance comment: Able to stand statically without UE support but reliant on at least 1 UE support to ambulate                             Pertinent Vitals/Pain Pain Assessment Pain Assessment: Faces Faces Pain Scale: Hurts even more Pain Location: back Pain Descriptors / Indicators: Discomfort, Grimacing, Operative site guarding Pain Intervention(s): Limited activity within patient's tolerance, Monitored during session, Repositioned    Home Living Family/patient expects to be discharged to:: Private residence Living Arrangements: Spouse/significant other Available Help at Discharge: Family;Available 24 hours/day (for the initial few days) Type of Home: House Home Access: Stairs to enter Entrance Stairs-Rails: Right;Left Entrance Stairs-Number of Steps: 4 Alternate Level Stairs-Number of Steps: flight Home Layout: Two level;Bed/bath upstairs Home Equipment: Agricultural consultant (2 wheels);Shower seat      Prior Function Prior Level of Function : Independent/Modified Independent;Driving;Working/employed             Mobility Comments: No AD ADLs Comments: Works as Web designer Extremity Assessment: Overall WFL for tasks assessed    Lower Extremity Assessment Lower  Extremity Assessment: RLE deficits/detail (MMT scores of 5/5 grossly on L; denied numbness/tingling bil) RLE Deficits / Details: R leg weaker prior to surgery per pt; currently MMT score of 5/5 quads, 4-/5 anterior tibialis; denied numbness/tingling pre and post surgery    Cervical / Trunk Assessment Cervical / Trunk Assessment: Back Surgery  Communication   Communication Communication: No apparent difficulties    Cognition Arousal: Alert Behavior During Therapy: WFL for tasks assessed/performed   PT - Cognitive impairments: No apparent impairments                         Following commands: Intact       Cueing Cueing Techniques: Verbal cues     General Comments General comments (skin integrity, edema, etc.): Educated pt on car transfers, application of brace and wearing schedule, frequent repositionings, sitting upright </= 45 min at a time, acessing LB for ADLs in sitting in figure 4 position, and taking >/= x3 longer walks per day to tolerance. He verbalized understanding.    Exercises     Assessment/Plan    PT Assessment Patient needs continued PT services  PT Problem List Decreased strength;Decreased activity tolerance;Decreased balance;Decreased mobility;Pain       PT Treatment Interventions DME instruction;Gait training;Stair training;Functional mobility training;Therapeutic activities;Therapeutic exercise;Balance training;Neuromuscular re-education;Patient/family education    PT Goals (Current goals can be found in the Care Plan section)  Acute Rehab PT Goals Patient Stated Goal: to continue to improve PT Goal Formulation: With patient Time For Goal Achievement: 03/24/24 Potential to Achieve Goals: Good  Frequency Min 5X/week     Co-evaluation               AM-PAC PT 6 Clicks Mobility  Outcome Measure Help needed turning from your back to your side while in a flat bed without using bedrails?: None Help needed moving from lying on your  back to sitting on the side of a flat bed without using bedrails?: None Help needed moving to and from a bed to a chair (including a wheelchair)?: A Little Help needed standing up from a chair using your arms (e.g., wheelchair or bedside chair)?: A Little Help needed to walk in hospital room?: A Little Help needed climbing 3-5 steps with a railing? : A Little 6 Click Score: 20    End of Session Equipment Utilized During Treatment: Back brace Activity Tolerance: Patient tolerated treatment well Patient left: in chair;with call bell/phone within reach Nurse Communication: Mobility status PT Visit Diagnosis: Unsteadiness on feet (R26.81);Other abnormalities of gait and mobility (R26.89);Muscle weakness (generalized) (M62.81);Difficulty in walking, not elsewhere classified (R26.2);Other symptoms and signs involving the nervous system (R29.898);Pain Pain - Right/Left:  (back) Pain - part of body:  (back)    Time: 9258-9240 PT Time Calculation (min) (ACUTE ONLY): 18 min   Charges:   PT Evaluation $PT Eval Low Complexity: 1 Low   PT General Charges $$ ACUTE PT VISIT: 1 Visit         Theo Ferretti, PT, DPT Acute Rehabilitation Services  Office: [phone]   Theo CHRISTELLA Ferretti 03/17/2024, 8:23 AM

## 2024-03-22 ENCOUNTER — Encounter (HOSPITAL_COMMUNITY): Payer: Self-pay | Admitting: Orthopedic Surgery

## 2024-03-29 NOTE — Discharge Summary (Signed)
 Patient ID: Kenneth Knapp MRN: [id number] DOB/AGE: Oct 06, 1960 64 y.o.  Admit date: 03/16/2024 Discharge date: 03/17/2024  Admission Diagnoses:  Principal Problem:   Neurogenic claudication   Discharge Diagnoses:  Same  Past Medical History:  Diagnosis Date   AR (allergic rhinitis)    BPH (benign prostatic hyperplasia)    ED (erectile dysfunction)    History of echocardiogram    04-15-2016  mild LVH, ef 60-65%   History of exercise stress test (03-22-2019 per pt had not any cardiac s&s since )   04-10-2016 (cardiology evaluation for precordial pain by dr t. burnard)  Low risk w/ low specificity (mild positive ekg changes at very high workload and brisk return to normal)   Hyperlipidemia    Hypogonadism male    Rotator cuff tear, right    Wears glasses     Surgeries: Procedure(s): TRANSFORAMINAL LUMBAR INTERBODY FUSION (TLIF) WITH PEDICLE SCREW FIXATION 1 LEVEL on 03/16/2024   Consultants: None  Discharged Condition: Improved  Hospital Course: Kenneth Knapp is an 63 y.o. male who was admitted 03/16/2024 for operative treatment of Neurogenic claudication. Patient has severe unremitting pain that affects sleep, daily activities, and work/hobbies. After pre-op clearance the patient was taken to the operating room on 03/16/2024 and underwent  Procedure(s): TRANSFORAMINAL LUMBAR INTERBODY FUSION (TLIF) WITH PEDICLE SCREW FIXATION 1 LEVEL.    Patient was given perioperative antibiotics:  Anti-infectives (From admission, onward)    Start     Dose/Rate Route Frequency Ordered Stop   03/16/24 1800  ceFAZolin  (ANCEF ) IVPB 2g/100 mL premix        2 g 200 mL/hr over 30 Minutes Intravenous Every 8 hours 03/16/24 1541 03/17/24 0735   03/16/24 0815  ceFAZolin  (ANCEF ) IVPB 2g/100 mL premix        2 g 200 mL/hr over 30 Minutes Intravenous On call to O.R. 03/16/24 9185 03/16/24 1049        Patient was given sequential compression devices, early ambulation to prevent  DVT.  Patient benefited maximally from hospital stay and there were no complications.    Recent vital signs: BP 115/67 (BP Location: Left Arm)   Pulse 69   Temp 98.5 F (36.9 C) (Oral)   Resp 20   Ht 5' 10 (1.778 m)   Wt 76.2 kg   SpO2 95%   BMI 24.11 kg/m    Discharge Medications:   Allergies as of 03/17/2024   No Known Allergies      Medication List     TAKE these medications    acetaminophen  500 MG tablet Commonly known as: TYLENOL  Take 500-1,000 mg by mouth every 6 (six) hours as needed (pain.).   CO Q-10 PO Take 1 capsule by mouth in the morning.   EPINEPHrine  0.3 mg/0.3 mL Soaj injection Commonly known as: EPI-PEN Inject 0.3 mg into the muscle as needed for anaphylaxis. Use as needed   fexofenadine 180 MG tablet Commonly known as: ALLEGRA Take 180 mg by mouth at bedtime.   finasteride  1 MG tablet Commonly known as: PROPECIA  Take 1 mg by mouth every evening.   lovastatin  20 MG tablet Commonly known as: MEVACOR  TAKE 1 TABLET BY MOUTH EVERYDAY AT BEDTIME What changed:  how much to take how to take this when to take this additional instructions   MAGNESIUM  PO Take 1 tablet by mouth in the morning and at bedtime.   methocarbamol  500 MG tablet Commonly known as: ROBAXIN  Take 500 mg by mouth every 6 (six) hours as  needed for muscle spasms. What changed: Another medication with the same name was added. Make sure you understand how and when to take each.   methocarbamol  500 MG tablet Commonly known as: ROBAXIN  Take 1-2 tablets (500-1,000 mg total) by mouth every 6 (six) hours as needed for muscle spasms. What changed: You were already taking a medication with the same name, and this prescription was added. Make sure you understand how and when to take each.   Milk Thistle Caps Take 1 capsule by mouth every evening.   multivitamin tablet Take 1 tablet by mouth in the morning.   oxyCODONE -acetaminophen  5-325 MG tablet Commonly known as:  PERCOCET/ROXICET Take 1-2 tablets by mouth every 4 (four) hours as needed for severe pain (pain score 7-10).   PRESCRIPTION MEDICATION ALLERGY INJECTIONS WEEKLY   sildenafil  50 MG tablet Commonly known as: VIAGRA  TAKE 1 TABLET BY MOUTH 1 HOUR PRIOR TO SEXUAL ACTIVITY   testosterone  cypionate 200 MG/ML injection Commonly known as: DEPOTESTOSTERONE CYPIONATE INJECT 1.5 ML (300 MG) INTO THE MUSCLE EVERY 14 DAYS What changed: See the new instructions.   traMADol 50 MG tablet Commonly known as: ULTRAM Take 50 mg by mouth every 6 (six) hours as needed (pain.).   traZODone  150 MG tablet Commonly known as: DESYREL  Take 1 tablet (150 mg total) by mouth at bedtime.   TURMERIC PO Take 1 capsule by mouth daily.   VITAMIN B-12 PO Take 1 tablet by mouth every evening.   VITAMIN D -3 PO Take 1 tablet by mouth in the morning.   zaleplon  5 MG capsule Commonly known as: SONATA  Take 1 capsule (5 mg total) by mouth at bedtime as needed for sleep.        Diagnostic Studies: DG Lumbar Spine 1 View Result Date: 03/16/2024 CLINICAL DATA:  Surgical posterior fusion of lumbar spine. EXAM: LUMBAR SPINE - 1 VIEW COMPARISON:  MRI September 08, 2023. FINDINGS: Single intraoperative cross-table lateral projection of the lumbar spine was obtained. This demonstrates surgical probes directed toward the posterior spinous processes of L2 and L3. IMPRESSION: Surgical localization as described above. Electronically Signed   By: Lynwood Landy Raddle M.D.   On: 03/16/2024 14:49   DG Lumbar Spine 2-3 Views Result Date: 03/16/2024 CLINICAL DATA:  461500 Elective surgery 461500. EXAM: LUMBAR SPINE - 2-3 VIEW COMPARISON:  None Available. FINDINGS: Two spot radiographs during lumbar spinal fixation surgery are provided. There is intervertebral disc spacer as well. Please refer to the separately issued operative report for further details. Fluoroscopy time: 38.3 seconds Radiation dose: 22.37 mGy IMPRESSION: Intraoperative  fluoroscopy provided during lumbar spinal fixation surgery. Electronically Signed   By: Ree Molt M.D.   On: 03/16/2024 13:32   DG C-Arm 1-60 Min-No Report Result Date: 03/16/2024 Fluoroscopy was utilized by the requesting physician.  No radiographic interpretation.   DG C-Arm 1-60 Min-No Report Result Date: 03/16/2024 Fluoroscopy was utilized by the requesting physician.  No radiographic interpretation.   DG C-Arm 1-60 Min-No Report Result Date: 03/16/2024 Fluoroscopy was utilized by the requesting physician.  No radiographic interpretation.    Disposition: Discharge disposition: 01-Home or Self Care        POD #1 s/p L3/4 decompression and fusion   - up with PT/OT, encourage ambulation - Percocet for pain, Robaxin  for muscle spasms -Scripts for pain sent to pharmacy electronically  -D/C instructions sheet printed and in chart -D/C today  -F/U in office 2 weeks   Signed: Ileana PARAS Keimora Swartout 03/29/2024, 12:58 PM

## 2024-04-20 ENCOUNTER — Other Ambulatory Visit: Payer: Self-pay | Admitting: Family Medicine

## 2024-06-10 ENCOUNTER — Other Ambulatory Visit: Payer: Self-pay | Admitting: Family Medicine

## 2024-07-02 ENCOUNTER — Other Ambulatory Visit: Payer: Self-pay | Admitting: Family Medicine

## 2024-07-26 ENCOUNTER — Other Ambulatory Visit: Payer: Self-pay | Admitting: Family Medicine

## 2024-07-27 ENCOUNTER — Other Ambulatory Visit: Payer: Self-pay | Admitting: Family Medicine

## 2024-08-01 ENCOUNTER — Telehealth: Payer: Self-pay

## 2024-08-01 ENCOUNTER — Other Ambulatory Visit (HOSPITAL_COMMUNITY): Payer: Self-pay

## 2024-08-01 NOTE — Telephone Encounter (Signed)
 Left message for the patient to return my call.

## 2024-08-01 NOTE — Telephone Encounter (Signed)
 Prior Authorization form/request asks a question that requires your assistance. Please see the question below and advise accordingly. The PA will not be submitted until the necessary information is received.Pharmacy Patient Advocate Encounter  PLEASE HELP ME WITH QUESTION DO NOT SEE ANYTHING IN CHART .   For continuation of therapy, please provide the follow-up serum testosterone  LEVEL WITHIN THE PAST 12 MONTHS, including date and lab reference range: LEVEL:         DATE:       REFERENCE:    Received notification from Onbase CMM KEY that prior authorization for testosterone  cypionate (DEPOTESTOSTERONE CYPIONATE) 200 MG/ML injection is required/requested.   Insurance verification completed.   The patient is insured through [employer].   Per test claim: PA required; PA submitted to above mentioned insurance via Latent Key/confirmation #/EOC [id number] Status is pending

## 2024-08-02 ENCOUNTER — Telehealth: Payer: Self-pay

## 2024-08-02 NOTE — Telephone Encounter (Signed)
 Noted

## 2024-08-02 NOTE — Telephone Encounter (Signed)
 Copied from CRM [id number]. Topic: General - Call Back - No Documentation >> Aug 01, 2024  5:23 PM Lauren C wrote: Reason for CRM: Pt returning call to Kayte Borchard L, CMA. He will ask his nurse to see if she can draw testosterone  labs this Thursday and will let us  know if she is not able to.

## 2024-08-02 NOTE — Telephone Encounter (Signed)
 By: Lindajo Tinnie SAUNDERS Reason for CRM: Pt returning call to Jordain Radin L, CMA. He will ask his nurse to see if she can draw testosterone  labs this Thursday and will let us  know if she is not able to.

## 2024-08-17 ENCOUNTER — Encounter: Payer: Self-pay | Admitting: Family Medicine

## 2024-08-18 ENCOUNTER — Other Ambulatory Visit (HOSPITAL_COMMUNITY): Payer: Self-pay

## 2024-08-22 ENCOUNTER — Other Ambulatory Visit (HOSPITAL_COMMUNITY): Payer: Self-pay
